# Patient Record
Sex: Male | Born: 1965 | Race: Black or African American | Hispanic: No | Marital: Single | Smoking: Current some day smoker
Health system: Southern US, Community
[De-identification: ages and names within clinical notes are randomized; demographics above are authoritative.]

## PROBLEM LIST (undated history)

## (undated) DIAGNOSIS — R569 Unspecified convulsions: Secondary | ICD-10-CM

## (undated) DIAGNOSIS — K219 Gastro-esophageal reflux disease without esophagitis: Secondary | ICD-10-CM

## (undated) DIAGNOSIS — C801 Malignant (primary) neoplasm, unspecified: Secondary | ICD-10-CM

## (undated) DIAGNOSIS — G4733 Obstructive sleep apnea (adult) (pediatric): Secondary | ICD-10-CM

## (undated) DIAGNOSIS — I10 Essential (primary) hypertension: Secondary | ICD-10-CM

## (undated) DIAGNOSIS — G4730 Sleep apnea, unspecified: Secondary | ICD-10-CM

## (undated) DIAGNOSIS — R05 Cough: Secondary | ICD-10-CM

## (undated) DIAGNOSIS — E785 Hyperlipidemia, unspecified: Secondary | ICD-10-CM

## (undated) DIAGNOSIS — E119 Type 2 diabetes mellitus without complications: Secondary | ICD-10-CM

## (undated) HISTORY — DX: Gastro-esophageal reflux disease without esophagitis: K21.9

## (undated) HISTORY — PX: TESTICLE REMOVAL: SHX68

## (undated) HISTORY — DX: Hyperlipidemia, unspecified: E78.5

## (undated) HISTORY — DX: Type 2 diabetes mellitus without complications: E11.9

## (undated) HISTORY — DX: Unspecified convulsions: R56.9

---
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
## 1998-04-30 ENCOUNTER — Emergency Department (HOSPITAL_COMMUNITY): Admission: EM | Admit: 1998-04-30 | Discharge: 1998-04-30 | Payer: Self-pay | Admitting: Emergency Medicine

## 2009-10-05 ENCOUNTER — Emergency Department (HOSPITAL_COMMUNITY): Admission: EM | Admit: 2009-10-05 | Discharge: 2009-10-05 | Payer: Self-pay | Admitting: Emergency Medicine

## 2017-09-20 ENCOUNTER — Emergency Department (HOSPITAL_COMMUNITY): Payer: Self-pay

## 2017-09-20 ENCOUNTER — Observation Stay (HOSPITAL_COMMUNITY)
Admission: EM | Admit: 2017-09-20 | Discharge: 2017-09-21 | Disposition: A | Discharge status: Home / Self Care | Payer: Self-pay | Attending: Family Medicine | Admitting: Family Medicine

## 2017-09-20 ENCOUNTER — Encounter (HOSPITAL_COMMUNITY): Payer: Self-pay | Admitting: Emergency Medicine

## 2017-09-20 ENCOUNTER — Other Ambulatory Visit: Payer: Self-pay

## 2017-09-20 DIAGNOSIS — G4733 Obstructive sleep apnea (adult) (pediatric): Secondary | ICD-10-CM

## 2017-09-20 DIAGNOSIS — R600 Localized edema: Secondary | ICD-10-CM | POA: Insufficient documentation

## 2017-09-20 DIAGNOSIS — F17210 Nicotine dependence, cigarettes, uncomplicated: Secondary | ICD-10-CM | POA: Insufficient documentation

## 2017-09-20 DIAGNOSIS — R400 Somnolence: Secondary | ICD-10-CM

## 2017-09-20 DIAGNOSIS — Z880 Allergy status to penicillin: Secondary | ICD-10-CM | POA: Insufficient documentation

## 2017-09-20 DIAGNOSIS — F1010 Alcohol abuse, uncomplicated: Secondary | ICD-10-CM

## 2017-09-20 DIAGNOSIS — Z8547 Personal history of malignant neoplasm of testis: Secondary | ICD-10-CM | POA: Insufficient documentation

## 2017-09-20 DIAGNOSIS — R0789 Other chest pain: Principal | ICD-10-CM | POA: Diagnosis present

## 2017-09-20 DIAGNOSIS — I10 Essential (primary) hypertension: Secondary | ICD-10-CM

## 2017-09-20 DIAGNOSIS — Z6841 Body Mass Index (BMI) 40.0 and over, adult: Secondary | ICD-10-CM | POA: Insufficient documentation

## 2017-09-20 DIAGNOSIS — F17200 Nicotine dependence, unspecified, uncomplicated: Secondary | ICD-10-CM

## 2017-09-20 DIAGNOSIS — F1410 Cocaine abuse, uncomplicated: Secondary | ICD-10-CM

## 2017-09-20 DIAGNOSIS — E119 Type 2 diabetes mellitus without complications: Secondary | ICD-10-CM | POA: Insufficient documentation

## 2017-09-20 DIAGNOSIS — E785 Hyperlipidemia, unspecified: Secondary | ICD-10-CM | POA: Insufficient documentation

## 2017-09-20 HISTORY — DX: Somnolence: R40.0

## 2017-09-20 HISTORY — DX: Malignant (primary) neoplasm, unspecified: C80.1

## 2017-09-20 HISTORY — DX: Sleep apnea, unspecified: G47.30

## 2017-09-20 HISTORY — DX: Obstructive sleep apnea (adult) (pediatric): G47.33

## 2017-09-20 HISTORY — DX: Essential (primary) hypertension: I10

## 2017-09-20 HISTORY — DX: Other chest pain: R07.89

## 2017-09-20 LAB — CBC
HEMATOCRIT: 47.9 % (ref 39.0–52.0)
HEMOGLOBIN: 16 g/dL (ref 13.0–17.0)
MCH: 31.3 pg (ref 26.0–34.0)
MCHC: 33.4 g/dL (ref 30.0–36.0)
MCV: 93.7 fL (ref 78.0–100.0)
PLATELETS: 179 10*3/uL (ref 150–400)
RBC: 5.11 MIL/uL (ref 4.22–5.81)
RDW: 13.2 % (ref 11.5–15.5)
WBC: 7.3 10*3/uL (ref 4.0–10.5)

## 2017-09-20 LAB — TSH: TSH: 2.305 u[IU]/mL (ref 0.350–4.500)

## 2017-09-20 LAB — HEPATIC FUNCTION PANEL
ALK PHOS: 86 U/L (ref 38–126)
ALT: 36 U/L (ref 0–44)
AST: 39 U/L (ref 15–41)
Albumin: 3.7 g/dL (ref 3.5–5.0)
BILIRUBIN DIRECT: 0.2 mg/dL (ref 0.0–0.2)
BILIRUBIN INDIRECT: 0.5 mg/dL (ref 0.3–0.9)
TOTAL PROTEIN: 6.7 g/dL (ref 6.5–8.1)
Total Bilirubin: 0.7 mg/dL (ref 0.3–1.2)

## 2017-09-20 LAB — LIPID PANEL
CHOL/HDL RATIO: 4.2 ratio
CHOLESTEROL: 180 mg/dL (ref 0–200)
HDL: 43 mg/dL (ref >40)
LDL Cholesterol: 123 mg/dL — ABNORMAL HIGH (ref 0–99)
Triglycerides: 69 mg/dL (ref <150)
VLDL: 14 mg/dL (ref 0–40)

## 2017-09-20 LAB — HEMOGLOBIN A1C
Hgb A1c MFr Bld: 6.7 % — ABNORMAL HIGH (ref 4.8–5.6)
MEAN PLASMA GLUCOSE: 145.59 mg/dL

## 2017-09-20 LAB — PROTEIN / CREATININE RATIO, URINE
Creatinine, Urine: 109.3 mg/dL
Protein Creatinine Ratio: 0.06 mg/mg{Cre} (ref 0.00–0.15)
TOTAL PROTEIN, URINE: 7 mg/dL

## 2017-09-20 LAB — I-STAT TROPONIN, ED: Troponin i, poc: 0 ng/mL (ref 0.00–0.08)

## 2017-09-20 LAB — BASIC METABOLIC PANEL
Anion gap: 9 (ref 5–15)
BUN: 13 mg/dL (ref 6–20)
CHLORIDE: 109 mmol/L (ref 98–111)
CO2: 22 mmol/L (ref 22–32)
CREATININE: 1.26 mg/dL — AB (ref 0.61–1.24)
Calcium: 8.7 mg/dL — ABNORMAL LOW (ref 8.9–10.3)
GFR calc non Af Amer: >60 mL/min (ref >60)
Glucose, Bld: 108 mg/dL — ABNORMAL HIGH (ref 70–99)
Potassium: 4.2 mmol/L (ref 3.5–5.1)
Sodium: 140 mmol/L (ref 135–145)

## 2017-09-20 LAB — BRAIN NATRIURETIC PEPTIDE: B Natriuretic Peptide: 15.2 pg/mL (ref 0.0–100.0)

## 2017-09-20 LAB — TROPONIN I
Troponin I: <0.03 ng/mL (ref <0.03)
Troponin I: <0.03 ng/mL (ref <0.03)
Troponin I: <0.03 ng/mL (ref <0.03)

## 2017-09-20 LAB — RAPID URINE DRUG SCREEN, HOSP PERFORMED
AMPHETAMINES: NOT DETECTED
BARBITURATES: NOT DETECTED
BENZODIAZEPINES: NOT DETECTED
COCAINE: POSITIVE — AB
Opiates: NOT DETECTED
TETRAHYDROCANNABINOL: NOT DETECTED

## 2017-09-20 LAB — LIPASE, BLOOD: Lipase: 34 U/L (ref 11–51)

## 2017-09-20 LAB — MRSA PCR SCREENING: MRSA by PCR: NEGATIVE

## 2017-09-20 MED ORDER — HYDROCHLOROTHIAZIDE 10 MG/ML ORAL SUSPENSION
6.2500 mg | Freq: Every day | ORAL | Status: DC
  Administered 2017-09-20 – 2017-09-21 (×2): 6.25 mg via ORAL
  Filled 2017-09-20 (×3): qty 1.25

## 2017-09-20 MED ORDER — NITROGLYCERIN 0.4 MG SL SUBL: 0.4000 mg | SUBLINGUAL_TABLET | SUBLINGUAL | Status: DC | PRN

## 2017-09-20 MED ORDER — CALCIUM CARBONATE ANTACID 500 MG PO CHEW
200.0000 mg | CHEWABLE_TABLET | Freq: Two times a day (BID) | ORAL | Status: DC
  Administered 2017-09-20 – 2017-09-21 (×2): 200 mg via ORAL
  Filled 2017-09-20 (×2): qty 1

## 2017-09-20 MED ORDER — HYDRALAZINE HCL 20 MG/ML IJ SOLN
5.0000 mg | INTRAMUSCULAR | Status: DC | PRN
  Administered 2017-09-20: 5 mg via INTRAVENOUS
  Filled 2017-09-20: qty 1

## 2017-09-20 MED ORDER — ADULT MULTIVITAMIN W/MINERALS CH
1.0000 | ORAL_TABLET | Freq: Every day | ORAL | Status: DC
  Administered 2017-09-20 – 2017-09-21 (×2): 1 via ORAL
  Filled 2017-09-20 (×2): qty 1

## 2017-09-20 MED ORDER — FOLIC ACID 1 MG PO TABS
1.0000 mg | ORAL_TABLET | Freq: Every day | ORAL | Status: DC
  Administered 2017-09-20 – 2017-09-21 (×2): 1 mg via ORAL
  Filled 2017-09-20 (×2): qty 1

## 2017-09-20 MED ORDER — VITAMIN B-1 100 MG PO TABS
100.0000 mg | ORAL_TABLET | Freq: Every day | ORAL | Status: DC
  Administered 2017-09-20 – 2017-09-21 (×2): 100 mg via ORAL
  Filled 2017-09-20 (×2): qty 1

## 2017-09-20 MED ORDER — ACETAMINOPHEN 325 MG PO TABS
650.0000 mg | ORAL_TABLET | ORAL | Status: DC | PRN
  Administered 2017-09-20: 650 mg via ORAL
  Filled 2017-09-20: qty 2

## 2017-09-20 MED ORDER — LORAZEPAM 1 MG PO TABS: 1.0000 mg | ORAL_TABLET | Freq: Four times a day (QID) | ORAL | Status: DC | PRN

## 2017-09-20 MED ORDER — ATORVASTATIN CALCIUM 20 MG PO TABS
20.0000 mg | ORAL_TABLET | Freq: Every day | ORAL | Status: DC
  Administered 2017-09-20 – 2017-09-21 (×2): 20 mg via ORAL
  Filled 2017-09-20 (×2): qty 1

## 2017-09-20 MED ORDER — THIAMINE HCL 100 MG/ML IJ SOLN
100.0000 mg | Freq: Every day | INTRAMUSCULAR | Status: DC
  Filled 2017-09-20 (×2): qty 2

## 2017-09-20 MED ORDER — ONDANSETRON HCL 4 MG/2ML IJ SOLN: 4.0000 mg | Freq: Four times a day (QID) | INTRAMUSCULAR | Status: DC | PRN

## 2017-09-20 MED ORDER — ASPIRIN 81 MG PO CHEW
324.0000 mg | CHEWABLE_TABLET | Freq: Once | ORAL | Status: DC
  Filled 2017-09-20: qty 4

## 2017-09-20 MED ORDER — IPRATROPIUM-ALBUTEROL 0.5-2.5 (3) MG/3ML IN SOLN: 3.0000 mL | Freq: Four times a day (QID) | RESPIRATORY_TRACT | Status: DC | PRN

## 2017-09-20 MED ORDER — ACETAMINOPHEN 325 MG PO TABS
650.0000 mg | ORAL_TABLET | Freq: Three times a day (TID) | ORAL | Status: DC
  Administered 2017-09-20 – 2017-09-21 (×3): 650 mg via ORAL
  Filled 2017-09-20 (×3): qty 2

## 2017-09-20 MED ORDER — ASPIRIN EC 325 MG PO TBEC
325.0000 mg | DELAYED_RELEASE_TABLET | Freq: Every day | ORAL | Status: DC
  Administered 2017-09-21: 325 mg via ORAL
  Filled 2017-09-20: qty 1

## 2017-09-20 MED ORDER — ENOXAPARIN SODIUM 40 MG/0.4ML ~~LOC~~ SOLN
40.0000 mg | SUBCUTANEOUS | Status: DC
  Administered 2017-09-20 – 2017-09-21 (×2): 40 mg via SUBCUTANEOUS
  Filled 2017-09-20 (×2): qty 0.4

## 2017-09-20 MED ORDER — NICOTINE 14 MG/24HR TD PT24
14.0000 mg | MEDICATED_PATCH | Freq: Every day | TRANSDERMAL | Status: DC
  Administered 2017-09-20 – 2017-09-21 (×2): 14 mg via TRANSDERMAL
  Filled 2017-09-20 (×2): qty 1

## 2017-09-20 MED ORDER — GI COCKTAIL ~~LOC~~: 30.0000 mL | Freq: Four times a day (QID) | ORAL | Status: DC | PRN

## 2017-09-20 MED ORDER — LORAZEPAM 2 MG/ML IJ SOLN: 1.0000 mg | Freq: Four times a day (QID) | INTRAMUSCULAR | Status: DC | PRN

## 2017-09-20 MED ORDER — OXYCODONE HCL 5 MG PO TABS: 2.5000 mg | ORAL_TABLET | ORAL | Status: DC | PRN

## 2017-09-20 NOTE — H&P (Addendum)
[facility] Admission History and Physical Service Pager: [phone]  Patient name: Dillon Wang Medical record number: [id number] Date of birth: February 02, 1966 Age: 52 y.o. Gender: male  Primary Care Provider: System, Pcp Not In Consultants:  Code Status: full  Chief Complaint: atypical Chest pain  Assessment and Plan: Dillon Wang is a 52 y.o. male presenting with left-sided chest pain. PMH is significant for cocaine use, hypertension, testicular cancer status post orchiectomy without chemotherapy.  Atypical chest pain Chest wall pain is reproducible with palpation following cocaine use.  Vital signs are significant for hypertension up to systolics of 831 and respirations up to 30.  Some coarse breath sounds and lower extremity edema on physical exam chest pain was reproducible on exam with palpation of the left costal margin.  No reported changes on exertion but pain with position change coughing.  EKG and chest x-ray were unremarkable.  First troponin is negative, BNP: 15.  DDX includes: Musculoskeletal pain, vasospasm secondary to cocaine use, NSTEMI, heart failure, pneumonia. Presentation and work-up thus far are most consistent with musculoskeletal pain but would like to monitor on telemetry and get a TTE due to high risk for CAD/heart failure. - admit to Telemetry for obs, Dr. McDiarmid attending - Trend troponins - Repeat EKG - A1c, TSH, lipid panel - Monitor LFTs, bilirubin, lipase - TTE - Monitor vitals, up with assistance  Cocaine/substance use Patient admits to using cocaine frequently last use was last night.  Patient denies other illicit substances. - UDS - Cautious use of beta-blockers  Alcohol use Patient reports drinking one sixpack of beer per day.  Patient reports last drink was last night. -CIWA protocol - Monitor liver enzymes -Thiamine  AKI versus CKD Admission labs show a creatinine of 1.26, baseline unclear.  Creatinine from ED  visit in May/2019 was 1.3.  Based on available data this may well be his baseline. -Monitor creatinine -consider nephrology consult if worsening -Avoid nephrotoxic agents -Avoid NSAIDs if possible -urine protein creatinine ratio  Nicotine use Patient reports smoking half a pack of cigarettes per day. -Nicotine patch -duoneb q6 prn  OSA Patient reports a history of OSA and does have a CPAP at home.  He reports that he has not been using his CPAP recently because his mask is getting old and does not work well. - CPAP use in hospital -d/c summary to suggest new CPAP DME order from pcp  No pcp: no insurance -consult SW for help finding pcp  FEN/GI: Heart healthy Prophylaxis: Lovenox  Disposition: Admit to GenSurg  History of Present Illness:  Dillon Wang is a 52 y.o. male presenting with pain.  He has a previous medical history significant for cocaine use, hypertension testicular cancer status post orchiectomy without chemotherapy.  He is to [place] began to experience stabbing chest pain in his left chest last night following cocaine use.  The pain is described as 10 out of 10 stabbing to his left pec without radiation.  He noticed that it was worse when he leaned forward and while he coughed.  It is not related to exertion and he has not had any food intake since the pain began.  During transportation with EMS to the hospital he was giving a medication significantly helped reduce the pain.  His girlfriend noted that in the past year he has had at least 2 episodes of passing out.  One was certainly related to cocaine/drug use the other is unclear.  She also reports that he has had more  difficulty breathing while lying down for about the past month.  He does have a diagnosis of OSA with a CPAP at home that he does not use because the mask is old.  Girlfriend also pointed out that his lower extremity edema has worsened in the past 1 to 2 months.  Dillon Wang reports reports a cocaine habit of  about $20 today in addition to drinking one sixpack of beer per day and smoking about 1/2 pack of cigarettes per day.  Patient was recently diagnosed with hypertension at an ED visit.  Was prescribed lisinopril to begin taking at home.  He has been inconsistent with taking this new medication does not know if it in fact helps his hypertension.   Review Of Systems: Per HPI with the following additions:   Review of Systems  Constitutional: Positive for malaise/fatigue. Negative for chills, fever and weight loss.  Eyes: Negative for discharge and redness.  Respiratory: Positive for cough and shortness of breath.   Cardiovascular: Positive for chest pain, orthopnea and leg swelling. Negative for palpitations.  Gastrointestinal: Negative for abdominal pain, blood in stool, constipation, diarrhea, nausea and vomiting.  Genitourinary: Negative for dysuria.  Musculoskeletal: Negative for myalgias.  Neurological: Positive for loss of consciousness. Negative for tremors.    Patient Active Problem List   Diagnosis Date Noted  . Atypical chest pain 09/20/2017    Past Medical History: Past Medical History:  Diagnosis Date  . Cancer [facility])    prostate  . Hypertension   . Sleep apnea     Past Surgical History: History reviewed. No pertinent surgical history.  Social History: Social History   Tobacco Use  . Smoking status: Current Every Day Smoker  . Smokeless tobacco: Never Used  Substance Use Topics  . Alcohol use: Yes    Comment: daily  . Drug use: Yes    Types: Cocaine    Comment: yesterday (09/19/17   Additional social history: Patient was interviewed with his girlfriend of 10 months Please also refer to relevant sections of EMR.  Family History: No family history on file. No family history of CAD or heart failure  Allergies and Medications: Allergies  Allergen Reactions  . Penicillins    No current facility-administered medications on file prior to encounter.     Current Outpatient Medications on File Prior to Encounter  Medication Sig Dispense Refill  . lisinopril (PRINIVIL,ZESTRIL) 20 MG tablet Take 20 mg by mouth daily.      Objective: BP (!) 175/95   Pulse 77   Temp 97.7 F (36.5 C) (Temporal)   Resp 16   Ht 5\' 9"  (1.753 m)   Wt 280 lb (127 kg)   SpO2 92%   BMI 41.35 kg/m  Exam: Physical Exam  Constitutional: He appears well-developed and well-nourished.  Non-toxic appearance. He appears distressed.  Patient did appear fatigued, frequently closing his eyes during the interview and leaving long pauses during which his girlfriend would complete his answers or add pertinent information.  HENT:  Head: Normocephalic and atraumatic.  Cardiovascular: Normal rate and regular rhythm. Exam reveals no gallop, no S3 and no S4.  No murmur heard. Pulmonary/Chest: Effort normal. He has wheezes.  Course breath sounds noted on exam in the middle and lower lung fields bilaterally.    Abdominal: Bowel sounds are normal. He exhibits distension. There is no rebound and no guarding.  Significant tenderness patient in left upper quadrant/left subcostal border.  (This is the location of his "chest pain")  Musculoskeletal:  Right lower leg: He exhibits edema. He exhibits no tenderness.       Left lower leg: He exhibits edema. He exhibits no tenderness.  Lymphadenopathy:    He has cervical adenopathy.  Neurological:  Patient was alert and oriented but did appear fatigued.    Skin: Skin is warm and dry. No rash noted. Nails show no clubbing.     Labs and Imaging: CBC BMET  Recent Labs  Lab 09/20/17 0650  WBC 7.3  HGB 16.0  HCT 47.9  PLT 179   Recent Labs  Lab 09/20/17 0650  NA 140  K 4.2  CL 109  CO2 22  BUN 13  CREATININE 1.26*  GLUCOSE 108*  CALCIUM 8.7*     Dg Chest Portable 1 View  Result Date: 09/20/2017 CLINICAL DATA:  Shortness of breath and chest pain. EXAM: PORTABLE CHEST 1 VIEW COMPARISON:  None. FINDINGS: Lungs are  adequately inflated without focal consolidation or effusion. Cardiomediastinal silhouette is within normal. There are degenerative changes of the spine. IMPRESSION: No active disease. Electronically Signed   By: Marin Olp M.D.   On: 09/20/2017 07:36    Matilde Haymaker, MD 09/20/2017, 10:13 AM PGY-1, [place] Intern pager: [phone], text pages welcome  FPTS Upper-Level Resident Addendum   I have independently interviewed and examined the patient. I have discussed the above with the original author and agree with their documentation. My edits for correction/addition/clarification are in blue. Please see also any attending notes.    Sherene Sires, DO PGY-2, [facility] 09/20/2017 2:59 PM  FPTS Service pager: [phone] (text pages welcome through [facility])

## 2017-09-20 NOTE — ED Provider Notes (Signed)
[facility] EMERGENCY DEPARTMENT Provider Note   CSN: [id number] Arrival date & time: 09/20/17  7564     History   Chief Complaint Chief Complaint  Patient presents with  . Chest Pain    HPI Dillon Wang is a 52 y.o. male.  HPI  52 year old male presents with chest pain.  Chest pain started this morning around 4 AM and has been accompanied with a cough.  Whenever he coughs he has sharp chest pain under his left breast.  He is also been having shortness of breath, orthopnea, and weight gain with leg swelling and abdominal distention for couple months.  He states he has a history of hypertension and recently started lisinopril that he supposed be taking.  He also abuses alcohol and cocaine, most recently doing cocaine yesterday.  The chest pain does not radiate.  He has some chronic back pain but no new back pain with this today.  Past Medical History:  Diagnosis Date  . Cancer [facility])    prostate  . Hypertension   . Sleep apnea     Patient Active Problem List   Diagnosis Date Noted  . Atypical chest pain 09/20/2017    History reviewed. No pertinent surgical history.      Home Medications    Prior to Admission medications   Medication Sig Start Date End Date Taking? Authorizing Provider  lisinopril (PRINIVIL,ZESTRIL) 20 MG tablet Take 20 mg by mouth daily. 07/14/17  Yes [provider]    Family History No family history on file.  Social History Social History   Tobacco Use  . Smoking status: Current Every Day Smoker  . Smokeless tobacco: Never Used  Substance Use Topics  . Alcohol use: Yes    Comment: daily  . Drug use: Yes    Types: Cocaine    Comment: yesterday (09/19/17     Allergies   Penicillins   Review of Systems Review of Systems  Respiratory: Positive for cough and shortness of breath.   Cardiovascular: Positive for chest pain and leg swelling.  Gastrointestinal: Positive for abdominal distention. Negative for  abdominal pain.  Musculoskeletal: Positive for back pain (chronic, unchanged).  All other systems reviewed and are negative.    Physical Exam Updated Vital Signs BP (!) 175/95   Pulse 77   Temp 97.7 F (36.5 C) (Temporal)   Resp 16   Ht 5\' 9"  (1.753 m)   Wt 127 kg (280 lb)   SpO2 92%   BMI 41.35 kg/m   Physical Exam  Constitutional: He is oriented to person, place, and time. He appears well-developed and well-nourished.  HENT:  Head: Normocephalic and atraumatic.  Right Ear: External ear normal.  Left Ear: External ear normal.  Nose: Nose normal.  Eyes: Right eye exhibits no discharge. Left eye exhibits no discharge.  Neck: Neck supple.  Cardiovascular: Normal rate, regular rhythm and normal heart sounds.  Pulmonary/Chest: Effort normal and breath sounds normal. He exhibits tenderness (mild inferior to the left breast).  Mild scattered rales and rhonchi, mostly at the bases  Abdominal: Soft. There is no tenderness.  Musculoskeletal:       Right lower leg: He exhibits edema (mild pitting edema to lower leg and foot).       Left lower leg: He exhibits edema (mild pitting edema to lower leg and foot).  Neurological: He is alert and oriented to person, place, and time.  Skin: Skin is warm and dry.  Nursing note and vitals reviewed.  ED Treatments / Results  Labs (all labs ordered are listed, but only abnormal results are displayed) Labs Reviewed  BASIC METABOLIC PANEL - Abnormal; Notable for the following components:      Result Value   Glucose, Bld 108 (*)    Creatinine, Ser 1.26 (*)    Calcium 8.7 (*)    All other components within normal limits  CBC  BRAIN NATRIURETIC PEPTIDE  HEPATIC FUNCTION PANEL  I-STAT TROPONIN, ED    EKG EKG Interpretation  Date/Time:  Sunday September 20 2017 06:41:12 EDT Ventricular Rate:  90 PR Interval:    QRS Duration: 81 QT Interval:  371 QTC Calculation: 454 R Axis:   6 Text Interpretation:  Sinus rhythm Borderline  repolarization abnormality Confirmed by Dory Horn) on 09/20/2017 6:48:40 AM Also confirmed by Randal Buba, April ([id number]), editor Lynder Parents [phone])  on 09/20/2017 8:59:34 AM   Radiology Dg Chest Portable 1 View  Result Date: 09/20/2017 CLINICAL DATA:  Shortness of breath and chest pain. EXAM: PORTABLE CHEST 1 VIEW COMPARISON:  None. FINDINGS: Lungs are adequately inflated without focal consolidation or effusion. Cardiomediastinal silhouette is within normal. There are degenerative changes of the spine. IMPRESSION: No active disease. Electronically Signed   By: Marin Olp M.D.   On: 09/20/2017 07:36    Procedures Procedures (including critical care time)  Medications Ordered in ED Medications  aspirin chewable tablet 324 mg (324 mg Oral Not Given 09/20/17 0744)  nitroGLYCERIN (NITROSTAT) SL tablet 0.4 mg (has no administration in time range)     Initial Impression / Assessment and Plan / ED Course  I have reviewed the triage vital signs and the nursing notes.  Pertinent labs & imaging results that were available during my care of the patient were reviewed by me and considered in my medical decision making (see chart for details).     Patient's initial workup is negative. However he has multiple risk factors for cardiac disease. Initial BNP negative, still probably needs echo based on symptoms. No PCP. HEART score is 4. Doubt PE, dissection. Will admit to [facility] for ACS r/o  Final Clinical Impressions(s) / ED Diagnoses   Final diagnoses:  Atypical chest pain    ED Discharge Orders    None       Sherwood Gambler, MD 09/20/17 [phone]

## 2017-09-20 NOTE — ED Triage Notes (Signed)
Pt transported by [facility] from home, pt began having pain in L chest when he layed down attempting to go to sleep @ 0600. Sharp stabbing, worse with cough. Per wife pt did not have cough before this am.  Nitro x 3 and ASA  Given by EMS.  Pt states pain down from 10 to 8.  Pt reports he has been out of town and has not had Lisinopril x 5 days

## 2017-09-20 NOTE — ED Notes (Signed)
Per family at bedside pt has no health insurance and has not been compliant with medications and did not follow up and have Echo as recommended by MD.  Pt reports using cocaine yesterday as well as everyday smoker. Lower extremity edema noted.

## 2017-09-20 NOTE — Discharge Summary (Signed)
[facility] Discharge Summary  Patient name: Dillon Wang Medical record number: [id number] Date of birth: 1965-06-12 Age: 52 y.o. Gender: male Date of Admission: 09/20/2017  Date of Discharge: 09/21/2017 Admitting Physician: Blane Ohara McDiarmid, MD  Primary Care Provider: System, Pcp Not In Consultants: none  Indication for Hospitalization: chest pain   Discharge Diagnoses/Problem List:  Patient Active Problem List   Diagnosis Date Noted  . Atypical chest pain 09/20/2017  . Obstructive sleep apnea 09/20/2017  . Daytime somnolence 09/20/2017  . Morbid obesity ([place]) 09/20/2017  . Hypertension   . Cocaine abuse ([place])   . Tobacco use disorder   . Alcohol abuse      Disposition: DC home  Discharge Condition: Stable  Discharge Exam:  General: Alert and cooperative and appears to be in no acute distress HEENT: Neck non-tender without lymphadenopathy, masses or thyromegaly Cardio: Normal A1 and S2, no S3 or S4. Regular rate and rhythm. No murmurs or rubs.   Pulm: breathing comfortably ORA, crackles in middle and lower quadrants bilaterally. Normal respiratory effort. Abdomen: Bowel sounds normal. Abdomen soft and non-tender.  Extremities: 1+ LE edema bilaterally. Warm/ well perfused.  Strong radial pulses. Neuro: Cranial nerves grossly intact  Brief Hospital Course:  Dillon Wang is a 52 y.o. male who presented to the ED on 7/21 with chest pain.  He has a previous medical history significant for cocaine use, hypertension testicular cancer status post orchiectomy without chemotherapy.  Workup for ACS was unremarkable for cardiac etiology of chest pain.  X-ray, EKG and troponins were unremarkable. He was monitored on telemetry overnight without event.  One 7/22, he was found to be in stable condition without chest pain or palpitations and was discharged home with follow up with PCP.  During his time in the hospital Dillon Wang was found to have an A1c of 6.7  and a total cholesterol of 180.  He was started on a high intensity statin (atorvastatin 40mg ) and was encouraged to make lifestyle changes for better control of his blood glucose. He was encouraged to follow up with his new PCP for further management of these new diagnoses.  On admission, Dillon Wang noted that he had recently been prescribed lisinopril for his hypertension but had not been taking it. In the inpatient setting, he was given HCTZ for HTN control.  He was sent home with HCTZ.   Issues for Follow Up:  1. Follow-up with PCP regarding the appropriate control of hypertension.  Please also address the new diagnoses of diabetes mellitus type 2 and hyperlipidemia.  Please ensure that patient receives appropriate education on these new diagnoses and medications.  Significant Procedures: none  Significant Labs and Imaging:  Recent Labs  Lab 09/20/17 0650 09/21/17 0258  WBC 7.3 6.7  HGB 16.0 15.8  HCT 47.9 48.0  PLT 179 180   Recent Labs  Lab 09/20/17 0650 09/21/17 0258  NA 140 136  K 4.2 3.9  CL 109 101  CO2 22 26  GLUCOSE 108* 127*  BUN 13 11  CREATININE 1.26* 1.34*  CALCIUM 8.7* 9.2  ALKPHOS 86  --   AST 39  --   ALT 36  --   ALBUMIN 3.7  --     Results for orders placed or performed during the hospital encounter of 09/20/17 (from the past 24 hour(s))  Protein / creatinine ratio, urine     Status: None   Collection Time: 09/20/17  7:42 PM  Result Value Ref Range  Creatinine, Urine 109.30 mg/dL   Total Protein, Urine 7 mg/dL   Protein Creatinine Ratio 0.06 0.00 - 0.15 mg/mg[Cre]  Troponin I (q 6hr x 3)     Status: None   Collection Time: 09/20/17 10:08 PM  Result Value Ref Range   Troponin I <0.03 <0.03 ng/mL  CBC     Status: None   Collection Time: 09/21/17  2:58 AM  Result Value Ref Range   WBC 6.7 4.0 - 10.5 K/uL   RBC 5.18 4.22 - 5.81 MIL/uL   Hemoglobin 15.8 13.0 - 17.0 g/dL   HCT 48.0 39.0 - 52.0 %   MCV 92.7 78.0 - 100.0 fL   MCH 30.5 26.0 - 34.0  pg   MCHC 32.9 30.0 - 36.0 g/dL   RDW 13.0 11.5 - 15.5 %   Platelets 180 150 - 400 K/uL  Basic metabolic panel     Status: Abnormal   Collection Time: 09/21/17  2:58 AM  Result Value Ref Range   Sodium 136 135 - 145 mmol/L   Potassium 3.9 3.5 - 5.1 mmol/L   Chloride 101 98 - 111 mmol/L   CO2 26 22 - 32 mmol/L   Glucose, Bld 127 (H) 70 - 99 mg/dL   BUN 11 6 - 20 mg/dL   Creatinine, Ser 1.34 (H) 0.61 - 1.24 mg/dL   Calcium 9.2 8.9 - 10.3 mg/dL   GFR calc non Af Amer >60 >60 mL/min   GFR calc Af Amer >60 >60 mL/min   Anion gap 9 5 - 15     Results/Tests Pending at Time of Discharge: none  Discharge Medications:  Allergies as of 09/21/2017      Reactions   Penicillins       Medication List    STOP taking these medications   lisinopril 20 MG tablet Commonly known as:  PRINIVIL,ZESTRIL     TAKE these medications   aspirin 81 MG chewable tablet Chew 1 tablet (81 mg total) by mouth daily at 6 (six) AM.   atorvastatin 40 MG tablet Commonly known as:  LIPITOR Take 1 tablet (40 mg total) by mouth daily.   hydrochlorothiazide 12.5 MG capsule Commonly known as:  MICROZIDE Take 1 capsule (12.5 mg total) by mouth daily.       Discharge Instructions: Please refer to Patient Instructions section of EMR for full details.  Patient was counseled important signs and symptoms that should prompt return to medical care, changes in medications, dietary instructions, activity restrictions, and follow up appointments.   Follow-Up Appointments: Follow-up Information    [facility] Follow up on 10/22/2017.   Why:  10:10 for hospital follow up Contact information: [place] [id number] [facility] Follow up on .   Why:  you can use the pharmacy for medication asst. Contact information: [place] [address] [phone]           Matilde Haymaker, MD 09/21/2017, 5:25 PM PGY-1, [place]

## 2017-09-21 ENCOUNTER — Other Ambulatory Visit (HOSPITAL_COMMUNITY): Payer: Self-pay

## 2017-09-21 LAB — CBC
HCT: 48 % (ref 39.0–52.0)
HEMOGLOBIN: 15.8 g/dL (ref 13.0–17.0)
MCH: 30.5 pg (ref 26.0–34.0)
MCHC: 32.9 g/dL (ref 30.0–36.0)
MCV: 92.7 fL (ref 78.0–100.0)
Platelets: 180 10*3/uL (ref 150–400)
RBC: 5.18 MIL/uL (ref 4.22–5.81)
RDW: 13 % (ref 11.5–15.5)
WBC: 6.7 10*3/uL (ref 4.0–10.5)

## 2017-09-21 LAB — BASIC METABOLIC PANEL
Anion gap: 9 (ref 5–15)
BUN: 11 mg/dL (ref 6–20)
CHLORIDE: 101 mmol/L (ref 98–111)
CO2: 26 mmol/L (ref 22–32)
Calcium: 9.2 mg/dL (ref 8.9–10.3)
Creatinine, Ser: 1.34 mg/dL — ABNORMAL HIGH (ref 0.61–1.24)
GFR calc Af Amer: >60 mL/min (ref >60)
GFR calc non Af Amer: >60 mL/min (ref >60)
GLUCOSE: 127 mg/dL — AB (ref 70–99)
POTASSIUM: 3.9 mmol/L (ref 3.5–5.1)
Sodium: 136 mmol/L (ref 135–145)

## 2017-09-21 LAB — HIV ANTIBODY (ROUTINE TESTING W REFLEX): HIV SCREEN 4TH GENERATION: NONREACTIVE

## 2017-09-21 MED ORDER — ASPIRIN 81 MG PO CHEW: 81.0000 mg | CHEWABLE_TABLET | Freq: Every day | ORAL | 0 refills | Status: DC

## 2017-09-21 MED ORDER — HYDROCHLOROTHIAZIDE 12.5 MG PO CAPS: 12.5000 mg | ORAL_CAPSULE | Freq: Every day | ORAL | 0 refills | Status: DC

## 2017-09-21 MED ORDER — ATORVASTATIN CALCIUM 20 MG PO TABS: 20.0000 mg | ORAL_TABLET | Freq: Every day | ORAL | 0 refills | Status: DC

## 2017-09-21 MED ORDER — ATORVASTATIN CALCIUM 40 MG PO TABS: 40.0000 mg | ORAL_TABLET | Freq: Every day | ORAL | 0 refills | Status: DC

## 2017-09-21 NOTE — Clinical Social Work Note (Signed)
CSW acknowledges consult that patient needs a PCP and medication assistance. Please consult RNCM for these needs.  CSW signing off. Consult again if any social work needs arise.  Dillon Wang, [place]

## 2017-09-21 NOTE — Discharge Instructions (Signed)
Please keep your appointment to establish care with [facility] on 8/22 at 10:00am.  Chest Wall Pain Chest wall pain is pain in or around the bones and muscles of your chest. Sometimes, an injury causes this pain. Sometimes, the cause may not be known. This pain may take several weeks or longer to get better. Follow these instructions at home: Pay attention to any changes in your symptoms. Take these actions to help with your pain:  Rest as told by your health care provider.  Avoid activities that cause pain. These include any activities that use your chest muscles or your abdominal and side muscles to lift heavy items.  If directed, apply ice to the painful area: ? Put ice in a plastic bag. ? Place a towel between your skin and the bag. ? Leave the ice on for 20 minutes, 2-3 times per day.  Take over-the-counter and prescription medicines only as told by your health care provider.  Do not use tobacco products, including cigarettes, chewing tobacco, and e-cigarettes. If you need help quitting, ask your health care provider.  Keep all follow-up visits as told by your health care provider. This is important.  Contact a health care provider if:  You have a fever.  Your chest pain becomes worse.  You have new symptoms. Get help right away if:  You have nausea or vomiting.  You feel sweaty or light-headed.  You have a cough with phlegm (sputum) or you cough up blood.  You develop shortness of breath. This information is not intended to replace advice given to you by your health care provider. Make sure you discuss any questions you have with your health care provider. Document Released: 02/17/2005 Document Revised: 06/28/2015 Document Reviewed: 05/15/2014 Elsevier Interactive Patient Education  [employer].

## 2017-09-21 NOTE — Care Management Note (Signed)
Case Management Note  Patient Details  Name: Dillon Wang MRN: [id number] Date of Birth: Jun 20, 1965  Subjective/Objective:  From home, pta indep, NCM scheduled a follow up apt for patient at the [facility] for 8/22 at 10:10. NCM gave patient the brochure [facility]. [facility] can use the [facility] for medication ast.                   Action/Plan: DC home when ready.   Expected Discharge Date:  09/21/17               Expected Discharge Plan:  Home/Self Care  In-House Referral:     Discharge planning Services  CM Consult, [facility], Follow-up appt scheduled, Medication Assistance  Post Acute Care Choice:    Choice offered to:     DME Arranged:    DME Agency:     HH Arranged:    HH Agency:     Status of Service:  Completed, signed off  If discussed at [employer], dates discussed:    Additional Comments:  Zenon Mayo, RN 09/21/2017, 12:12 PM

## 2017-09-21 NOTE — Progress Notes (Signed)
Family Medicine Teaching Service Daily Progress Note Intern Pager: [phone]  Patient name: Dillon Wang Medical record number: [id number] Date of birth: 02/03/1966 Age: 52 y.o. Gender: male  Primary Care Provider: System, Pcp Not In Consultants: none Code Status: full  Pt Overview and Major Events to Date:  7/21 admission for chest pain  Assessment and Plan: Dillon Wang is a 52 y.o. male presenting with left-sided chest pain. PMH is significant for cocaine use, hypertension, testicular cancer status post orchiectomy without chemotherapy.  Atypical chest pain - ACS ruled out EKG and chest x-ray were unremarkable.  Troponins negative, BNP: 15.   Repeat EKG in the morning was unremarkable to unremarkable telemetry overnight. Presentation and work-up thus far are most consistent with musculoskeletal pain  Cocaine/substance use Patient admits to using cocaine frequently last use was last night.  Patient denies other illicit substances. - UDS - positive for cocaine - Cautious use of beta-blockers  Alcohol use Patient reports drinking one sixpack of beer per day.  Patient reports last drink was last night. -CIWA protocol - Monitor liver enzymes -Thiamine  AKI versus CKD Admission labs show a creatinine of 1.26, baseline unclear.  Creatinine from ED visit in May/2019 was 1.3.  Based on available data this may well be his baseline. -Monitor creatinine 1.3 (7/22) -Avoid nephrotoxic agents -Avoid NSAIDs if possible  Nicotine use Patient reports smoking half a pack of cigarettes per day. -Nicotine patch -duoneb q6 prn  DMT2 PT was found to have an A1c of 6.7 on admission.  Pt was made aware that he has a new diagnosis of diabetes.  Will encourage lifestyle modification for now and f/u with PCP. - lifestyle modification.  HLD Pt was found to have a total cholesterol of 180 and a LDL of 123. -We will start patient on Lipitor 7/21 - f/u with PCP  OSA Patient reports a  history of OSA and does have a CPAP at home.  He reports that he has not been using his CPAP recently because his mask is getting old and does not work well. - CPAP use in hospital -d/c summary to suggest new CPAP DME order from pcp  No pcp: no insurance -consult SW for help finding pcp - care management scheduled appointment with new PCP  FEN/GI: Heart healthy Prophylaxis: Lovenox   Disposition: DC home  Subjective:  Dillon Wang had no new complaints this am. Spoke with him about his new results regarding his A1c and hyperlipidemia.  Answered questions related to new dx of T2DM and HLD.   Objective: Temp:  [97.7 F (36.5 C)-98.9 F (37.2 C)] 98 F (36.7 C) (07/22 0428) Pulse Rate:  [70-93] 74 (07/22 0428) Resp:  [14-31] 28 (07/22 0428) BP: (145-188)/(84-111) 160/90 (07/22 0428) SpO2:  [91 %-98 %] 94 % (07/22 0428) Weight:  [280 lb (127 kg)] 280 lb (127 kg) (07/21 1275) Physical Exam: General: Alert and cooperative and appears to be in no acute distress HEENT: Neck non-tender without lymphadenopathy, masses or thyromegaly Cardio: Normal A1 and S2, no S3 or S4. Rhythm is regular. No murmurs or rubs.   Pulm: breathing comfortably ORA, crackles in middle and lower quadrants bilaterally. Normal respiratory effort. Abdomen: Bowel sounds normal. Abdomen soft and non-tender.  Extremities: 1+ LE edema bilaterally. Warm/ well perfused.  Strong radial pulses. Neuro: Cranial nerves grossly intact  Laboratory: Recent Labs  Lab 09/20/17 0650 09/21/17 0258  WBC 7.3 6.7  HGB 16.0 15.8  HCT 47.9 48.0  PLT 179 180   Recent  Labs  Lab 09/20/17 0650 09/21/17 0258  NA 140 136  K 4.2 3.9  CL 109 101  CO2 22 26  BUN 13 11  CREATININE 1.26* 1.34*  CALCIUM 8.7* 9.2  PROT 6.7  --   BILITOT 0.7  --   ALKPHOS 86  --   ALT 36  --   AST 39  --   GLUCOSE 108* 127*      Imaging/Diagnostic Tests: Dg Chest Portable 1 View  Result Date: 09/20/2017 CLINICAL DATA:  Shortness of  breath and chest pain. EXAM: PORTABLE CHEST 1 VIEW COMPARISON:  None. FINDINGS: Lungs are adequately inflated without focal consolidation or effusion. Cardiomediastinal silhouette is within normal. There are degenerative changes of the spine. IMPRESSION: No active disease. Electronically Signed   By: Marin Olp M.D.   On: 09/20/2017 07:36     Matilde Haymaker, MD 09/21/2017, 5:43 AM PGY-1, [place] Intern pager: [phone], text pages welcome

## 2017-09-21 NOTE — Progress Notes (Signed)
Pt d/c home via wheelchair. Pt took all belongings (clothes) with him. Discharge instructions given, answered all questions/ reminded him about a follow up apt at the [facility].

## 2017-10-22 ENCOUNTER — Ambulatory Visit (INDEPENDENT_AMBULATORY_CARE_PROVIDER_SITE_OTHER): Payer: Self-pay | Admitting: Physician Assistant

## 2017-10-22 ENCOUNTER — Encounter (INDEPENDENT_AMBULATORY_CARE_PROVIDER_SITE_OTHER): Payer: Self-pay | Admitting: Physician Assistant

## 2017-10-22 ENCOUNTER — Other Ambulatory Visit: Payer: Self-pay

## 2017-10-22 ENCOUNTER — Other Ambulatory Visit (HOSPITAL_COMMUNITY)
Admission: RE | Admit: 2017-10-22 | Discharge: 2017-10-22 | Disposition: A | Discharge status: Home / Self Care | Payer: Self-pay | Source: Ambulatory Visit | Attending: Physician Assistant | Admitting: Physician Assistant

## 2017-10-22 VITALS — BP 125/87 | HR 102 | Temp 98.3°F | Ht 69.0 in | Wt 278.6 lb

## 2017-10-22 DIAGNOSIS — R569 Unspecified convulsions: Secondary | ICD-10-CM

## 2017-10-22 DIAGNOSIS — R05 Cough: Secondary | ICD-10-CM

## 2017-10-22 DIAGNOSIS — R059 Cough, unspecified: Secondary | ICD-10-CM

## 2017-10-22 DIAGNOSIS — R300 Dysuria: Secondary | ICD-10-CM

## 2017-10-22 DIAGNOSIS — E119 Type 2 diabetes mellitus without complications: Secondary | ICD-10-CM

## 2017-10-22 DIAGNOSIS — Z114 Encounter for screening for human immunodeficiency virus [HIV]: Secondary | ICD-10-CM

## 2017-10-22 DIAGNOSIS — I10 Essential (primary) hypertension: Secondary | ICD-10-CM

## 2017-10-22 LAB — POCT URINALYSIS DIPSTICK
Bilirubin, UA: NEGATIVE
Glucose, UA: POSITIVE — AB
KETONES UA: NEGATIVE
Leukocytes, UA: NEGATIVE
Nitrite, UA: NEGATIVE
PH UA: 6 (ref 5.0–8.0)
Protein, UA: POSITIVE — AB
Spec Grav, UA: 1.025 (ref 1.010–1.025)
UROBILINOGEN UA: 0.2 U/dL

## 2017-10-22 MED ORDER — AMLODIPINE BESYLATE 10 MG PO TABS: 10.0000 mg | ORAL_TABLET | Freq: Every day | ORAL | 1 refills | Status: DC

## 2017-10-22 MED ORDER — ALBUTEROL SULFATE HFA 108 (90 BASE) MCG/ACT IN AERS: 2.0000 | INHALATION_SPRAY | RESPIRATORY_TRACT | 0 refills | Status: DC | PRN

## 2017-10-22 MED ORDER — NAPROXEN 500 MG PO TABS: 500.0000 mg | ORAL_TABLET | Freq: Two times a day (BID) | ORAL | 0 refills | Status: DC

## 2017-10-22 MED ORDER — HYDROCHLOROTHIAZIDE 25 MG PO TABS: 25.0000 mg | ORAL_TABLET | Freq: Every day | ORAL | 1 refills | Status: DC

## 2017-10-22 MED ORDER — METFORMIN HCL ER 500 MG PO TB24: 500.0000 mg | ORAL_TABLET | Freq: Every day | ORAL | 1 refills | Status: DC

## 2017-10-22 NOTE — Progress Notes (Signed)
Subjective:  Patient ID: Dillon Wang, male    DOB: [birth year]  Age: 52 y.o. MRN: [id number]  CC: hospital f/u   HPI Dillon Wang is a 52 y.o. male with a medical history of testicular cancer, HTN, PND, OSA, and cocaine abuse presents as a new patient on hospital f/u for cough and syncope. ED visit four days ago. ED notes state CXR and CT of head were unremarkable, however CT head is not seen upon Epic review. Pt  Labs unremarkable except for positive cocaine. Patient continues with cough. Says he was prescribed Levofloxacin but has not filled prescription yet. Also had his Lisinopril increased from 20 mg to 40 mg. Started Lisinopril one month ago.     Also complains of dysuria since approximately one week ago. Attributed to  his medications. Endorses some chronic testicular pain attributed to testicular cancer. Does not endorse urinary frequency, hematuria, penile drainage, testicular swelling. A1c 6.7%.on 09/20/17 found during ED visit. Glucose positive in UA today.       Outpatient Medications Prior to Visit  Medication Sig Dispense Refill  . aspirin 81 MG chewable tablet Chew 1 tablet (81 mg total) by mouth daily at 6 (six) AM. 30 tablet 0  . benzonatate (TESSALON) 100 MG capsule Take 100 mg by mouth every 8 (eight) hours as needed.    Marland Kitchen atorvastatin (LIPITOR) 40 MG tablet Take 1 tablet (40 mg total) by mouth daily. 30 tablet 0  . hydrochlorothiazide (MICROZIDE) 12.5 MG capsule Take 1 capsule (12.5 mg total) by mouth daily. 30 capsule 0   No facility-administered medications prior to visit.      ROS Review of Systems  Constitutional: Negative for chills, fever and malaise/fatigue.  Eyes: Negative for blurred vision.  Respiratory: Positive for cough. Negative for shortness of breath.   Cardiovascular: Negative for chest pain and palpitations.  Gastrointestinal: Negative for abdominal pain and nausea.  Genitourinary: Positive for dysuria. Negative for hematuria.  Musculoskeletal:  Negative for joint pain and myalgias.  Skin: Negative for rash.  Neurological: Negative for tingling and headaches.  Psychiatric/Behavioral: Negative for depression. The patient is not nervous/anxious.     Objective:  Ht 5\' 9"  (1.753 m)   Wt 278 lb 9.6 oz (126.4 kg)   BMI 41.14 kg/m   Vitals:   10/22/17 0924  BP: 125/87  Pulse: (!) 102  Temp: 98.3 F (36.8 C)  TempSrc: Oral  SpO2: 97%  Weight: 278 lb 9.6 oz (126.4 kg)  Height: 5\' 9"  (1.753 m)      Physical Exam  Constitutional: He is oriented to person, place, and time.  Well developed, well nourished, NAD, polite  HENT:  Head: Normocephalic and atraumatic.  Eyes: No scleral icterus.  Neck: Normal range of motion. Neck supple. No thyromegaly present.  Cardiovascular: Normal rate, regular rhythm and normal heart sounds.  Pulmonary/Chest: Effort normal and breath sounds normal. No respiratory distress.  Occasional cough  Abdominal: Soft. Bowel sounds are normal. There is no tenderness.  Musculoskeletal: He exhibits no edema.  Neurological: He is alert and oriented to person, place, and time.  Skin: Skin is warm and dry. No rash noted. No erythema. No pallor.  Psychiatric: He has a normal mood and affect. His behavior is normal. Thought content normal.  Vitals reviewed.    Assessment & Plan:   1. Cough - Advised to stop Lisinopril - Advised to begin Levaquin  2. Type 2 diabetes mellitus without complication, without long-term current use of insulin (HCC) - A1c 6.7%  one month ago - Begin Metformin 500 mg XR qam, x90 days, #90, one refill  3. Dysuria - Urinalysis Dipstick glucose 500 - Urine cytology ancillary only  4. Hypertension, unspecified type - STOP lisinopril could be contributing to cough - Begin hydrochlorothiazide (HYDRODIURIL) 25 MG tablet; Take 1 tablet (25 mg total) by mouth daily. Take on tablet in the morning.  Dispense: 90 tablet; Refill: 1 - Begin amLODipine (NORVASC) 10 MG tablet; Take 1  tablet (10 mg total) by mouth daily.  Dispense: 90 tablet; Refill: 1  5. Screening for HIV (human immunodeficiency virus) - HIV antibody (with reflex)  6. Seizures ([place]) - Single episode in his lifetime. No medication needed at this point. Advised to abstain from driving or activities that may cause harm to himself or others if he has another seizure.  - Will need to complete CAFA for neurology referral  - Advised not to drive or participate in any dangerous activities that may harm him or others in case of another seizure   Meds ordered this encounter  Medications  . hydrochlorothiazide (HYDRODIURIL) 25 MG tablet    Sig: Take 1 tablet (25 mg total) by mouth daily. Take on tablet in the morning.    Dispense:  90 tablet    Refill:  1    Order Specific Question:   Supervising Provider    Answer:   Charlott Rakes [[id number]]  . amLODipine (NORVASC) 10 MG tablet    Sig: Take 1 tablet (10 mg total) by mouth daily.    Dispense:  90 tablet    Refill:  1    Order Specific Question:   Supervising Provider    Answer:   Charlott Rakes [[id number]]  . metFORMIN (GLUCOPHAGE-XR) 500 MG 24 hr tablet    Sig: Take 1 tablet (500 mg total) by mouth daily with breakfast.    Dispense:  90 tablet    Refill:  1    Order Specific Question:   Supervising Provider    Answer:   Charlott Rakes [[id number]]  . albuterol (PROVENTIL HFA;VENTOLIN HFA) 108 (90 Base) MCG/ACT inhaler    Sig: Inhale 2 puffs into the lungs every 4 (four) hours as needed for wheezing or shortness of breath.    Dispense:  1 Inhaler    Refill:  0    Order Specific Question:   Supervising Provider    Answer:   Charlott Rakes [[id number]]  . naproxen (NAPROSYN) 500 MG tablet    Sig: Take 1 tablet (500 mg total) by mouth 2 (two) times daily with a meal.    Dispense:  30 tablet    Refill:  0    Order Specific Question:   Supervising Provider    Answer:   Charlott Rakes [[id number]]    Follow-up: Return in about 6 weeks (around 12/03/2017) for HTN, DM,  cough.   Clent Demark PA

## 2017-10-22 NOTE — Patient Instructions (Addendum)
Diabetes Mellitus and Nutrition When you have diabetes (diabetes mellitus), it is very important to have healthy eating habits because your blood sugar (glucose) levels are greatly affected by what you eat and drink. Eating healthy foods in the appropriate amounts, at about the same times every day, can help you:  Control your blood glucose.  Lower your risk of heart disease.  Improve your blood pressure.  Reach or maintain a healthy weight.  Every person with diabetes is different, and each person has different needs for a meal plan. Your health care provider may recommend that you work with a diet and nutrition specialist (dietitian) to make a meal plan that is best for you. Your meal plan may vary depending on factors such as:  The calories you need.  The medicines you take.  Your weight.  Your blood glucose, blood pressure, and cholesterol levels.  Your activity level.  Other health conditions you have, such as heart or kidney disease.  How do carbohydrates affect me? Carbohydrates affect your blood glucose level more than any other type of food. Eating carbohydrates naturally increases the amount of glucose in your blood. Carbohydrate counting is a method for keeping track of how many carbohydrates you eat. Counting carbohydrates is important to keep your blood glucose at a healthy level, especially if you use insulin or take certain oral diabetes medicines. It is important to know how many carbohydrates you can safely have in each meal. This is different for every person. Your dietitian can help you calculate how many carbohydrates you should have at each meal and for snack. Foods that contain carbohydrates include:  Bread, cereal, rice, pasta, and crackers.  Potatoes and corn.  Peas, beans, and lentils.  Milk and yogurt.  Fruit and juice.  Desserts, such as cakes, cookies, ice cream, and candy.  How does alcohol affect me? Alcohol can cause a sudden decrease in blood  glucose (hypoglycemia), especially if you use insulin or take certain oral diabetes medicines. Hypoglycemia can be a life-threatening condition. Symptoms of hypoglycemia (sleepiness, dizziness, and confusion) are similar to symptoms of having too much alcohol. If your health care provider says that alcohol is safe for you, follow these guidelines:  Limit alcohol intake to no more than 1 drink per day for nonpregnant women and 2 drinks per day for men. One drink equals 12 oz of beer, 5 oz of wine, or 1 oz of hard liquor.  Do not drink on an empty stomach.  Keep yourself hydrated with water, diet soda, or unsweetened iced tea.  Keep in mind that regular soda, juice, and other mixers may contain a lot of sugar and must be counted as carbohydrates.  What are tips for following this plan? Reading food labels  Start by checking the serving size on the label. The amount of calories, carbohydrates, fats, and other nutrients listed on the label are based on one serving of the food. Many foods contain more than one serving per package.  Check the total grams (g) of carbohydrates in one serving. You can calculate the number of servings of carbohydrates in one serving by dividing the total carbohydrates by 15. For example, if a food has 30 g of total carbohydrates, it would be equal to 2 servings of carbohydrates.  Check the number of grams (g) of saturated and trans fats in one serving. Choose foods that have low or no amount of these fats.  Check the number of milligrams (mg) of sodium in one serving. Most people   should limit total sodium intake to less than 2,300 mg per day.  Always check the nutrition information of foods labeled as "low-fat" or "nonfat". These foods may be higher in added sugar or refined carbohydrates and should be avoided.  Talk to your dietitian to identify your daily goals for nutrients listed on the label. Shopping  Avoid buying canned, premade, or processed foods. These  foods tend to be high in fat, sodium, and added sugar.  Shop around the outside edge of the grocery store. This includes fresh fruits and vegetables, bulk grains, fresh meats, and fresh dairy. Cooking  Use low-heat cooking methods, such as baking, instead of high-heat cooking methods like deep frying.  Cook using healthy oils, such as olive, canola, or sunflower oil.  Avoid cooking with butter, cream, or high-fat meats. Meal planning  Eat meals and snacks regularly, preferably at the same times every day. Avoid going long periods of time without eating.  Eat foods high in fiber, such as fresh fruits, vegetables, beans, and whole grains. Talk to your dietitian about how many servings of carbohydrates you can eat at each meal.  Eat 4-6 ounces of lean protein each day, such as lean meat, chicken, fish, eggs, or tofu. 1 ounce is equal to 1 ounce of meat, chicken, or fish, 1 egg, or 1/4 cup of tofu.  Eat some foods each day that contain healthy fats, such as avocado, nuts, seeds, and fish. Lifestyle   Check your blood glucose regularly.  Exercise at least 30 minutes 5 or more days each week, or as told by your health care provider.  Take medicines as told by your health care provider.  Do not use any products that contain nicotine or tobacco, such as cigarettes and e-cigarettes. If you need help quitting, ask your health care provider.  Work with a Social worker or diabetes educator to identify strategies to manage stress and any emotional and social challenges. What are some questions to ask my health care provider?  Do I need to meet with a diabetes educator?  Do I need to meet with a dietitian?  What number can I call if I have questions?  When are the best times to check my blood glucose? Where to find more information:  American Diabetes Association: diabetes.org/food-and-fitness/food  Academy of Nutrition and Dietetics:  PokerClues.dk  [employer] of Diabetes and Digestive and Kidney Diseases (NIH): ContactWire.be Summary  A healthy meal plan will help you control your blood glucose and maintain a healthy lifestyle.  Working with a diet and nutrition specialist (dietitian) can help you make a meal plan that is best for you.  Keep in mind that carbohydrates and alcohol have immediate effects on your blood glucose levels. It is important to count carbohydrates and to use alcohol carefully. This information is not intended to replace advice given to you by your health care provider. Make sure you discuss any questions you have with your health care provider. Document Released: 11/14/2004 Document Revised: 03/24/2016 Document Reviewed: 03/24/2016 Elsevier Interactive Patient Education  2018 [place].    Cough, Adult A cough helps to clear your throat and lungs. A cough may last only 2-3 weeks (acute), or it may last longer than 8 weeks (chronic). Many different things can cause a cough. A cough may be a sign of an illness or another medical condition. Follow these instructions at home:  Pay attention to any changes in your cough.  Take medicines only as told by your doctor. ? If you  were prescribed an antibiotic medicine, take it as told by your doctor. Do not stop taking it even if you start to feel better. ? Talk with your doctor before you try using a cough medicine.  Drink enough fluid to keep your pee (urine) clear or pale yellow.  If the air is dry, use a cold steam vaporizer or humidifier in your home.  Stay away from things that make you cough at work or at home.  If your cough is worse at night, try using extra pillows to raise your head up higher while you sleep.  Do not smoke, and try not to be around smoke. If you need help quitting, ask your doctor.  Do  not have caffeine.  Do not drink alcohol.  Rest as needed. Contact a doctor if:  You have new problems (symptoms).  You cough up yellow fluid (pus).  Your cough does not get better after 2-3 weeks, or your cough gets worse.  Medicine does not help your cough and you are not sleeping well.  You have pain that gets worse or pain that is not helped with medicine.  You have a fever.  You are losing weight and you do not know why.  You have night sweats. Get help right away if:  You cough up blood.  You have trouble breathing.  Your heartbeat is very fast. This information is not intended to replace advice given to you by your health care provider. Make sure you discuss any questions you have with your health care provider. Document Released: 10/31/2010 Document Revised: 07/26/2015 Document Reviewed: 04/26/2014 Elsevier Interactive Patient Education  [employer].

## 2017-10-23 LAB — URINE CYTOLOGY ANCILLARY ONLY
Chlamydia: NEGATIVE
Neisseria Gonorrhea: NEGATIVE
Trichomonas: NEGATIVE

## 2017-10-26 ENCOUNTER — Telehealth (INDEPENDENT_AMBULATORY_CARE_PROVIDER_SITE_OTHER): Payer: Self-pay

## 2017-10-26 NOTE — Telephone Encounter (Signed)
-----   Message from Clent Demark, PA-C sent at 10/26/2017  8:42 AM EDT ----- Chlamydia, Gonorrhea, and Trichomonas negative.

## 2017-10-26 NOTE — Telephone Encounter (Signed)
Patient was not home. His mother did not want to receive the results. Will call back at a later time. Nat Christen, CMA

## 2017-10-27 ENCOUNTER — Telehealth (INDEPENDENT_AMBULATORY_CARE_PROVIDER_SITE_OTHER): Payer: Self-pay

## 2017-10-27 ENCOUNTER — Encounter (INDEPENDENT_AMBULATORY_CARE_PROVIDER_SITE_OTHER): Payer: Self-pay

## 2017-10-27 ENCOUNTER — Telehealth (INDEPENDENT_AMBULATORY_CARE_PROVIDER_SITE_OTHER): Payer: Self-pay | Admitting: Physician Assistant

## 2017-10-27 NOTE — Telephone Encounter (Signed)
Patient called stating that his pharmacy closed and needs for all his medications transferred to [facility] in [place].  Please f/u

## 2017-10-27 NOTE — Telephone Encounter (Signed)
Called patient again and he was not home. Results mailed. Nat Christen, CMA

## 2017-10-27 NOTE — Telephone Encounter (Signed)
Called patient number back. A man states he is not there and will have him get in touch with our office. Nat Christen, CMA

## 2017-10-27 NOTE — Telephone Encounter (Signed)
-----   Message from Clent Demark, PA-C sent at 10/26/2017  8:42 AM EDT ----- Chlamydia, Gonorrhea, and Trichomonas negative.

## 2017-10-28 ENCOUNTER — Telehealth (INDEPENDENT_AMBULATORY_CARE_PROVIDER_SITE_OTHER): Payer: Self-pay

## 2017-10-28 ENCOUNTER — Other Ambulatory Visit (INDEPENDENT_AMBULATORY_CARE_PROVIDER_SITE_OTHER): Payer: Self-pay | Admitting: Physician Assistant

## 2017-10-28 DIAGNOSIS — I10 Essential (primary) hypertension: Secondary | ICD-10-CM

## 2017-10-28 MED ORDER — NAPROXEN 500 MG PO TABS: 500.0000 mg | ORAL_TABLET | Freq: Two times a day (BID) | ORAL | 0 refills | Status: DC

## 2017-10-28 MED ORDER — AMLODIPINE BESYLATE 10 MG PO TABS: 10.0000 mg | ORAL_TABLET | Freq: Every day | ORAL | 1 refills | Status: DC

## 2017-10-28 MED ORDER — ALBUTEROL SULFATE HFA 108 (90 BASE) MCG/ACT IN AERS: 2.0000 | INHALATION_SPRAY | RESPIRATORY_TRACT | 0 refills | Status: DC | PRN

## 2017-10-28 MED ORDER — ASPIRIN 81 MG PO CHEW: 81.0000 mg | CHEWABLE_TABLET | Freq: Every day | ORAL | 3 refills | Status: DC

## 2017-10-28 MED ORDER — HYDROCHLOROTHIAZIDE 25 MG PO TABS: 25.0000 mg | ORAL_TABLET | Freq: Every day | ORAL | 1 refills | Status: DC

## 2017-10-28 MED ORDER — METFORMIN HCL ER 500 MG PO TB24: 500.0000 mg | ORAL_TABLET | Freq: Every day | ORAL | 1 refills | Status: DC

## 2017-10-28 MED ORDER — ATORVASTATIN CALCIUM 40 MG PO TABS: 40.0000 mg | ORAL_TABLET | Freq: Every day | ORAL | 0 refills | Status: DC

## 2017-10-28 MED ORDER — ATORVASTATIN CALCIUM 40 MG PO TABS: 40.0000 mg | ORAL_TABLET | Freq: Every day | ORAL | 1 refills | Status: DC

## 2017-10-28 MED ORDER — ASPIRIN 81 MG PO CHEW: 81.0000 mg | CHEWABLE_TABLET | Freq: Every day | ORAL | 0 refills | Status: DC

## 2017-10-28 NOTE — Telephone Encounter (Signed)
I sent meds to [facility] in [place]. Please notify patient.

## 2017-10-28 NOTE — Telephone Encounter (Signed)
Patient called stating that his current pharmacy has gone out of business and did not transfer his prescriptions. Patient needs all prescriptions sent to the [facility] in [place]. Nat Christen, CMA

## 2017-10-28 NOTE — Telephone Encounter (Signed)
Patient is aware. Dillon Wang Dillon Wang, CMA  

## 2017-10-29 ENCOUNTER — Other Ambulatory Visit (INDEPENDENT_AMBULATORY_CARE_PROVIDER_SITE_OTHER): Payer: Self-pay | Admitting: Physician Assistant

## 2017-10-29 MED ORDER — BENZONATATE 100 MG PO CAPS: 100.0000 mg | ORAL_CAPSULE | Freq: Two times a day (BID) | ORAL | 0 refills | Status: DC | PRN

## 2017-10-29 NOTE — Telephone Encounter (Signed)
FWD to PCP. Daron Breeding S Oliverio Cho, CMA  

## 2017-10-29 NOTE — Telephone Encounter (Signed)
Patient aware. Dillon Wang, CMA  

## 2017-10-29 NOTE — Telephone Encounter (Signed)
I will send short course.

## 2017-10-29 NOTE — Telephone Encounter (Signed)
Patient girlfriend called stating that they went to pick Dillon Wang medication and was told that an RX for benzonatate (TESSALON) 100 MG capsule  Was not sent, patient girlfriend want to know if PCP can send RX to [facility] in [place].  Please advice [phone]  Thank you Dillon Wang

## 2017-11-18 ENCOUNTER — Other Ambulatory Visit: Payer: Self-pay

## 2017-11-18 ENCOUNTER — Encounter (INDEPENDENT_AMBULATORY_CARE_PROVIDER_SITE_OTHER): Payer: Self-pay | Admitting: Physician Assistant

## 2017-11-18 ENCOUNTER — Ambulatory Visit (INDEPENDENT_AMBULATORY_CARE_PROVIDER_SITE_OTHER): Payer: Self-pay | Admitting: Physician Assistant

## 2017-11-18 VITALS — BP 147/86 | HR 87 | Temp 98.6°F | Ht 69.0 in | Wt 285.6 lb

## 2017-11-18 DIAGNOSIS — R400 Somnolence: Secondary | ICD-10-CM

## 2017-11-18 DIAGNOSIS — R51 Headache: Secondary | ICD-10-CM

## 2017-11-18 DIAGNOSIS — R519 Headache, unspecified: Secondary | ICD-10-CM

## 2017-11-18 DIAGNOSIS — I10 Essential (primary) hypertension: Secondary | ICD-10-CM

## 2017-11-18 DIAGNOSIS — R0683 Snoring: Secondary | ICD-10-CM

## 2017-11-18 DIAGNOSIS — R600 Localized edema: Secondary | ICD-10-CM

## 2017-11-18 DIAGNOSIS — R0601 Orthopnea: Secondary | ICD-10-CM

## 2017-11-18 DIAGNOSIS — R0600 Dyspnea, unspecified: Secondary | ICD-10-CM

## 2017-11-18 DIAGNOSIS — Z8669 Personal history of other diseases of the nervous system and sense organs: Secondary | ICD-10-CM

## 2017-11-18 MED ORDER — FUROSEMIDE 40 MG PO TABS: 40.0000 mg | ORAL_TABLET | Freq: Every day | ORAL | 2 refills | Status: DC | PRN

## 2017-11-18 NOTE — Progress Notes (Signed)
Pt states his cough was so bad on Sunday that while coughing he passed out

## 2017-11-18 NOTE — Progress Notes (Signed)
Subjective:  Patient ID: Dillon Wang, male    DOB: [birth year]  Age: 52 y.o. MRN: [id number]  CC: foot swelling  HPI Dillon Wang is a 52 y.o. male with a medical history of testicular cancer, HTN, PND, OSA, and cocaine abuse presents with complaint of bilateral LE edema. There is also orthopnea, PND, exertional dyspnea. Took two HCTZ pills and symptoms resolved somewhat. Denies CP, palpitations, abdominal pain, f/c/n/v, rash, or GI/GU sxs.     Patient states he has not used a CPAP for approximately 3-4 years. Complains of daytime somnolence, morning headaches, loud snoring, and has been seen to stop breathing during sleep.    Outpatient Medications Prior to Visit  Medication Sig Dispense Refill  . albuterol (PROVENTIL HFA;VENTOLIN HFA) 108 (90 Base) MCG/ACT inhaler Inhale 2 puffs into the lungs every 4 (four) hours as needed for wheezing or shortness of breath. 1 Inhaler 0  . amLODipine (NORVASC) 10 MG tablet Take 1 tablet (10 mg total) by mouth daily. 90 tablet 1  . aspirin 81 MG chewable tablet Chew 1 tablet (81 mg total) by mouth daily at 6 (six) AM. 90 tablet 3  . atorvastatin (LIPITOR) 40 MG tablet Take 1 tablet (40 mg total) by mouth daily. 90 tablet 1  . hydrochlorothiazide (HYDRODIURIL) 25 MG tablet Take 1 tablet (25 mg total) by mouth daily. Take on tablet in the morning. 90 tablet 1  . metFORMIN (GLUCOPHAGE-XR) 500 MG 24 hr tablet Take 1 tablet (500 mg total) by mouth daily with breakfast. 90 tablet 1  . naproxen (NAPROSYN) 500 MG tablet Take 1 tablet (500 mg total) by mouth 2 (two) times daily with a meal. 30 tablet 0  . benzonatate (TESSALON) 100 MG capsule Take 1 capsule (100 mg total) by mouth 2 (two) times daily as needed for cough. (Patient not taking: Reported on 11/18/2017) 20 capsule 0   No facility-administered medications prior to visit.      ROS Review of Systems  Constitutional: Negative for chills, fever and malaise/fatigue.  Eyes: Negative for blurred vision.   Respiratory: Negative for shortness of breath.   Cardiovascular: Positive for orthopnea, leg swelling and PND. Negative for chest pain and palpitations.  Gastrointestinal: Negative for abdominal pain and nausea.  Genitourinary: Negative for dysuria and hematuria.  Musculoskeletal: Negative for joint pain and myalgias.  Skin: Negative for rash.  Neurological: Positive for headaches. Negative for tingling.  Psychiatric/Behavioral: Negative for depression. The patient is not nervous/anxious.     Objective:  Ht 5\' 9"  (1.753 m)   Wt 285 lb 9.6 oz (129.5 kg)   BMI 42.18 kg/m   Vitals:   11/18/17 1604  Weight: 285 lb 9.6 oz (129.5 kg)  Height: 5\' 9"  (1.753 m)    Physical Exam  Constitutional: He is oriented to person, place, and time.  Well developed, obese, NAD, polite  HENT:  Head: Normocephalic and atraumatic.  Eyes: No scleral icterus.  Neck: Normal range of motion. Neck supple. No thyromegaly present.  Cardiovascular: Normal rate, regular rhythm and normal heart sounds.  1-2 + pitting edema of the bilateral lower extremity  Pulmonary/Chest: Effort normal and breath sounds normal.  Abdominal: Soft. Bowel sounds are normal. He exhibits no distension. There is no tenderness.  Musculoskeletal: He exhibits no edema.  Neurological: He is alert and oriented to person, place, and time.  Skin: Skin is warm and dry. No rash noted. No erythema. No pallor.  Psychiatric: He has a normal mood and affect. His behavior is normal.  Thought content normal.  Vitals reviewed.    Assessment & Plan:   1. Hypertension, unspecified type - Brain natriuretic peptide - Basic Metabolic Panel - Continue on HCTZ and Amlodipine as directed.  - Begin furosemide (LASIX) 40 MG tablet; Take 1 tablet (40 mg total) by mouth daily as needed.  Dispense: 60 tablet; Refill: 2 - I have ordered a split night study to have his OSA controlled which will in   turn help control HTN.   2. Bilateral lower extremity  edema - Begin furosemide (LASIX) 40 MG tablet; Take 1 tablet (40 mg total) by mouth daily as needed.  Dispense: 60 tablet; Refill: 2  3. Orthopnea - Brain natriuretic peptide  4. PND (paroxysmal nocturnal dyspnea) - Brain natriuretic peptide  5. History of sleep apnea - Split night study; Future  6. Daytime somnolence - Split night study; Future  7. Morning headache - Split night study; Future  8. Snoring - Split night study; Future   Meds ordered this encounter  Medications  . furosemide (LASIX) 40 MG tablet    Sig: Take 1 tablet (40 mg total) by mouth daily as needed.    Dispense:  60 tablet    Refill:  2    Order Specific Question:   Supervising Provider    Answer:   Charlott Rakes [[id number]]    Follow-up: Return in about 4 weeks (around 12/16/2017) for HTN and edema.   Clent Demark PA

## 2017-11-18 NOTE — Patient Instructions (Signed)
Edema Edema is when you have too much fluid in your body or under your skin. Edema may make your legs, feet, and ankles swell up. Swelling is also common in looser tissues, like around your eyes. This is a common condition. It gets more common as you get older. There are many possible causes of edema. Eating too much salt (sodium) and being on your feet or sitting for a long time can cause edema in your legs, feet, and ankles. Hot weather may make edema worse. Edema is usually painless. Your skin may look swollen or shiny. Follow these instructions at home:  Keep the swollen body part raised (elevated) above the level of your heart when you are sitting or lying down.  Do not sit still or stand for a long time.  Do not wear tight clothes. Do not wear garters on your upper legs.  Exercise your legs. This can help the swelling go down.  Wear elastic bandages or support stockings as told by your doctor.  Eat a low-salt (low-sodium) diet to reduce fluid as told by your doctor.  Depending on the cause of your swelling, you may need to limit how much fluid you drink (fluid restriction).  Take over-the-counter and prescription medicines only as told by your doctor. Contact a doctor if:  Treatment is not working.  You have heart, liver, or kidney disease and have symptoms of edema.  You have sudden and unexplained weight gain. Get help right away if:  You have shortness of breath or chest pain.  You cannot breathe when you lie down.  You have pain, redness, or warmth in the swollen areas.  You have heart, liver, or kidney disease and get edema all of a sudden.  You have a fever and your symptoms get worse all of a sudden. Summary  Edema is when you have too much fluid in your body or under your skin.  Edema may make your legs, feet, and ankles swell up. Swelling is also common in looser tissues, like around your eyes.  Raise (elevate) the swollen body part above the level of your  heart when you are sitting or lying down.  Follow your doctor's instructions about diet and how much fluid you can drink (fluid restriction). This information is not intended to replace advice given to you by your health care provider. Make sure you discuss any questions you have with your health care provider. Document Released: 08/06/2007 Document Revised: 03/07/2016 Document Reviewed: 03/07/2016 Elsevier Interactive Patient Education  2017 Elsevier Inc.  

## 2017-11-25 ENCOUNTER — Telehealth (INDEPENDENT_AMBULATORY_CARE_PROVIDER_SITE_OTHER): Payer: Self-pay

## 2017-11-25 NOTE — Telephone Encounter (Signed)
Pt will need to have blood work done. Stop Lasix and have BMP done here. Atorvastatin may also cause muscle pain but I think he may have been taking for a long time.

## 2017-11-25 NOTE — Telephone Encounter (Signed)
Patient called stating that he is having cramping in his legs. He wants to know if one of his medications is causing the cramping, and if so can he discontinue that medication. Please advise. Dillon Wang, CMA

## 2017-11-26 NOTE — Telephone Encounter (Signed)
Left message asking patient to call [facility]. Nat Christen, CMA

## 2017-11-26 NOTE — Telephone Encounter (Signed)
Patient is aware to stop taking Lasix and return to clinic to have BMP drawn. Also aware that atorvastatin may cause muscle pain but PCP thinks he may have been taking it for a long time. Patient states he will return on 11/27/17 for lab draw. Nat Christen, CMA

## 2017-12-03 ENCOUNTER — Ambulatory Visit (INDEPENDENT_AMBULATORY_CARE_PROVIDER_SITE_OTHER): Payer: Self-pay | Admitting: Physician Assistant

## 2017-12-11 ENCOUNTER — Other Ambulatory Visit (INDEPENDENT_AMBULATORY_CARE_PROVIDER_SITE_OTHER): Payer: Self-pay | Admitting: Physician Assistant

## 2017-12-11 NOTE — Telephone Encounter (Signed)
FWD to PCP. Dillon Wang S Dillon Wang, CMA  

## 2017-12-18 ENCOUNTER — Ambulatory Visit (INDEPENDENT_AMBULATORY_CARE_PROVIDER_SITE_OTHER): Payer: Self-pay | Admitting: Physician Assistant

## 2018-01-12 ENCOUNTER — Encounter (INDEPENDENT_AMBULATORY_CARE_PROVIDER_SITE_OTHER): Payer: Self-pay | Admitting: Physician Assistant

## 2018-01-12 ENCOUNTER — Other Ambulatory Visit: Payer: Self-pay

## 2018-01-12 ENCOUNTER — Ambulatory Visit (INDEPENDENT_AMBULATORY_CARE_PROVIDER_SITE_OTHER): Payer: Self-pay | Admitting: Physician Assistant

## 2018-01-12 VITALS — BP 144/101 | HR 99 | Temp 98.0°F | Ht 69.0 in | Wt 276.8 lb

## 2018-01-12 DIAGNOSIS — R569 Unspecified convulsions: Secondary | ICD-10-CM

## 2018-01-12 DIAGNOSIS — R053 Chronic cough: Secondary | ICD-10-CM

## 2018-01-12 DIAGNOSIS — R05 Cough: Secondary | ICD-10-CM

## 2018-01-12 DIAGNOSIS — R600 Localized edema: Secondary | ICD-10-CM

## 2018-01-12 DIAGNOSIS — Z760 Encounter for issue of repeat prescription: Secondary | ICD-10-CM

## 2018-01-12 DIAGNOSIS — R55 Syncope and collapse: Secondary | ICD-10-CM

## 2018-01-12 DIAGNOSIS — G4733 Obstructive sleep apnea (adult) (pediatric): Secondary | ICD-10-CM

## 2018-01-12 DIAGNOSIS — I10 Essential (primary) hypertension: Secondary | ICD-10-CM

## 2018-01-12 MED ORDER — AMLODIPINE BESYLATE 10 MG PO TABS: 10.0000 mg | ORAL_TABLET | Freq: Every day | ORAL | 1 refills | Status: DC

## 2018-01-12 MED ORDER — FUROSEMIDE 40 MG PO TABS: 40.0000 mg | ORAL_TABLET | Freq: Every day | ORAL | 1 refills | Status: DC | PRN

## 2018-01-12 MED ORDER — GABAPENTIN 300 MG PO CAPS: 300.0000 mg | ORAL_CAPSULE | Freq: Three times a day (TID) | ORAL | 3 refills | Status: DC

## 2018-01-12 MED ORDER — HYDROCHLOROTHIAZIDE 25 MG PO TABS: 25.0000 mg | ORAL_TABLET | Freq: Every day | ORAL | 1 refills | Status: DC

## 2018-01-12 MED ORDER — ASPIRIN 81 MG PO CHEW: 81.0000 mg | CHEWABLE_TABLET | Freq: Every day | ORAL | 3 refills | Status: DC

## 2018-01-12 MED ORDER — ATORVASTATIN CALCIUM 40 MG PO TABS: 40.0000 mg | ORAL_TABLET | Freq: Every day | ORAL | 1 refills | Status: DC

## 2018-01-12 MED ORDER — METFORMIN HCL ER 500 MG PO TB24: 500.0000 mg | ORAL_TABLET | Freq: Every day | ORAL | 1 refills | Status: DC

## 2018-01-12 MED ORDER — LEVOFLOXACIN 500 MG PO TABS: 500.0000 mg | ORAL_TABLET | Freq: Every day | ORAL | 0 refills | Status: DC

## 2018-01-12 MED ORDER — HYDROCOD POLST-CPM POLST ER 10-8 MG/5ML PO SUER: 5.0000 mL | Freq: Two times a day (BID) | ORAL | 0 refills | Status: DC

## 2018-01-12 NOTE — Progress Notes (Signed)
Subjective:  Patient ID: Dillon Wang, male    DOB: [birth year]  Age: 52 y.o. MRN: [id number]  CC: "falling out"   HPI Dillon Garneris a 52 y.o.malewith a medical history of testicular cancer, HTN, PND, OSA, and cocaine abuse presents with cough since three weeks ago. Dillon Wang accompanies patient and states he coughed so much that he passed out. Paramedics were called and pt was told his "oxygen was cutting off from so much coughing". Pt has refused to go to the hospital. Pt says he did not want to go because he previously went to ED at [facility] on 10/18/17 for syncope and "nothing was found". CT head without contrast was negative. Pt was diagnosed with syncope, cough, and cocaine abuse. Pt reported cocaine use of every other day for the past 10 years. Dillon Wang states patient seems to convulse for a few moments with LOC and then becomes fully conscious as if nothing had happened at all. Pt unable to answer questions for himself due to severe fatigue. He has not slept well due to cough and he has not used a CPAP due to lack of funds. Pt's fiancee states they have been declined for the [facility] discount but have been accepted and are fully covered by [employer]. Does not endorse any other symptoms or complaints.              Outpatient Medications Prior to Visit  Medication Sig Dispense Refill  . amLODipine (NORVASC) 10 MG tablet Take 1 tablet (10 mg total) by mouth daily. 90 tablet 1  . aspirin 81 MG chewable tablet Chew 1 tablet (81 mg total) by mouth daily at 6 (six) AM. 90 tablet 3  . atorvastatin (LIPITOR) 40 MG tablet Take 1 tablet (40 mg total) by mouth daily. 90 tablet 1  . hydrochlorothiazide (HYDRODIURIL) 25 MG tablet Take 1 tablet (25 mg total) by mouth daily. Take on tablet in the morning. 90 tablet 1  . metFORMIN (GLUCOPHAGE-XR) 500 MG 24 hr tablet Take 1 tablet (500 mg total) by mouth daily with breakfast. 90 tablet 1  . albuterol (PROVENTIL HFA;VENTOLIN HFA) 108 (90 Base)  MCG/ACT inhaler Inhale 2 puffs into the lungs every 4 (four) hours as needed for wheezing or shortness of breath. 1 Inhaler 0  . furosemide (LASIX) 40 MG tablet Take 1 tablet (40 mg total) by mouth daily as needed. (Patient not taking: Reported on 01/12/2018) 60 tablet 2  . naproxen (NAPROSYN) 500 MG tablet Take 1 tablet (500 mg total) by mouth 2 (two) times daily with a meal. (Patient not taking: Reported on 01/12/2018) 30 tablet 0  . benzonatate (TESSALON) 100 MG capsule Take 1 capsule (100 mg total) by mouth 2 (two) times daily as needed for cough. (Patient not taking: Reported on 11/18/2017) 20 capsule 0   No facility-administered medications prior to visit.      ROS Review of Systems  Constitutional: Positive for malaise/fatigue. Negative for chills and fever.  Eyes: Negative for blurred vision.  Respiratory: Positive for cough. Negative for shortness of breath.   Cardiovascular: Negative for chest pain and palpitations.  Gastrointestinal: Negative for abdominal pain and nausea.  Genitourinary: Negative for dysuria and hematuria.  Musculoskeletal: Negative for joint pain and myalgias.  Skin: Negative for rash.  Neurological: Negative for tingling and headaches.  Psychiatric/Behavioral: Positive for substance abuse. Negative for depression. The patient has insomnia. The patient is not nervous/anxious.     Objective:  BP (!) 144/101 (BP Location: Right Arm, Patient  Position: Sitting, Cuff Size: Large)   Pulse 99   Temp 98 F (36.7 C) (Oral)   Ht 5\' 9"  (1.753 m)   Wt 276 lb 12.8 oz (125.6 kg)   SpO2 94%   BMI 40.88 kg/m   BP/Weight 01/12/2018 11/18/2017 3/55/7322  Systolic BP 025 427 062  Diastolic BP 376 86 87  Wt. (Lbs) 276.8 285.6 278.6  BMI 40.88 42.18 41.14      Physical Exam  Constitutional: He is oriented to person, place, and time.  Well developed, obese, NAD, somnolent, snoring, harsh coughing spells on occasion  HENT:  Head: Normocephalic and atraumatic.   Eyes: No scleral icterus.  Neck: Normal range of motion. Neck supple. No thyromegaly present.  Cardiovascular: Normal rate, regular rhythm and normal heart sounds.  Pulmonary/Chest: Effort normal. No stridor. No respiratory distress. He has no wheezes. He has rales (coarse bilaterally at bases).  Musculoskeletal: He exhibits no edema.  Neurological: He is alert and oriented to person, place, and time.  Skin: Skin is warm and dry. No rash noted. No erythema. No pallor.  Psychiatric: He has a normal mood and affect. His behavior is normal. Thought content normal.  Vitals reviewed.    Assessment & Plan:   1. Chronic cough - Ambulatory referral to Pulmonology to [facility] due to full coverage with [facility] system. - DG Chest 2 View; Future - Begin chlorpheniramine-HYDROcodone (TUSSIONEX PENNKINETIC ER) 10-8 MG/5ML SUER; Take 5 mLs by mouth 2 (two) times daily.  Dispense: 115 mL; Refill: 0 - Begin levofloxacin (LEVAQUIN) 500 MG tablet; Take 1 tablet (500 mg total) by mouth daily.  Dispense: 7 tablet; Refill: 0  2. Syncope, unspecified syncope type - EKG 12-Lead with nonspecific T wave abnormality consistent with previous EKGs.  - CBC with Differential - Comprehensive metabolic panel - TSH - Drug Screen, Urine - Magnesium  3. Seizures ([place]) - Ambulatory referral to Neurology to [facility] neurology due to full coverage with [facility] health system.  - Drug Screen, Urine - Magnesium - Begin gabapentin (NEURONTIN) 300 MG capsule; Take 1 capsule (300 mg total) by mouth 3 (three) times daily.  Dispense: 90 capsule; Refill: 3  4. Hypertension, unspecified type - CBC with Differential - Comprehensive metabolic panel - TSH - Lipid panel - Refill amLODipine (NORVASC) 10 MG tablet; Take 1 tablet (10 mg total) by mouth daily.  Dispense: 90 tablet; Refill: 1 - Refill atorvastatin (LIPITOR) 40 MG tablet; Take 1 tablet (40 mg total) by mouth daily.  Dispense: 90 tablet; Refill: 1 - Refill aspirin  81 MG chewable tablet; Chew 1 tablet (81 mg total) by mouth daily at 6 (six) AM.  Dispense: 90 tablet; Refill: 3 - Refill hydrochlorothiazide (HYDRODIURIL) 25 MG tablet; Take 1 tablet (25 mg total) by mouth daily. Take on tablet in the morning.  Dispense: 90 tablet; Refill: 1  5. OSA (obstructive sleep apnea) - Pt and fiancee advised to establish PCP with [facility] since they are fully covered for expenses through the [place] care program. It may be possible his CPAP expenses will also be covered.  6. Bilateral lower extremity edema - Refill furosemide (LASIX) 40 MG tablet; Take 1 tablet (40 mg total) by mouth daily as needed.  Dispense: 30 tablet; Refill: 1  7. Medication refill - atorvastatin (LIPITOR) 40 MG tablet; Take 1 tablet (40 mg total) by mouth daily.  Dispense: 90 tablet; Refill: 1 - metFORMIN (GLUCOPHAGE-XR) 500 MG 24 hr tablet; Take 1 tablet (500 mg total) by mouth daily  with breakfast.  Dispense: 90 tablet; Refill: 1   Meds ordered this encounter  Medications  . gabapentin (NEURONTIN) 300 MG capsule    Sig: Take 1 capsule (300 mg total) by mouth 3 (three) times daily.    Dispense:  90 capsule    Refill:  3    Order Specific Question:   Supervising Provider    Answer:   Charlott Rakes [[id number]]  . furosemide (LASIX) 40 MG tablet    Sig: Take 1 tablet (40 mg total) by mouth daily as needed.    Dispense:  30 tablet    Refill:  1    Order Specific Question:   Supervising Provider    Answer:   Charlott Rakes [[id number]]  . amLODipine (NORVASC) 10 MG tablet    Sig: Take 1 tablet (10 mg total) by mouth daily.    Dispense:  90 tablet    Refill:  1    Order Specific Question:   Supervising Provider    Answer:   Charlott Rakes [[id number]]  . atorvastatin (LIPITOR) 40 MG tablet    Sig: Take 1 tablet (40 mg total) by mouth daily.    Dispense:  90 tablet    Refill:  1    Order Specific Question:   Supervising Provider    Answer:   Charlott Rakes [[id number]]  . aspirin 81 MG chewable tablet     Sig: Chew 1 tablet (81 mg total) by mouth daily at 6 (six) AM.    Dispense:  90 tablet    Refill:  3    Order Specific Question:   Supervising Provider    Answer:   Charlott Rakes [[id number]]  . metFORMIN (GLUCOPHAGE-XR) 500 MG 24 hr tablet    Sig: Take 1 tablet (500 mg total) by mouth daily with breakfast.    Dispense:  90 tablet    Refill:  1    Order Specific Question:   Supervising Provider    Answer:   Charlott Rakes [[id number]]  . hydrochlorothiazide (HYDRODIURIL) 25 MG tablet    Sig: Take 1 tablet (25 mg total) by mouth daily. Take on tablet in the morning.    Dispense:  90 tablet    Refill:  1    Order Specific Question:   Supervising Provider    Answer:   Charlott Rakes [[id number]]  . chlorpheniramine-HYDROcodone (TUSSIONEX PENNKINETIC ER) 10-8 MG/5ML SUER    Sig: Take 5 mLs by mouth 2 (two) times daily.    Dispense:  115 mL    Refill:  0    Order Specific Question:   Supervising Provider    Answer:   Charlott Rakes [[id number]]  . levofloxacin (LEVAQUIN) 500 MG tablet    Sig: Take 1 tablet (500 mg total) by mouth daily.    Dispense:  7 tablet    Refill:  0    Order Specific Question:   Supervising Provider    Answer:   Charlott Rakes [[id number]]    Follow-up: Return if symptoms worsen or fail to improve, for Pt to establish PCP in [facility] since he has financial assistance at [facility].   Clent Demark PA

## 2018-01-12 NOTE — Patient Instructions (Signed)
Seizure, Adult °When you have a seizure: °· Parts of your body may move. °· How aware or awake (conscious) you are may change. °· You may shake (convulse). ° °Some people have symptoms right before a seizure happens. These symptoms may include: °· Fear. °· Worry (anxiety). °· Feeling like you are going to throw up (nausea). °· Feeling like the room is spinning (vertigo). °· Feeling like you saw or heard something before (deja vu). °· Odd tastes or smells. °· Changes in vision, such as seeing flashing lights or spots. ° °Seizures usually last from 30 seconds to 2 minutes. Usually, they are not harmful unless they last a long time. °Follow these instructions at home: °Medicines °· Take over-the-counter and prescription medicines only as told by your doctor. °· Avoid anything that may keep your medicine from working, such as alcohol. °Activity °· Do not do any activities that would be dangerous if you had another seizure, like driving or swimming. Wait until your doctor approves. °· If you live in the [place] ask your local DMV (department of motor vehicles) when you can drive. °· Rest. °Teaching others °· Teach friends and family what to do when you have a seizure. They should: °? Lay you on the ground. °? Protect your head and body. °? Loosen any tight clothing around your neck. °? Turn you on your side. °? Stay with you until you are better. °? Not hold you down. °? Not put anything in your mouth. °? Know whether or not you need emergency care. °General instructions °· Contact your doctor each time you have a seizure. °· Avoid anything that gives you seizures. °· Keep a seizure diary. Write down: °? What you think caused each seizure. °? What you remember about each seizure. °· Keep all follow-up visits as told by your doctor. This is important. °Contact a doctor if: °· You have another seizure. °· You have seizures more often. °· There is any change in what happens during your seizures. °· You continue to have  seizures with treatment. °· You have symptoms of being sick or having an infection. °Get help right away if: °· You have a seizure: °? That lasts longer than 5 minutes. °? That is different than seizures you had before. °? That makes it harder to breathe. °? After you hurt your head. °· After a seizure, you cannot speak or use a part of your body. °· After a seizure, you are confused or have a bad headache. °· You have two or more seizures in a row. °· You are having seizures more often. °· You do not wake up right after a seizure. °· You get hurt during a seizure. °In an emergency: °· These symptoms may be an emergency. Do not wait to see if the symptoms will go away. Get medical help right away. Call your local emergency services (911 in the [place] Do not drive yourself to the hospital. °This information is not intended to replace advice given to you by your health care provider. Make sure you discuss any questions you have with your health care provider. °Document Released: 08/06/2007 Document Revised: 10/31/2015 Document Reviewed: 10/31/2015 °Elsevier Interactive Patient Education © 2017 Elsevier Inc. ° °

## 2018-01-13 ENCOUNTER — Telehealth (INDEPENDENT_AMBULATORY_CARE_PROVIDER_SITE_OTHER): Payer: Self-pay

## 2018-01-13 LAB — CBC WITH DIFFERENTIAL/PLATELET
Basophils Absolute: 0.1 10*3/uL (ref 0.0–0.2)
Basos: 1 %
EOS (ABSOLUTE): 0.2 10*3/uL (ref 0.0–0.4)
EOS: 2 %
HEMOGLOBIN: 16.9 g/dL (ref 13.0–17.7)
Hematocrit: 48.3 % (ref 37.5–51.0)
Immature Grans (Abs): 0 10*3/uL (ref 0.0–0.1)
Immature Granulocytes: 0 %
LYMPHS ABS: 2.6 10*3/uL (ref 0.7–3.1)
Lymphs: 28 %
MCH: 31.2 pg (ref 26.6–33.0)
MCHC: 35 g/dL (ref 31.5–35.7)
MCV: 89 fL (ref 79–97)
MONOCYTES: 8 %
Monocytes Absolute: 0.7 10*3/uL (ref 0.1–0.9)
NEUTROS ABS: 5.8 10*3/uL (ref 1.4–7.0)
Neutrophils: 61 %
Platelets: 226 10*3/uL (ref 150–450)
RBC: 5.42 x10E6/uL (ref 4.14–5.80)
RDW: 13.2 % (ref 12.3–15.4)
WBC: 9.4 10*3/uL (ref 3.4–10.8)

## 2018-01-13 LAB — TSH: TSH: 1.42 u[IU]/mL (ref 0.450–4.500)

## 2018-01-13 LAB — LIPID PANEL
CHOLESTEROL TOTAL: 155 mg/dL (ref 100–199)
Chol/HDL Ratio: 4.7 ratio (ref 0.0–5.0)
HDL: 33 mg/dL — ABNORMAL LOW (ref >39)
LDL CALC: 89 mg/dL (ref 0–99)
TRIGLYCERIDES: 164 mg/dL — AB (ref 0–149)
VLDL Cholesterol Cal: 33 mg/dL (ref 5–40)

## 2018-01-13 LAB — DRUG SCREEN, URINE
AMPHETAMINES, URINE: NEGATIVE ng/mL
Barbiturate screen, urine: NEGATIVE ng/mL
Benzodiazepine Quant, Ur: NEGATIVE ng/mL
CANNABINOID QUANT UR: NEGATIVE ng/mL
Cocaine (Metab.): POSITIVE ng/mL — AB
Opiate Quant, Ur: NEGATIVE ng/mL
PCP QUANT UR: NEGATIVE ng/mL

## 2018-01-13 LAB — COMPREHENSIVE METABOLIC PANEL
ALBUMIN: 4.9 g/dL (ref 3.5–5.5)
ALK PHOS: 99 IU/L (ref 39–117)
ALT: 36 IU/L (ref 0–44)
AST: 25 IU/L (ref 0–40)
Albumin/Globulin Ratio: 1.9 (ref 1.2–2.2)
BILIRUBIN TOTAL: 0.6 mg/dL (ref 0.0–1.2)
BUN/Creatinine Ratio: 8 — ABNORMAL LOW (ref 9–20)
BUN: 11 mg/dL (ref 6–24)
CHLORIDE: 99 mmol/L (ref 96–106)
CO2: 23 mmol/L (ref 20–29)
CREATININE: 1.31 mg/dL — AB (ref 0.76–1.27)
Calcium: 9.7 mg/dL (ref 8.7–10.2)
GFR calc Af Amer: 72 mL/min/{1.73_m2} (ref >59)
GFR calc non Af Amer: 63 mL/min/{1.73_m2} (ref >59)
Globulin, Total: 2.6 g/dL (ref 1.5–4.5)
Glucose: 127 mg/dL — ABNORMAL HIGH (ref 65–99)
POTASSIUM: 4.1 mmol/L (ref 3.5–5.2)
SODIUM: 139 mmol/L (ref 134–144)
Total Protein: 7.5 g/dL (ref 6.0–8.5)

## 2018-01-13 LAB — MAGNESIUM: MAGNESIUM: 2.1 mg/dL (ref 1.6–2.3)

## 2018-01-13 NOTE — Telephone Encounter (Signed)
-----   Message from Clent Demark, PA-C sent at 01/13/2018 12:20 PM EST ----- Positive cocaine. CKD stable. Mildly increased triglycerides, can take OTC omega 3 fish oil pills. Rest of labs normal.

## 2018-01-13 NOTE — Telephone Encounter (Signed)
Patient is aware that there is no sign of infection. CKD stable, thyroid and magnesium normal. Mildly increased triglycerides take OTC omega 3 fish oil pills. Urine drug screen positive for cocaine. All other labs normal.Still awaiting chest Xray results. Patient expressed understanding. Nat Christen, CMA

## 2018-01-27 ENCOUNTER — Telehealth (INDEPENDENT_AMBULATORY_CARE_PROVIDER_SITE_OTHER): Payer: Self-pay | Admitting: Physician Assistant

## 2018-01-27 ENCOUNTER — Other Ambulatory Visit (INDEPENDENT_AMBULATORY_CARE_PROVIDER_SITE_OTHER): Payer: Self-pay | Admitting: Physician Assistant

## 2018-01-27 DIAGNOSIS — R569 Unspecified convulsions: Secondary | ICD-10-CM

## 2018-01-27 DIAGNOSIS — R05 Cough: Secondary | ICD-10-CM

## 2018-01-27 DIAGNOSIS — R053 Chronic cough: Secondary | ICD-10-CM

## 2018-01-27 MED ORDER — BENZONATATE 200 MG PO CAPS: 200.0000 mg | ORAL_CAPSULE | Freq: Two times a day (BID) | ORAL | 0 refills | Status: DC | PRN

## 2018-01-27 MED ORDER — LEVETIRACETAM 500 MG PO TABS: 500.0000 mg | ORAL_TABLET | Freq: Two times a day (BID) | ORAL | 1 refills | Status: DC

## 2018-01-27 NOTE — Telephone Encounter (Signed)
Patient wife called wanting to know if PCP can prescribe anything else for Seizures or if he could lower the dosage. States that patient is taking gabapentin (NEURONTIN) 300 MG capsule  But states that when he take the medication he feels weak, about to pass out and it seems like his heart beats faster. Patient wife also stated that he is having seizures almost everyday. Patient has an appointment with [facility] and with Pulmonology until January. They wanted to know if there is anything else they should do to help with his seizures.  Patient wife states that  chlorpheniramine-HYDROcodone (TUSSIONEX PENNKINETIC ER) 10-8 MG/5ML SUER  Helps with his cough and has finished it last night and wanted to request a refill for it.  Patient uses [place] [place] - [address]  Please Advice [phone]  Thank you Emmit Pomfret

## 2018-01-27 NOTE — Telephone Encounter (Signed)
Patients wife is aware that Keppra was sent for seizures. Stop taking gabapentin. Tussionex is a controlled substance, tessalon sent instead. Advised to stop using cocaine as UDS was positive. Establish care with the [facility] system as they are fully covered through the scholarship fund there. Patient needs to see neurologist. Wife is going to call and see if neuro appointment can be moved to an earlier date as patient is having more frequent seizures. Nat Christen, CMA

## 2018-01-27 NOTE — Telephone Encounter (Signed)
FWD to PCP. Aideliz Garmany S Maris Abascal, CMA  

## 2018-01-27 NOTE — Telephone Encounter (Signed)
I have sent Keppra for seizures. He may stop Gabapentin. Tussionex Pennkinetic not sent as this is a controlled substance. Instead, I have sent Tessalon Perles. I advise he stop using cocaine (positve UDS) and to establish care with the [facility] system since they are fully covered through [place] care program (this was discussed at the [place]). Pt needs to see a neurologist.

## 2018-02-20 ENCOUNTER — Emergency Department: Payer: Self-pay

## 2018-02-20 ENCOUNTER — Encounter: Payer: Self-pay | Admitting: Internal Medicine

## 2018-02-20 ENCOUNTER — Other Ambulatory Visit: Payer: Self-pay

## 2018-02-20 ENCOUNTER — Inpatient Hospital Stay
Admission: EM | Admit: 2018-02-20 | Discharge: 2018-02-22 | DRG: 896 | Disposition: A | Discharge status: Home / Self Care | Payer: Self-pay | Attending: Family Medicine | Admitting: Family Medicine

## 2018-02-20 DIAGNOSIS — E871 Hypo-osmolality and hyponatremia: Secondary | ICD-10-CM | POA: Diagnosis present

## 2018-02-20 DIAGNOSIS — Z8546 Personal history of malignant neoplasm of prostate: Secondary | ICD-10-CM

## 2018-02-20 DIAGNOSIS — N182 Chronic kidney disease, stage 2 (mild): Secondary | ICD-10-CM | POA: Diagnosis present

## 2018-02-20 DIAGNOSIS — G4700 Insomnia, unspecified: Secondary | ICD-10-CM | POA: Diagnosis present

## 2018-02-20 DIAGNOSIS — G92 Toxic encephalopathy: Secondary | ICD-10-CM | POA: Diagnosis present

## 2018-02-20 DIAGNOSIS — E876 Hypokalemia: Secondary | ICD-10-CM | POA: Diagnosis present

## 2018-02-20 DIAGNOSIS — I129 Hypertensive chronic kidney disease with stage 1 through stage 4 chronic kidney disease, or unspecified chronic kidney disease: Secondary | ICD-10-CM | POA: Diagnosis present

## 2018-02-20 DIAGNOSIS — R7989 Other specified abnormal findings of blood chemistry: Secondary | ICD-10-CM

## 2018-02-20 DIAGNOSIS — J9601 Acute respiratory failure with hypoxia: Secondary | ICD-10-CM | POA: Diagnosis present

## 2018-02-20 DIAGNOSIS — R0600 Dyspnea, unspecified: Secondary | ICD-10-CM

## 2018-02-20 DIAGNOSIS — Z880 Allergy status to penicillin: Secondary | ICD-10-CM

## 2018-02-20 DIAGNOSIS — E1122 Type 2 diabetes mellitus with diabetic chronic kidney disease: Secondary | ICD-10-CM | POA: Diagnosis present

## 2018-02-20 DIAGNOSIS — Z7982 Long term (current) use of aspirin: Secondary | ICD-10-CM

## 2018-02-20 DIAGNOSIS — J449 Chronic obstructive pulmonary disease, unspecified: Secondary | ICD-10-CM | POA: Diagnosis present

## 2018-02-20 DIAGNOSIS — F10288 Alcohol dependence with other alcohol-induced disorder: Principal | ICD-10-CM | POA: Diagnosis present

## 2018-02-20 DIAGNOSIS — R55 Syncope and collapse: Secondary | ICD-10-CM

## 2018-02-20 DIAGNOSIS — E878 Other disorders of electrolyte and fluid balance, not elsewhere classified: Secondary | ICD-10-CM | POA: Diagnosis present

## 2018-02-20 DIAGNOSIS — F17200 Nicotine dependence, unspecified, uncomplicated: Secondary | ICD-10-CM | POA: Diagnosis present

## 2018-02-20 DIAGNOSIS — Z7984 Long term (current) use of oral hypoglycemic drugs: Secondary | ICD-10-CM

## 2018-02-20 DIAGNOSIS — Z9119 Patient's noncompliance with other medical treatment and regimen: Secondary | ICD-10-CM

## 2018-02-20 DIAGNOSIS — E782 Mixed hyperlipidemia: Secondary | ICD-10-CM | POA: Diagnosis present

## 2018-02-20 DIAGNOSIS — E869 Volume depletion, unspecified: Secondary | ICD-10-CM

## 2018-02-20 DIAGNOSIS — E861 Hypovolemia: Secondary | ICD-10-CM | POA: Diagnosis present

## 2018-02-20 DIAGNOSIS — Z79899 Other long term (current) drug therapy: Secondary | ICD-10-CM

## 2018-02-20 DIAGNOSIS — F14188 Cocaine abuse with other cocaine-induced disorder: Secondary | ICD-10-CM | POA: Diagnosis present

## 2018-02-20 DIAGNOSIS — E860 Dehydration: Secondary | ICD-10-CM | POA: Diagnosis present

## 2018-02-20 DIAGNOSIS — Z6841 Body Mass Index (BMI) 40.0 and over, adult: Secondary | ICD-10-CM

## 2018-02-20 DIAGNOSIS — G4733 Obstructive sleep apnea (adult) (pediatric): Secondary | ICD-10-CM | POA: Diagnosis present

## 2018-02-20 DIAGNOSIS — E1165 Type 2 diabetes mellitus with hyperglycemia: Secondary | ICD-10-CM | POA: Diagnosis present

## 2018-02-20 DIAGNOSIS — F1410 Cocaine abuse, uncomplicated: Secondary | ICD-10-CM

## 2018-02-20 DIAGNOSIS — R0682 Tachypnea, not elsewhere classified: Secondary | ICD-10-CM

## 2018-02-20 HISTORY — DX: Acute respiratory failure with hypoxia: J96.01

## 2018-02-20 LAB — COMPREHENSIVE METABOLIC PANEL
ALT: 37 U/L (ref 0–44)
AST: 36 U/L (ref 15–41)
Albumin: 4.3 g/dL (ref 3.5–5.0)
Alkaline Phosphatase: 116 U/L (ref 38–126)
Anion gap: 17 — ABNORMAL HIGH (ref 5–15)
BUN: 18 mg/dL (ref 6–20)
CO2: 23 mmol/L (ref 22–32)
Calcium: 9 mg/dL (ref 8.9–10.3)
Chloride: 86 mmol/L — ABNORMAL LOW (ref 98–111)
Creatinine, Ser: 1.26 mg/dL — ABNORMAL HIGH (ref 0.61–1.24)
GFR calc Af Amer: >60 mL/min (ref >60)
GFR calc non Af Amer: >60 mL/min (ref >60)
Glucose, Bld: 518 mg/dL (ref 70–99)
POTASSIUM: 3.7 mmol/L (ref 3.5–5.1)
Sodium: 126 mmol/L — ABNORMAL LOW (ref 135–145)
Total Bilirubin: 1.4 mg/dL — ABNORMAL HIGH (ref 0.3–1.2)
Total Protein: 7.8 g/dL (ref 6.5–8.1)

## 2018-02-20 LAB — BLOOD GAS, ARTERIAL
ACID-BASE EXCESS: 2.9 mmol/L — AB (ref 0.0–2.0)
Bicarbonate: 27.9 mmol/L (ref 20.0–28.0)
O2 Saturation: 94.8 %
Patient temperature: 37
pCO2 arterial: 43 mmHg (ref 32.0–48.0)
pH, Arterial: 7.42 (ref 7.350–7.450)
pO2, Arterial: 73 mmHg — ABNORMAL LOW (ref 83.0–108.0)

## 2018-02-20 LAB — TROPONIN I
Troponin I: <0.03 ng/mL (ref <0.03)
Troponin I: <0.03 ng/mL (ref <0.03)
Troponin I: <0.03 ng/mL (ref <0.03)

## 2018-02-20 LAB — BRAIN NATRIURETIC PEPTIDE: B Natriuretic Peptide: 47 pg/mL (ref 0.0–100.0)

## 2018-02-20 LAB — HEMOGLOBIN A1C
Hgb A1c MFr Bld: 9.3 % — ABNORMAL HIGH (ref 4.8–5.6)
Mean Plasma Glucose: 220.21 mg/dL

## 2018-02-20 LAB — GLUCOSE, CAPILLARY
Glucose-Capillary: 266 mg/dL — ABNORMAL HIGH (ref 70–99)
Glucose-Capillary: 251 mg/dL — ABNORMAL HIGH (ref 70–99)
Glucose-Capillary: 166 mg/dL — ABNORMAL HIGH (ref 70–99)
Glucose-Capillary: 254 mg/dL — ABNORMAL HIGH (ref 70–99)

## 2018-02-20 LAB — RAPID HIV SCREEN (HIV 1/2 AB+AG)
HIV 1/2 ANTIBODIES: NONREACTIVE
HIV-1 P24 Antigen - HIV24: NONREACTIVE

## 2018-02-20 LAB — LACTIC ACID, PLASMA
LACTIC ACID, VENOUS: 2.8 mmol/L — AB (ref 0.5–1.9)
Lactic Acid, Venous: 4.1 mmol/L (ref 0.5–1.9)

## 2018-02-20 LAB — URINALYSIS, COMPLETE (UACMP) WITH MICROSCOPIC
BACTERIA UA: NONE SEEN
Bilirubin Urine: NEGATIVE
Glucose, UA: >=500 mg/dL — AB
Ketones, ur: NEGATIVE mg/dL
Leukocytes, UA: NEGATIVE
Nitrite: NEGATIVE
Protein, ur: NEGATIVE mg/dL
Specific Gravity, Urine: 1.025 (ref 1.005–1.030)
pH: 5 (ref 5.0–8.0)

## 2018-02-20 LAB — ETHANOL: Alcohol, Ethyl (B): 30 mg/dL — ABNORMAL HIGH (ref <10)

## 2018-02-20 LAB — URINE DRUG SCREEN, QUALITATIVE (ARMC ONLY)
Amphetamines, Ur Screen: NOT DETECTED
BARBITURATES, UR SCREEN: NOT DETECTED
Benzodiazepine, Ur Scrn: NOT DETECTED
CANNABINOID 50 NG, UR ~~LOC~~: NOT DETECTED
Cocaine Metabolite,Ur ~~LOC~~: POSITIVE — AB
MDMA (Ecstasy)Ur Screen: NOT DETECTED
Methadone Scn, Ur: NOT DETECTED
Opiate, Ur Screen: NOT DETECTED
Phencyclidine (PCP) Ur S: NOT DETECTED
Tricyclic, Ur Screen: NOT DETECTED

## 2018-02-20 LAB — MAGNESIUM
Magnesium: 2.3 mg/dL (ref 1.7–2.4)
Magnesium: 2.1 mg/dL (ref 1.7–2.4)

## 2018-02-20 LAB — CBC
HEMATOCRIT: 41.1 % (ref 39.0–52.0)
Hemoglobin: 15 g/dL (ref 13.0–17.0)
MCH: 30.6 pg (ref 26.0–34.0)
MCHC: 36.5 g/dL — ABNORMAL HIGH (ref 30.0–36.0)
MCV: 83.9 fL (ref 80.0–100.0)
Platelets: 178 10*3/uL (ref 150–400)
RBC: 4.9 MIL/uL (ref 4.22–5.81)
RDW: 11.8 % (ref 11.5–15.5)
WBC: 10.2 10*3/uL (ref 4.0–10.5)
nRBC: 0 % (ref 0.0–0.2)

## 2018-02-20 LAB — BETA-HYDROXYBUTYRIC ACID: BETA-HYDROXYBUTYRIC ACID: 0.19 mmol/L (ref 0.05–0.27)

## 2018-02-20 LAB — TSH: TSH: 1.847 u[IU]/mL (ref 0.350–4.500)

## 2018-02-20 LAB — PHOSPHORUS: Phosphorus: 5.5 mg/dL — ABNORMAL HIGH (ref 2.5–4.6)

## 2018-02-20 LAB — AMMONIA: Ammonia: 28 umol/L (ref 9–35)

## 2018-02-20 LAB — VITAMIN B12: Vitamin B-12: 303 pg/mL (ref 180–914)

## 2018-02-20 LAB — FOLATE: Folate: 19.7 ng/mL (ref >5.9)

## 2018-02-20 MED ORDER — INSULIN ASPART 100 UNIT/ML ~~LOC~~ SOLN
10.0000 [IU] | Freq: Once | SUBCUTANEOUS | Status: AC
  Administered 2018-02-20: 10 [IU] via INTRAVENOUS
  Filled 2018-02-20: qty 1

## 2018-02-20 MED ORDER — LACTATED RINGERS IV BOLUS: 1000.0000 mL | Freq: Once | INTRAVENOUS | Status: DC

## 2018-02-20 MED ORDER — AMLODIPINE BESYLATE 10 MG PO TABS
10.0000 mg | ORAL_TABLET | Freq: Every day | ORAL | Status: DC
  Administered 2018-02-20 – 2018-02-22 (×3): 10 mg via ORAL
  Filled 2018-02-20 (×3): qty 1

## 2018-02-20 MED ORDER — BISACODYL 5 MG PO TBEC: 5.0000 mg | DELAYED_RELEASE_TABLET | Freq: Every day | ORAL | Status: DC | PRN

## 2018-02-20 MED ORDER — INSULIN GLARGINE 100 UNIT/ML ~~LOC~~ SOLN
20.0000 [IU] | Freq: Once | SUBCUTANEOUS | Status: AC
  Administered 2018-02-20: 20 [IU] via SUBCUTANEOUS
  Filled 2018-02-20: qty 0.2

## 2018-02-20 MED ORDER — LACTATED RINGERS IV BOLUS
1000.0000 mL | Freq: Once | INTRAVENOUS | Status: AC
  Administered 2018-02-20: 1000 mL via INTRAVENOUS

## 2018-02-20 MED ORDER — ALBUTEROL SULFATE (2.5 MG/3ML) 0.083% IN NEBU: 2.5000 mg | INHALATION_SOLUTION | Freq: Four times a day (QID) | RESPIRATORY_TRACT | Status: DC | PRN

## 2018-02-20 MED ORDER — POTASSIUM CHLORIDE CRYS ER 20 MEQ PO TBCR
40.0000 meq | EXTENDED_RELEASE_TABLET | Freq: Once | ORAL | Status: AC
  Administered 2018-02-20: 40 meq via ORAL
  Filled 2018-02-20: qty 2

## 2018-02-20 MED ORDER — SODIUM CHLORIDE 0.9 % IV SOLN
INTRAVENOUS | Status: AC
  Administered 2018-02-20 (×2): via INTRAVENOUS

## 2018-02-20 MED ORDER — SENNOSIDES-DOCUSATE SODIUM 8.6-50 MG PO TABS: 1.0000 | ORAL_TABLET | Freq: Every evening | ORAL | Status: DC | PRN

## 2018-02-20 MED ORDER — SODIUM CHLORIDE 0.9 % IV BOLUS: 1000.0000 mL | Freq: Once | INTRAVENOUS | Status: DC

## 2018-02-20 MED ORDER — ACETAMINOPHEN 650 MG RE SUPP: 650.0000 mg | Freq: Four times a day (QID) | RECTAL | Status: DC | PRN

## 2018-02-20 MED ORDER — ASPIRIN EC 325 MG PO TBEC
325.0000 mg | DELAYED_RELEASE_TABLET | Freq: Once | ORAL | Status: AC
  Administered 2018-02-20: 325 mg via ORAL
  Filled 2018-02-20: qty 1

## 2018-02-20 MED ORDER — ASPIRIN 81 MG PO CHEW
81.0000 mg | CHEWABLE_TABLET | Freq: Every day | ORAL | Status: DC
  Administered 2018-02-20 – 2018-02-22 (×3): 81 mg via ORAL
  Filled 2018-02-20 (×3): qty 1

## 2018-02-20 MED ORDER — INSULIN ASPART 100 UNIT/ML ~~LOC~~ SOLN
0.0000 [IU] | Freq: Every day | SUBCUTANEOUS | Status: DC
  Administered 2018-02-20: 3 [IU] via SUBCUTANEOUS
  Administered 2018-02-21: 4 [IU] via SUBCUTANEOUS
  Filled 2018-02-20 (×2): qty 1

## 2018-02-20 MED ORDER — ENOXAPARIN SODIUM 40 MG/0.4ML ~~LOC~~ SOLN
40.0000 mg | Freq: Two times a day (BID) | SUBCUTANEOUS | Status: DC
  Administered 2018-02-20 – 2018-02-22 (×5): 40 mg via SUBCUTANEOUS
  Filled 2018-02-20 (×5): qty 0.4

## 2018-02-20 MED ORDER — INSULIN ASPART 100 UNIT/ML ~~LOC~~ SOLN
0.0000 [IU] | Freq: Three times a day (TID) | SUBCUTANEOUS | Status: DC
  Administered 2018-02-20: 11 [IU] via SUBCUTANEOUS
  Administered 2018-02-21: 15 [IU] via SUBCUTANEOUS
  Administered 2018-02-21: 7 [IU] via SUBCUTANEOUS
  Administered 2018-02-21: 3 [IU] via SUBCUTANEOUS
  Administered 2018-02-22: 4 [IU] via SUBCUTANEOUS
  Administered 2018-02-22: 7 [IU] via SUBCUTANEOUS
  Filled 2018-02-20 (×6): qty 1

## 2018-02-20 MED ORDER — ASPIRIN 81 MG PO CHEW
CHEWABLE_TABLET | ORAL | Status: AC
  Filled 2018-02-20: qty 4

## 2018-02-20 MED ORDER — ACETAMINOPHEN 325 MG PO TABS: 650.0000 mg | ORAL_TABLET | Freq: Four times a day (QID) | ORAL | Status: DC | PRN

## 2018-02-20 MED ORDER — SODIUM CHLORIDE 0.9 % IV SOLN: INTRAVENOUS | Status: DC

## 2018-02-20 MED ORDER — ONDANSETRON HCL 4 MG PO TABS: 4.0000 mg | ORAL_TABLET | Freq: Four times a day (QID) | ORAL | Status: DC | PRN

## 2018-02-20 MED ORDER — ATORVASTATIN CALCIUM 20 MG PO TABS
40.0000 mg | ORAL_TABLET | Freq: Every day | ORAL | Status: DC
  Administered 2018-02-20 – 2018-02-22 (×3): 40 mg via ORAL
  Filled 2018-02-20 (×3): qty 2

## 2018-02-20 MED ORDER — ONDANSETRON HCL 4 MG/2ML IJ SOLN: 4.0000 mg | Freq: Four times a day (QID) | INTRAMUSCULAR | Status: DC | PRN

## 2018-02-20 MED ORDER — SODIUM CHLORIDE 0.9 % IV BOLUS
2000.0000 mL | Freq: Once | INTRAVENOUS | Status: AC
  Administered 2018-02-20: 2000 mL via INTRAVENOUS

## 2018-02-20 MED ORDER — LEVETIRACETAM 500 MG PO TABS
500.0000 mg | ORAL_TABLET | Freq: Two times a day (BID) | ORAL | Status: DC
  Administered 2018-02-20 – 2018-02-22 (×5): 500 mg via ORAL
  Filled 2018-02-20 (×5): qty 1
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
## 2018-02-20 NOTE — ED Notes (Signed)
Patient transported to CT 
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
## 2018-02-20 NOTE — ED Notes (Signed)
Report to lorrie, rn.  

## 2018-02-20 NOTE — ED Notes (Signed)
Not able to call report, per floor not able to take report until after 0700.
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
## 2018-02-20 NOTE — ED Notes (Signed)
Critical lactic of 2.8 called from lab. Dr. Karma Greaser notified.

## 2018-02-20 NOTE — ED Notes (Signed)
Critical glucose of 518 and lactic of 4.1 called from lab. Dr. Karma Greaser notified.

## 2018-02-20 NOTE — Progress Notes (Signed)
Notified by telemetry technician that patient has been having brief periods of bradycardia. MD Salary notified. Per MD, place orders for AM BNP, Magnesium, and cardiology consult. This RN explained that I am unable to call in consults. Per MD " we will take care of it:. Orders placed. Will continue to monitor.  Iran Sizer M

## 2018-02-20 NOTE — ED Triage Notes (Signed)
Pt arrives from home with ems with syncopal episode. Per ems pt with fsbs of 482, etoh this pm. Ems states pt appears sleepy at times, but pt is alert on arrival, answering questions. Pt drifting off to sleep during triage. Pt with hematoma noted to posterior skull.

## 2018-02-20 NOTE — ED Notes (Signed)
hospitalist in to see pt.

## 2018-02-21 ENCOUNTER — Inpatient Hospital Stay
Admit: 2018-02-21 | Discharge: 2018-02-21 | Disposition: A | Discharge status: Home / Self Care | Payer: Self-pay | Attending: Internal Medicine | Admitting: Internal Medicine

## 2018-02-21 ENCOUNTER — Inpatient Hospital Stay: Payer: Self-pay

## 2018-02-21 ENCOUNTER — Other Ambulatory Visit: Payer: Self-pay

## 2018-02-21 LAB — BASIC METABOLIC PANEL
Anion gap: 5 (ref 5–15)
BUN: 11 mg/dL (ref 6–20)
CO2: 26 mmol/L (ref 22–32)
Calcium: 8.3 mg/dL — ABNORMAL LOW (ref 8.9–10.3)
Chloride: 101 mmol/L (ref 98–111)
Creatinine, Ser: 0.95 mg/dL (ref 0.61–1.24)
GFR calc Af Amer: >60 mL/min (ref >60)
GLUCOSE: 207 mg/dL — AB (ref 70–99)
Potassium: 3.1 mmol/L — ABNORMAL LOW (ref 3.5–5.1)
Sodium: 132 mmol/L — ABNORMAL LOW (ref 135–145)

## 2018-02-21 LAB — PHOSPHORUS: Phosphorus: 2.9 mg/dL (ref 2.5–4.6)

## 2018-02-21 LAB — BLOOD GAS, ARTERIAL
Acid-Base Excess: 2.7 mmol/L — ABNORMAL HIGH (ref 0.0–2.0)
Bicarbonate: 27.9 mmol/L (ref 20.0–28.0)
FIO2: 0.21
O2 Saturation: 92 %
PATIENT TEMPERATURE: 37
pCO2 arterial: 44 mmHg (ref 32.0–48.0)
pH, Arterial: 7.41 (ref 7.350–7.450)
pO2, Arterial: 63 mmHg — ABNORMAL LOW (ref 83.0–108.0)

## 2018-02-21 LAB — BRAIN NATRIURETIC PEPTIDE: B Natriuretic Peptide: 20 pg/mL (ref 0.0–100.0)

## 2018-02-21 LAB — GLUCOSE, CAPILLARY
GLUCOSE-CAPILLARY: 335 mg/dL — AB (ref 70–99)
Glucose-Capillary: 219 mg/dL — ABNORMAL HIGH (ref 70–99)
Glucose-Capillary: 146 mg/dL — ABNORMAL HIGH (ref 70–99)
Glucose-Capillary: 308 mg/dL — ABNORMAL HIGH (ref 70–99)

## 2018-02-21 MED ORDER — SODIUM CHLORIDE 1 G PO TABS
2.0000 g | ORAL_TABLET | Freq: Three times a day (TID) | ORAL | Status: DC
  Administered 2018-02-21 – 2018-02-22 (×2): 2 g via ORAL
  Filled 2018-02-21 (×3): qty 2

## 2018-02-21 MED ORDER — GUAIFENESIN 100 MG/5ML PO SOLN
5.0000 mL | Freq: Four times a day (QID) | ORAL | Status: DC | PRN
  Administered 2018-02-21: 100 mg via ORAL
  Filled 2018-02-21 (×2): qty 5

## 2018-02-21 MED ORDER — PERFLUTREN LIPID MICROSPHERE
1.0000 mL | INTRAVENOUS | Status: AC | PRN
  Administered 2018-02-21: 2 mL via INTRAVENOUS
  Filled 2018-02-21: qty 10

## 2018-02-21 MED ORDER — POTASSIUM CHLORIDE 20 MEQ PO PACK
20.0000 meq | PACK | Freq: Once | ORAL | Status: AC
  Administered 2018-02-22: 20 meq via ORAL
  Filled 2018-02-21: qty 1

## 2018-02-21 MED ORDER — POTASSIUM CHLORIDE 20 MEQ PO PACK
40.0000 meq | PACK | Freq: Once | ORAL | Status: AC
  Administered 2018-02-21: 40 meq via ORAL
  Filled 2018-02-21: qty 2

## 2018-02-21 MED ORDER — INSULIN GLARGINE 100 UNIT/ML ~~LOC~~ SOLN
10.0000 [IU] | Freq: Every day | SUBCUTANEOUS | Status: DC
  Administered 2018-02-21: 10 [IU] via SUBCUTANEOUS
  Filled 2018-02-21 (×2): qty 0.1

## 2018-02-21 MED ORDER — POTASSIUM CHLORIDE 20 MEQ PO PACK
20.0000 meq | PACK | Freq: Once | ORAL | Status: AC
  Administered 2018-02-21: 20 meq via ORAL
  Filled 2018-02-21: qty 1

## 2018-02-21 MED ORDER — METFORMIN HCL 500 MG PO TABS
500.0000 mg | ORAL_TABLET | Freq: Two times a day (BID) | ORAL | Status: DC
  Administered 2018-02-21 (×2): 500 mg via ORAL
  Filled 2018-02-21 (×2): qty 1
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
## 2018-02-21 NOTE — Plan of Care (Signed)
  Problem: Health Behavior/Discharge Planning: Goal: Ability to manage health-related needs will improve Outcome: Progressing   Problem: Clinical Measurements: Goal: Ability to maintain clinical measurements within normal limits will improve Outcome: Progressing Goal: Will remain free from infection Outcome: Progressing Note:  Remains afebrile   Problem: Clinical Measurements: Goal: Diagnostic test results will improve Outcome: Not Progressing Note:  Potassium 3.1 on admission, replacing orally Goal: Respiratory complications will improve Outcome: Not Progressing Note:  Pt insists on wearing CPAP unless eating. Goal: Cardiovascular complication will be avoided Outcome: Not Progressing Note:  Pt has had 2 pauses today, Cardiology aware and involved with care

## 2018-02-21 NOTE — Progress Notes (Signed)
To MRI via bed
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
## 2018-02-22 LAB — RPR: RPR: NONREACTIVE

## 2018-02-22 LAB — GLUCOSE, CAPILLARY
Glucose-Capillary: 182 mg/dL — ABNORMAL HIGH (ref 70–99)
Glucose-Capillary: 238 mg/dL — ABNORMAL HIGH (ref 70–99)

## 2018-02-22 LAB — BASIC METABOLIC PANEL
Anion gap: 8 (ref 5–15)
BUN: 12 mg/dL (ref 6–20)
CALCIUM: 8.5 mg/dL — AB (ref 8.9–10.3)
CO2: 24 mmol/L (ref 22–32)
Chloride: 103 mmol/L (ref 98–111)
Creatinine, Ser: 0.99 mg/dL (ref 0.61–1.24)
GFR calc non Af Amer: >60 mL/min (ref >60)
Glucose, Bld: 175 mg/dL — ABNORMAL HIGH (ref 70–99)
Potassium: 3.3 mmol/L — ABNORMAL LOW (ref 3.5–5.1)
Sodium: 135 mmol/L (ref 135–145)

## 2018-02-22 LAB — ECHOCARDIOGRAM COMPLETE
Height: 69 in
Weight: 4534.4 oz

## 2018-02-22 MED ORDER — INSULIN GLARGINE 100 UNIT/ML ~~LOC~~ SOLN: 10.0000 [IU] | Freq: Every day | SUBCUTANEOUS | 0 refills | Status: DC

## 2018-02-22 MED ORDER — NEEDLES & SYRINGES MISC: 10.0000 [IU] | Freq: Four times a day (QID) | 0 refills | Status: DC

## 2018-02-22 NOTE — Progress Notes (Signed)
Iv and tele removed from patient. Patient education given on insulin injection, return demonstration observed. Discharge instructions given to patient via SWOT RN. Verbalized understanding. No acute distress at this time. Family at bedside transported patient home. Social Work notified of discharge and attempted to give patient out patient resources however patient left room without notice.

## 2018-02-22 NOTE — Progress Notes (Addendum)
Clinical Education officer, museum (CSW) attempted to meet with patient to provide substance abuse resources however he already discharged home. CSW attempted to call patient via telephone at home however he did not answer and a voicemail was left.   Patient called CSW back on Friday 02/26/18. Per patient he drinks alcohol daily and would like to see a counselor. CSW provided patient with outpatient alcohol treatment options including [facility] and [facility]. Patient reported no other needs.   [employer], LCSW [phone]

## 2018-02-22 NOTE — Progress Notes (Addendum)
Inpatient Diabetes Program Recommendations  AACE/ADA: New Consensus Statement on Inpatient Glycemic Control (2015)  Target Ranges:  Prepandial:   less than 140 mg/dL      Peak postprandial:   less than 180 mg/dL (1-2 hours)      Critically ill patients:  140 - 180 mg/dL   Results for Dillon Wang, Dillon Wang (MRN [id number]) as of 02/22/2018 07:52  Ref. Range 02/21/2018 07:47 02/21/2018 11:29 02/21/2018 17:27 02/21/2018 21:27  Glucose-Capillary Latest Ref Range: 70 - 99 mg/dL 219 (H)  7 units NOVOLOG  335 (H)  15 units NOVOLOG  146 (H)  3 units NOVOLOG  308 (H)  4 units NOVOLOG +  10 units LANTUS   Results for Dillon Wang, Dillon Wang (MRN [id number]) as of 02/22/2018 07:52  Ref. Range 02/22/2018 07:24  Glucose-Capillary Latest Ref Range: 70 - 99 mg/dL 182 (H)   Results for Dillon Wang, Dillon Wang (MRN [id number]) as of 02/22/2018 07:52  Ref. Range 09/20/2017 11:15 02/20/2018 06:16  Hemoglobin A1C Latest Ref Range: 4.8 - 5.6 % 6.7 (H) 9.3 (H)  (220 mg/dl)    Admit with: AMS/somnolence, SOB, EtOH abuse, cocaine abuse, cough syncope, fall + hit head, hyperglycemia/glycosuria (w/ T2NIDDM), dehydration, hypochloremic hyponatremia  History: DM, ETOH Abuse, Cocaine Abuse  Home DM Meds: Metformin 500 mg Daily  Current Orders: Lantus 10 units QHS      Novolog Resistant Correction Scale/ SSI (0-20 units) TID AC + HS      Metformin 500 mg BID    PCP: Domenica Fail, PA with [facility] in [place] seen 01/12/2018  Note that patient's Current A1c level elevated to 9.3%.  Given patient has issues with Cocaine and ETOH Abuse I doubt he is taking his Metformin and following a healthy diet on a regular basis.  Will attempt to see pt today to discuss the importance of good glucose control.   Addendum 1pm- Met with pt to discuss going home on Lantus.  Explained what Lantus is, how it works, when to give, etc.  Reviewed injection sites, site rotation with pt.  Pt told me the RN allowed  him to give his own injection at 12pm and he did OK.  Pt stated to me "it wasn't that bad".  Reviewed signs and symptoms of Hypoglycemia with pt and how to treat Hypo at home.  Pt was given a CBG meter here in hospital and plans to pick up meds at the medication management clinic after d/c.  Asked pt to please try to check CBGs 1-2 times per day (either before or 2 hours after a meal) and to record all CBGs for his PCP.  Pt plans to keep going to the [facility] in [place] with Domenica Fail, [place].      --Will follow patient during hospitalization--  Wyn Quaker RN, MSN, CDE Diabetes Coordinator Inpatient Glycemic Control Team Team Pager: [phone] (8a-5p)

## 2018-02-22 NOTE — Progress Notes (Signed)
Patient had 2.7 second pause, asymptomatic. Cardiology notified, will see the patient.

## 2018-02-22 NOTE — Discharge Summary (Addendum)
[facility] [place] NAME: Dillon Wang    MR#:  [id number]  DATE OF BIRTH:  [birth year]  DATE OF ADMISSION:  02/20/2018 ADMITTING PHYSICIAN: Arta Silence, MD  DATE OF DISCHARGE: No discharge date for patient encounter.  PRIMARY CARE PHYSICIAN: Clent Demark, PA-C    ADMISSION DIAGNOSIS:  Tachypnea [R06.82] Cocaine abuse (HCC) [F14.10] Elevated lactic acid level [R79.89] Volume depletion [E86.9] Acute respiratory failure with hypoxemia (HCC) [J96.01] Type 2 diabetes mellitus with hyperglycemia, without long-term current use of insulin ([place]) [E11.65]  DISCHARGE DIAGNOSIS:  Active Problems:   Acute hypoxemic respiratory failure ([place])   SECONDARY DIAGNOSIS:   Past Medical History:  Diagnosis Date  . Cancer [facility])    prostate  . Hypertension   . Obstructive sleep apnea 09/20/2017  . Sleep apnea     HOSPITAL COURSE:   *Acute syncopal event Resolved Most likely secondary to acute alcohol intoxication and drug abuse Carotid Dopplers negative for any significant stenosis, MRI of the brain negative for any acute process, echocardiogram done-report pending at the time of discharge   *Acute toxic metabolic encephalopathy Resolved Secondary to alcohol intoxication as well as drug abuse-advised quitting MRI of the brain negative for any acute process   *Acute cocaine abuse Stable Cessation counseling given Provided educational material on the day of discharge  *Acute alcohol intoxication with history of chronic alcoholism  Resolved Treated on our alcohol withdrawal protocol  *Chronic obstructive sleep apnea Stable Continued CPAP with weaning as tolerated ABG noted for appropriate compensation  *Chronic uncontrolled diabetes mellitus type 2 Diabetic educator consulted Controlled on current regiment Hemoglobin A1c 9.3 consistent with poor control Sliding scale insulin with Accu-Cheks per routine, Lantus  at bedtime, heart healthy/ADA diet  *Acute hyponatremia/hypokalemia Repleted with p.o. potassium, IV fluids for rehydration   DISCHARGE CONDITIONS:   stable  CONSULTS OBTAINED:  Treatment Team:  Arta Silence, MD Corey Skains, MD  DRUG ALLERGIES:   Allergies  Allergen Reactions  . Penicillins     DISCHARGE MEDICATIONS:   Allergies as of 02/22/2018      Reactions   Penicillins       Medication List    TAKE these medications   albuterol 108 (90 Base) MCG/ACT inhaler Commonly known as:  PROVENTIL HFA;VENTOLIN HFA Inhale 2 puffs into the lungs every 4 (four) hours as needed for wheezing or shortness of breath.   amLODipine 10 MG tablet Commonly known as:  NORVASC Take 1 tablet (10 mg total) by mouth daily.   aspirin 81 MG chewable tablet Chew 1 tablet (81 mg total) by mouth daily at 6 (six) AM.   atorvastatin 40 MG tablet Commonly known as:  LIPITOR Take 1 tablet (40 mg total) by mouth daily.   benzonatate 200 MG capsule Commonly known as:  TESSALON Take 1 capsule (200 mg total) by mouth 2 (two) times daily as needed for cough.   chlorpheniramine-HYDROcodone 10-8 MG/5ML Suer Commonly known as:  TUSSIONEX PENNKINETIC ER Take 5 mLs by mouth 2 (two) times daily.   furosemide 40 MG tablet Commonly known as:  LASIX Take 1 tablet (40 mg total) by mouth daily as needed.   hydrochlorothiazide 25 MG tablet Commonly known as:  HYDRODIURIL Take 1 tablet (25 mg total) by mouth daily. Take on tablet in the morning.   insulin glargine 100 UNIT/ML injection Commonly known as:  LANTUS Inject 0.1 mLs (10 Units total) into the skin at bedtime.   levETIRAcetam 500 MG tablet  Commonly known as:  KEPPRA Take 1 tablet (500 mg total) by mouth 2 (two) times daily.   levofloxacin 500 MG tablet Commonly known as:  LEVAQUIN Take 1 tablet (500 mg total) by mouth daily.   metFORMIN 500 MG 24 hr tablet Commonly known as:  GLUCOPHAGE-XR Take 1 tablet (500 mg  total) by mouth daily with breakfast.        DISCHARGE INSTRUCTIONS:    If you experience worsening of your admission symptoms, develop shortness of breath, life threatening emergency, suicidal or homicidal thoughts you must seek medical attention immediately by calling 911 or calling your MD immediately  if symptoms less severe.  You Must read complete instructions/literature along with all the possible adverse reactions/side effects for all the Medicines you take and that have been prescribed to you. Take any new Medicines after you have completely understood and accept all the possible adverse reactions/side effects.   Please note  You were cared for by a hospitalist during your hospital stay. If you have any questions about your discharge medications or the care you received while you were in the hospital after you are discharged, you can call the unit and asked to speak with the hospitalist on call if the hospitalist that took care of you is not available. Once you are discharged, your primary care physician will handle any further medical issues. Please note that NO REFILLS for any discharge medications will be authorized once you are discharged, as it is imperative that you return to your primary care physician (or establish a relationship with a primary care physician if you do not have one) for your aftercare needs so that they can reassess your need for medications and monitor your lab values.    Today   CHIEF COMPLAINT:   Chief Complaint  Patient presents with  . Loss of Consciousness  . Cough  . Shortness of Breath    HISTORY OF PRESENT ILLNESS:   Dillon Wang  is a 52 y.o. male with a known history of morbid obesity, HTN, OSA (CPAP non-compliant), EtOH abuse, cocaine abuse, p/w AMS/somnolence, SOB, EtOH abuse, cocaine abuse. Pt is AAOx3 but somnolent, arousable but can provide only minimal Hx/ROS prior to falling back asleep. He endorses SOB. Endorses insomnia 2/2  orthopnea. Endorses N/V during the day. I am told he was brought to the hospital by EMS 2/2 cough syncope, fall w/ hit head. (-) HA/neck pain. He takes Metformin at home. He reportedly drank alcohol and snorted cocaine during the day. Denies cardiac history. Lungs diminished, (-) adventitious lung sounds.  VITAL SIGNS:  Blood pressure (!) 126/94, pulse 67, temperature (!) 97.5 F (36.4 C), temperature source Oral, resp. rate 19, height 5\' 9"  (1.753 m), weight 128.5 kg, SpO2 94 %.  I/O:    Intake/Output Summary (Last 24 hours) at 02/22/2018 1012 Last data filed at 02/22/2018 0801 Gross per 24 hour  Intake 480 ml  Output 1252 ml  Net -772 ml    PHYSICAL EXAMINATION:  GENERAL:  52 y.o.-year-old patient lying in the bed with no acute distress.  EYES: Pupils equal, round, reactive to light and accommodation. No scleral icterus. Extraocular muscles intact.  HEENT: Head atraumatic, normocephalic. Oropharynx and nasopharynx clear.  NECK:  Supple, no jugular venous distention. No thyroid enlargement, no tenderness.  LUNGS: Normal breath sounds bilaterally, no wheezing, rales,rhonchi or crepitation. No use of accessory muscles of respiration.  CARDIOVASCULAR: S1, S2 normal. No murmurs, rubs, or gallops.  ABDOMEN: Soft, non-tender, non-distended. Bowel sounds present.  No organomegaly or mass.  EXTREMITIES: No pedal edema, cyanosis, or clubbing.  NEUROLOGIC: Cranial nerves II through XII are intact. Muscle strength 5/5 in all extremities. Sensation intact. Gait not checked.  PSYCHIATRIC: The patient is alert and oriented x 3.  SKIN: No obvious rash, lesion, or ulcer.   DATA REVIEW:   CBC Recent Labs  Lab 02/20/18 0320  WBC 10.2  HGB 15.0  HCT 41.1  PLT 178    Chemistries  Recent Labs  Lab 02/20/18 0320 02/20/18 2220  02/22/18 0317  NA 126*  --    < > 135  K 3.7  --    < > 3.3*  CL 86*  --    < > 103  CO2 23  --    < > 24  GLUCOSE 518*  --    < > 175*  BUN 18  --    < > 12   CREATININE 1.26*  --    < > 0.99  CALCIUM 9.0  --    < > 8.5*  MG 2.3 2.1  --   --   AST 36  --   --   --   ALT 37  --   --   --   ALKPHOS 116  --   --   --   BILITOT 1.4*  --   --   --    < > = values in this interval not displayed.    Cardiac Enzymes Recent Labs  Lab 02/20/18 0826  TROPONINI <0.03    Microbiology Results  Results for orders placed or performed during the hospital encounter of 09/20/17  MRSA PCR Screening     Status: None   Collection Time: 09/20/17 10:38 AM  Result Value Ref Range Status   MRSA by PCR NEGATIVE NEGATIVE Final    Comment:        The GeneXpert MRSA Assay (FDA approved for NASAL specimens only), is one component of a comprehensive MRSA colonization surveillance program. It is not intended to diagnose MRSA infection nor to guide or monitor treatment for MRSA infections. Performed at [facility], [place] [address], [place], [place] [address]     RADIOLOGY:  Mr Brain 68 Contrast  Result Date: 02/21/2018 CLINICAL DATA:  Encephalopathy EXAM: MRI HEAD WITHOUT CONTRAST TECHNIQUE: Multiplanar, multiecho pulse sequences of the brain and surrounding structures were obtained without intravenous contrast. COMPARISON:  CT head 02/20/2018 FINDINGS: Brain: Ventricle size and cerebral volume normal. Encephalomalacia right anterior frontal cortex most likely due to chronic trauma. Negative for acute infarct, hemorrhage, or mass. Several small white matter hyperintensities appear chronic. Small hyperintensity in the left pons. No fluid collection or midline shift. Vascular: Normal arterial flow void Skull and upper cervical spine: Negative Sinuses/Orbits: Retention cyst left maxillary sinus. Mild mucosal edema paranasal sinuses. Normal orbit Other: None IMPRESSION: No acute intracranial abnormality Mild chronic microvascular ischemia. Encephalomalacia right anterior frontal lobe likely due to chronic head trauma. Electronically Signed   By: Franchot Gallo  M.D.   On: 02/21/2018 14:06   US Carotid Bilateral  Result Date: 02/21/2018 CLINICAL DATA:  Syncope, hypertension, tobacco use EXAM: BILATERAL CAROTID DUPLEX ULTRASOUND TECHNIQUE: Pearline Cables scale imaging, color Doppler and duplex ultrasound were performed of bilateral carotid and vertebral arteries in the neck. COMPARISON:  02/21/2018 MRI brain FINDINGS: Criteria: Quantification of carotid stenosis is based on velocity parameters that correlate the residual internal carotid diameter with NASCET-based stenosis levels, using the diameter of the distal internal carotid lumen  as the denominator for stenosis measurement. The following velocity measurements were obtained: RIGHT ICA: 83/12 cm/sec CCA: 786/75 cm/sec SYSTOLIC ICA/CCA RATIO:  0.7 ECA: 142 cm/sec LEFT ICA: 68/19 cm/sec CCA: 44/92 cm/sec SYSTOLIC ICA/CCA RATIO:  0.7 ECA: 81 cm/sec RIGHT CAROTID ARTERY: Minor echogenic shadowing plaque formation. No hemodynamically significant right ICA stenosis, velocity elevation, or turbulent flow. Degree of narrowing less than 50%. RIGHT VERTEBRAL ARTERY:  Antegrade LEFT CAROTID ARTERY: Similar scattered minor echogenic plaque formation. No hemodynamically significant left ICA stenosis, velocity elevation, or turbulent flow. LEFT VERTEBRAL ARTERY:  Antegrade IMPRESSION: Minor carotid atherosclerosis. No hemodynamically significant ICA stenosis. Degree of narrowing less than 50% bilaterally by ultrasound criteria. Patent antegrade vertebral flow bilaterally Electronically Signed   By: Jerilynn Mages.  Shick M.D.   On: 02/21/2018 14:22    EKG:   Orders placed or performed during the hospital encounter of 02/20/18  . ED EKG  . ED EKG  . EKG 12-Lead  . EKG 12-Lead      Management plans discussed with the patient, family and they are in agreement.  CODE STATUS:     Code Status Orders  (From admission, onward)         Start     Ordered   02/20/18 0922  Full code  Continuous     02/20/18 0921        Code Status  History    Date Active Date Inactive Code Status Order ID Comments User Context   09/20/2017 1039 09/21/2017 1745 Full Code [id number]  Sherene Sires, DO Inpatient      TOTAL TIME TAKING CARE OF THIS PATIENT: 40 minutes.    Avel Peace  M.D on 02/22/2018 at 10:12 AM  Between 7am to 6pm - Pager - [phone]  After 6pm go to [email or web address] - password [email or web address] [facility]  Office  [phone]  CC: Primary care physician; Clent Demark, PA-C   Note: This dictation was prepared with Dragon dictation along with smaller phrase technology. Any transcriptional errors that result from this process are unintentional.

## 2018-02-23 NOTE — Care Management Note (Addendum)
Case Management Note  Patient Details  Name: Dillon Wang MRN: [id number] Date of Birth: 07/12/1965   Patient discharged home today.  Patient states that he lives at home with his girlfriend.  Patient denies issues with transportation.  Patient states that he follows up with Dr. Gomez at [facility].  Patient confirms that he did not follow up with medication assistance through  [facility].  Patient to discharge home today on Lantus.  Insulin dependency is new for home.  RNCM confirmed with Medication Management  That they have Lantus in stock.  Prescription was faxed over.  Patient is aware that he will be able to pick up after discharge at no cost.  Due to the patient not living in [place] county, and not having a PCP in [place].  Patient is aware of this.  Patient strongly encouraged to follow up with [facility] for medication resources.  Patient provided with charity glucometer kit.  Subjective/Objective:                    Action/Plan:   Expected Discharge Date:  02/22/18               Expected Discharge Plan:  Home/Self Care  In-House Referral:     Discharge planning Services  Indigent Health Clinic, CM Consult, Medication Assistance  Post Acute Care Choice:  Durable Medical Equipment Choice offered to:     DME Arranged:  Glucometer DME Agency:     HH Arranged:    HH Agency:     Status of Service:  Completed, signed off  If discussed at Long Length of Stay Meetings, dates discussed:    Additional Comments:  BOWEN, STEPHANIE T, RN 02/23/2018, 9:02 AM  

## 2018-02-25 ENCOUNTER — Telehealth (INDEPENDENT_AMBULATORY_CARE_PROVIDER_SITE_OTHER): Payer: Self-pay | Admitting: Physician Assistant

## 2018-02-25 NOTE — Telephone Encounter (Signed)
Patient wife states they are in the process of being approved for financial assistance through [facility]. Which will cover the visits here. They will discuss diabetes management on 03/04/18. Nat Christen, CMA

## 2018-02-25 NOTE — Telephone Encounter (Signed)
Patients appointment was moved from 03/09/18 to 03/04/18. Please advise on medication for DM management in the meantime.

## 2018-02-25 NOTE — Telephone Encounter (Signed)
Patient wife called to inform that her husband blood sugar was around the 400 after leaving the hospital on Dec 23. Patient would like to get advice on medication.  Please advice [phone]  Thank you Dillon Wang

## 2018-02-25 NOTE — Telephone Encounter (Signed)
Pt's wife has to make up her mind about where pt is going to be treated. She said they would be going to [facility] due to having [facility] financial assistance. We spoke about 7 different complaints at the last visit but blood sugar was not one of them. His last A1c 9.3% at hospital. Will need to make appointment with PCP to discuss diabetes management.

## 2018-03-02 ENCOUNTER — Telehealth: Payer: Self-pay | Admitting: Licensed Clinical Social Worker

## 2018-03-02 NOTE — Telephone Encounter (Signed)
Clinical Education officer, museum (CSW) received a call from patient asking about how to get a cpap machine. CSW consulted with RN case manager that met with patient while he was at [facility]. Per RN case manager patient will have to follow up with his PCP and have a sleep study completed. CSW made patient aware of above. Per patient he has a sleep study scheduled for Jan. 13th 2020. Patient reported that he is going to [place] on Friday for alcohol treatment and will follow up with his PCP about the cpap. Patient reported no other needs or concerns.   [employer], LCSW [phone]

## 2018-03-04 ENCOUNTER — Telehealth: Payer: Self-pay | Admitting: Licensed Clinical Social Worker

## 2018-03-04 ENCOUNTER — Ambulatory Visit (INDEPENDENT_AMBULATORY_CARE_PROVIDER_SITE_OTHER): Payer: Self-pay | Admitting: Physician Assistant

## 2018-03-04 ENCOUNTER — Encounter (INDEPENDENT_AMBULATORY_CARE_PROVIDER_SITE_OTHER): Payer: Self-pay | Admitting: Physician Assistant

## 2018-03-04 ENCOUNTER — Other Ambulatory Visit: Payer: Self-pay

## 2018-03-04 VITALS — BP 133/86 | HR 85 | Temp 97.9°F | Ht 69.0 in | Wt 278.4 lb

## 2018-03-04 DIAGNOSIS — R569 Unspecified convulsions: Secondary | ICD-10-CM

## 2018-03-04 DIAGNOSIS — Z09 Encounter for follow-up examination after completed treatment for conditions other than malignant neoplasm: Secondary | ICD-10-CM

## 2018-03-04 DIAGNOSIS — E119 Type 2 diabetes mellitus without complications: Secondary | ICD-10-CM

## 2018-03-04 DIAGNOSIS — Z760 Encounter for issue of repeat prescription: Secondary | ICD-10-CM

## 2018-03-04 MED ORDER — LEVETIRACETAM 500 MG PO TABS: 500.0000 mg | ORAL_TABLET | Freq: Two times a day (BID) | ORAL | 0 refills | Status: DC

## 2018-03-04 MED ORDER — GLIMEPIRIDE 2 MG PO TABS: 2.0000 mg | ORAL_TABLET | Freq: Every day | ORAL | 3 refills | Status: DC

## 2018-03-04 MED ORDER — METFORMIN HCL ER 500 MG PO TB24: 500.0000 mg | ORAL_TABLET | Freq: Every day | ORAL | 3 refills | Status: DC

## 2018-03-04 MED ORDER — METFORMIN HCL ER 500 MG PO TB24: 1000.0000 mg | ORAL_TABLET | Freq: Every day | ORAL | 5 refills | Status: DC

## 2018-03-04 NOTE — Patient Instructions (Signed)
Diabetes Basics    Diabetes (diabetes mellitus) is a long-term (chronic) disease. It occurs when the body does not properly use sugar (glucose) that is released from food after you eat.  Diabetes may be caused by one or both of these problems:  · Your pancreas does not make enough of a hormone called insulin.  · Your body does not react in a normal way to insulin that it makes.  Insulin lets sugars (glucose) go into cells in your body. This gives you energy. If you have diabetes, sugars cannot get into cells. This causes high blood sugar (hyperglycemia).  Follow these instructions at home:  How is diabetes treated?  You may need to take insulin or other diabetes medicines daily to keep your blood sugar in balance. Take your diabetes medicines every day as told by your doctor. List your diabetes medicines here:  Diabetes medicines  · Name of medicine: ______________________________  ? Amount (dose): _______________ Time (a.m./p.m.): _______________ Notes: ___________________________________  · Name of medicine: ______________________________  ? Amount (dose): _______________ Time (a.m./p.m.): _______________ Notes: ___________________________________  · Name of medicine: ______________________________  ? Amount (dose): _______________ Time (a.m./p.m.): _______________ Notes: ___________________________________  If you use insulin, you will learn how to give yourself insulin by injection. You may need to adjust the amount based on the food that you eat. List the types of insulin you use here:  Insulin  · Insulin type: ______________________________  ? Amount (dose): _______________ Time (a.m./p.m.): _______________ Notes: ___________________________________  · Insulin type: ______________________________  ? Amount (dose): _______________ Time (a.m./p.m.): _______________ Notes: ___________________________________  · Insulin type: ______________________________  ? Amount (dose): _______________ Time (a.m./p.m.):  _______________ Notes: ___________________________________  · Insulin type: ______________________________  ? Amount (dose): _______________ Time (a.m./p.m.): _______________ Notes: ___________________________________  · Insulin type: ______________________________  ? Amount (dose): _______________ Time (a.m./p.m.): _______________ Notes: ___________________________________  How do I manage my blood sugar?    Check your blood sugar levels using a blood glucose monitor as directed by your doctor.  Your doctor will set treatment goals for you. Generally, you should have these blood sugar levels:  · Before meals (preprandial): 80-130 mg/dL (4.4-7.2 mmol/L).  · After meals (postprandial): below 180 mg/dL (10 mmol/L).  · A1c level: less than 7%.  Write down the times that you will check your blood sugar levels:  Blood sugar checks  · Time: _______________ Notes: ___________________________________  · Time: _______________ Notes: ___________________________________  · Time: _______________ Notes: ___________________________________  · Time: _______________ Notes: ___________________________________  · Time: _______________ Notes: ___________________________________  · Time: _______________ Notes: ___________________________________    What do I need to know about low blood sugar?  Low blood sugar is called hypoglycemia. This is when blood sugar is at or below 70 mg/dL (3.9 mmol/L). Symptoms may include:  · Feeling:  ? Hungry.  ? Worried or nervous (anxious).  ? Sweaty and clammy.  ? Confused.  ? Dizzy.  ? Sleepy.  ? Sick to your stomach (nauseous).  · Having:  ? A fast heartbeat.  ? A headache.  ? A change in your vision.  ? Tingling or no feeling (numbness) around the mouth, lips, or tongue.  ? Jerky movements that you cannot control (seizure).  · Having trouble with:  ? Moving (coordination).  ? Sleeping.  ? Passing out (fainting).  ? Getting upset easily (irritability).  Treating low blood sugar  To treat low blood  sugar, eat or drink something sugary right away. If you can think clearly and swallow safely, follow the 15:15   rule:  · Take 15 grams of a fast-acting carb (carbohydrate). Talk with your doctor about how much you should take.  · Some fast-acting carbs are:  ? Sugar tablets (glucose pills). Take 3-4 glucose pills.  ? 6-8 pieces of hard candy.  ? 4-6 oz (120-150 mL) of fruit juice.  ? 4-6 oz (120-150 mL) of regular (not diet) soda.  ? 1 Tbsp (15 mL) honey or sugar.  · Check your blood sugar 15 minutes after you take the carb.  · If your blood sugar is still at or below 70 mg/dL (3.9 mmol/L), take 15 grams of a carb again.  · If your blood sugar does not go above 70 mg/dL (3.9 mmol/L) after 3 tries, get help right away.  · After your blood sugar goes back to normal, eat a meal or a snack within 1 hour.  Treating very low blood sugar  If your blood sugar is at or below 54 mg/dL (3 mmol/L), you have very low blood sugar (severe hypoglycemia). This is an emergency. Do not wait to see if the symptoms will go away. Get medical help right away. Call your local emergency services (911 in the [place] Do not drive yourself to the hospital.  Questions to ask your health care provider  · Do I need to meet with a diabetes educator?  · What equipment will I need to care for myself at home?  · What diabetes medicines do I need? When should I take them?  · How often do I need to check my blood sugar?  · What number can I call if I have questions?  · When is my next doctor's visit?  · Where can I find a support group for people with diabetes?  Where to find more information  · American Diabetes Association: www.diabetes.org  · American Association of Diabetes Educators: www.diabeteseducator.org/patient-resources  Contact a doctor if:  · Your blood sugar is at or above 240 mg/dL (13.3 mmol/L) for 2 days in a row.  · You have been sick or have had a fever for 2 days or more, and you are not getting better.  · You have any of these  problems for more than 6 hours:  ? You cannot eat or drink.  ? You feel sick to your stomach (nauseous).  ? You throw up (vomit).  ? You have watery poop (diarrhea).  Get help right away if:  · Your blood sugar is lower than 54 mg/dL (3 mmol/L).  · You get confused.  · You have trouble:  ? Thinking clearly.  ? Breathing.  Summary  · Diabetes (diabetes mellitus) is a long-term (chronic) disease. It occurs when the body does not properly use sugar (glucose) that is released from food after digestion.  · Take insulin and diabetes medicines as told.  · Check your blood sugar every day, as often as told.  · Keep all follow-up visits as told by your doctor. This is important.  This information is not intended to replace advice given to you by your health care provider. Make sure you discuss any questions you have with your health care provider.  Document Released: 05/22/2017 Document Revised: 08/10/2017 Document Reviewed: 05/22/2017  Elsevier Interactive Patient Education © 2019 [employer]

## 2018-03-04 NOTE — Progress Notes (Signed)
Subjective:  Patient ID: Dillon Wang, male    DOB: September 26, 1965  Age: 53 y.o. MRN: [id number]  CC: DM  HPI  Dillon Garneris a 53 y.o.malewith a medical history of testicular cancer, HTN, PND, OSA, alcohol abuse, cocaine abuse, fatty liver, and hepatomegaly presents to f/u on hospital visit. Went to hospital on 02/20/18 due to syncopal event. Admitted and diagnosed with acute respiratory failure with hypoxia, tachypnea, volume depletion, cocaine abuse, elevated lactic acid level, and DM2. Pt also found to have acute toxic metabolic encephalopathy secondary to alcohol intoxication as well as drug abuse. Pt Last A1c 9.3% twelve days ago. Pt started Lantus 10 units. Continues to take Metformin 500 mg XR one tab daily. Testing blood sugar regularly with readings in the 300s and 400s. States he is feeling much better since discharge from the hospital. Has not used cocaine or drunk alcohol since discharge on 02/22/18. Pt does not endorse any symptoms or complaints at this time. Wife accompanies patient and states patient will have neurological evaluation for seizures. Asks if Keppra should be refilled.     Outpatient Medications Prior to Visit  Medication Sig Dispense Refill  . amLODipine (NORVASC) 10 MG tablet Take 1 tablet (10 mg total) by mouth daily. 90 tablet 1  . aspirin 81 MG chewable tablet Chew 1 tablet (81 mg total) by mouth daily at 6 (six) AM. 90 tablet 3  . atorvastatin (LIPITOR) 40 MG tablet Take 1 tablet (40 mg total) by mouth daily. 90 tablet 1  . furosemide (LASIX) 40 MG tablet Take 1 tablet (40 mg total) by mouth daily as needed. 30 tablet 1  . hydrochlorothiazide (HYDRODIURIL) 25 MG tablet Take 1 tablet (25 mg total) by mouth daily. Take on tablet in the morning. 90 tablet 1  . insulin glargine (LANTUS) 100 UNIT/ML injection Inject 0.1 mLs (10 Units total) into the skin at bedtime. 300 mL 0  . levETIRAcetam (KEPPRA) 500 MG tablet Take 1 tablet (500 mg total) by mouth 2 (two) times  daily. 60 tablet 1  . metFORMIN (GLUCOPHAGE-XR) 500 MG 24 hr tablet Take 1 tablet (500 mg total) by mouth daily with breakfast. 90 tablet 1  . Needles & Syringes MISC 10 Units by Does not apply route 4 (four) times daily. 10 units of Lantus at bedtime 240 each 0  . albuterol (PROVENTIL HFA;VENTOLIN HFA) 108 (90 Base) MCG/ACT inhaler Inhale 2 puffs into the lungs every 4 (four) hours as needed for wheezing or shortness of breath. 1 Inhaler 0  . benzonatate (TESSALON) 200 MG capsule Take 1 capsule (200 mg total) by mouth 2 (two) times daily as needed for cough. (Patient not taking: Reported on 02/20/2018) 20 capsule 0  . chlorpheniramine-HYDROcodone (TUSSIONEX PENNKINETIC ER) 10-8 MG/5ML SUER Take 5 mLs by mouth 2 (two) times daily. (Patient not taking: Reported on 02/20/2018) 115 mL 0  . levofloxacin (LEVAQUIN) 500 MG tablet Take 1 tablet (500 mg total) by mouth daily. (Patient not taking: Reported on 02/20/2018) 7 tablet 0   No facility-administered medications prior to visit.      ROS Review of Systems  Constitutional: Negative for chills, fever and malaise/fatigue.  Eyes: Negative for blurred vision.  Respiratory: Negative for shortness of breath.   Cardiovascular: Negative for chest pain and palpitations.  Gastrointestinal: Negative for abdominal pain and nausea.  Genitourinary: Negative for dysuria and hematuria.  Musculoskeletal: Negative for joint pain and myalgias.  Skin: Negative for rash.  Neurological: Negative for tingling and headaches.  Psychiatric/Behavioral: Negative  for depression. The patient is not nervous/anxious.     Objective:  BP 133/86 (BP Location: Right Arm, Patient Position: Sitting, Cuff Size: Large)   Pulse 85   Temp 97.9 F (36.6 C) (Oral)   Ht 5\' 9"  (1.753 m)   Wt 278 lb 6.4 oz (126.3 kg)   SpO2 90%   BMI 41.11 kg/m   BP/Weight 03/04/2018 02/22/2018 16/08/3708  Systolic BP 626 948 -  Diastolic BP 86 94 -  Wt. (Lbs) 278.4 - 283.4  BMI 41.11 - 41.85       Physical Exam Vitals signs reviewed.  Constitutional:      Comments: Well developed, obese, NAD, polite  HENT:     Head: Normocephalic and atraumatic.  Eyes:     General: No scleral icterus. Neck:     Musculoskeletal: Normal range of motion and neck supple.     Thyroid: No thyromegaly.  Cardiovascular:     Rate and Rhythm: Normal rate and regular rhythm.     Heart sounds: Normal heart sounds.  Pulmonary:     Effort: Pulmonary effort is normal.     Breath sounds: Normal breath sounds.  Skin:    General: Skin is warm and dry.     Coloration: Skin is not pale.     Findings: No erythema or rash.  Neurological:     Mental Status: He is alert and oriented to person, place, and time.     Cranial Nerves: No cranial nerve deficit.     Motor: No weakness.     Coordination: Coordination normal.     Gait: Gait normal.  Psychiatric:        Behavior: Behavior normal.        Thought Content: Thought content normal.      Assessment & Plan:    1. Type 2 diabetes mellitus without complication, without long-term current use of insulin (HCC) - Begin glimepiride (AMARYL) 2 MG tablet; Take 1 tablet (2 mg total) by mouth daily before breakfast.  Dispense: 30 tablet; Refill: 3 - Increase metFORMIN (GLUCOPHAGE XR) 500 MG 24 hr tablet; Take 2 tablets (1,000 mg total) by mouth daily with breakfast.  Dispense: 60 tablet; Refill: 5 - Continue Lantus 10 units. - Advised regular exercise and low carb diet.  - Advised to abstain from alcohol and cocaine  2. Seizures (HCC) - Refill levETIRAcetam (KEPPRA) 500 MG tablet; Take 1 tablet (500 mg total) by mouth 2 (two) times daily.  Dispense: 60 tablet; Refill: 0 - Pt to have [facility] consult at [facility]. EEG should be performed to ascertain continued need for Keppra. It is still uncertain at this point if patient had seizures due to substance abuse.     Meds ordered this encounter  Medications  . DISCONTD: metFORMIN (GLUCOPHAGE-XR) 500 MG 24 hr  tablet    Sig: Take 1 tablet (500 mg total) by mouth daily with breakfast.    Dispense:  60 tablet    Refill:  3    Order Specific Question:   Supervising Provider    Answer:   Charlott Rakes [[id number]]  . glimepiride (AMARYL) 2 MG tablet    Sig: Take 1 tablet (2 mg total) by mouth daily before breakfast.    Dispense:  30 tablet    Refill:  3    Order Specific Question:   Supervising Provider    Answer:   Charlott Rakes [[id number]]  . levETIRAcetam (KEPPRA) 500 MG tablet    Sig: Take 1 tablet (500 mg total) by  mouth 2 (two) times daily.    Dispense:  60 tablet    Refill:  0    Order Specific Question:   Supervising Provider    Answer:   Charlott Rakes [[id number]]  . metFORMIN (GLUCOPHAGE XR) 500 MG 24 hr tablet    Sig: Take 2 tablets (1,000 mg total) by mouth daily with breakfast.    Dispense:  60 tablet    Refill:  5    Order Specific Question:   Supervising Provider    Answer:   Charlott Rakes [[id number]]    Follow-up: Return in about 3 months (around 06/03/2018) for Diabetes.   Clent Demark PA

## 2018-03-04 NOTE — Telephone Encounter (Signed)
Clinical Education officer, museum (CSW) received a call from patient's fiance this morning asking if patient can get a PCP in [place]. CSW explained that per AVS (discharge paper work) patient has an appointment today at [place]. Per fiance they are on the way to the appointment now. CSW also gave fiance phone number to medication management. Patient has a PCP apportionment today that he is going to per his fiance.   [employer], LCSW [phone]

## 2018-03-09 ENCOUNTER — Inpatient Hospital Stay (INDEPENDENT_AMBULATORY_CARE_PROVIDER_SITE_OTHER): Payer: Self-pay | Admitting: Physician Assistant

## 2018-03-15 ENCOUNTER — Ambulatory Visit (HOSPITAL_BASED_OUTPATIENT_CLINIC_OR_DEPARTMENT_OTHER): Payer: Self-pay | Attending: Physician Assistant | Admitting: Internal Medicine

## 2018-03-15 ENCOUNTER — Encounter

## 2018-03-15 VITALS — Ht 69.0 in | Wt 275.0 lb

## 2018-03-15 DIAGNOSIS — R51 Headache: Secondary | ICD-10-CM

## 2018-03-15 DIAGNOSIS — R0683 Snoring: Secondary | ICD-10-CM

## 2018-03-15 DIAGNOSIS — G4733 Obstructive sleep apnea (adult) (pediatric): Secondary | ICD-10-CM | POA: Insufficient documentation

## 2018-03-15 DIAGNOSIS — R519 Headache, unspecified: Secondary | ICD-10-CM

## 2018-03-15 DIAGNOSIS — Z8669 Personal history of other diseases of the nervous system and sense organs: Secondary | ICD-10-CM

## 2018-03-15 DIAGNOSIS — R400 Somnolence: Secondary | ICD-10-CM

## 2018-03-17 ENCOUNTER — Telehealth (INDEPENDENT_AMBULATORY_CARE_PROVIDER_SITE_OTHER): Payer: Self-pay | Admitting: Physician Assistant

## 2018-03-17 NOTE — Telephone Encounter (Signed)
Patient called in regard of his sleep study. States that PA [place] advice him that as soon as he did his sleep study he would be able to prescribe a CPAP Machine. Patient would like to know if PCP can write the RX or what step need to be taken in order for him to have this machine.  Please follow up [phone]  Patient uses [facility]ication [facility]mt. [place], [address]  Thank you Emmit Pomfret

## 2018-03-18 NOTE — Telephone Encounter (Signed)
Patient wife is aware that we are waiting for the results from sleep study. Will contact when results are available. Nat Christen, CMA

## 2018-03-28 DIAGNOSIS — Z8669 Personal history of other diseases of the nervous system and sense organs: Secondary | ICD-10-CM

## 2018-03-28 NOTE — Procedures (Signed)
Patient Name: Dillon Wang, Dillon Wang Date: 03/15/2018 Gender: Male D.O.B: 1965/09/09 Age (years): 52 Referring Provider: Clent Demark PA Height (inches): 59 Interpreting Physician: Baird Lyons MD, ABSM Weight (lbs): 275 RPSGT: Laren Everts BMI: 41 MRN: [id number] Neck Size: 18.50  CLINICAL INFORMATION Sleep Study Type: Split Night CPAP Indication for sleep study: Diabetes, Excessive Daytime Sleepiness, Fatigue, Hypertension, Morning Headaches, Obesity, OSA, Snoring, Witnessed Apneas Epworth Sleepiness Score: 24  SLEEP STUDY TECHNIQUE As per the AASM Manual for the Scoring of Sleep and Associated Events v2.3 (April 2016) with a hypopnea requiring 4% desaturations.  The channels recorded and monitored were frontal, central and occipital EEG, electrooculogram (EOG), submentalis EMG (chin), nasal and oral airflow, thoracic and abdominal wall motion, anterior tibialis EMG, snore microphone, electrocardiogram, and pulse oximetry. Continuous positive airway pressure (CPAP) was initiated when the patient met split night criteria and was titrated according to treat sleep-disordered breathing.  MEDICATIONS Medications self-administered by patient taken the night of the study : LANTUS, LEVETIRACETAM  RESPIRATORY PARAMETERS Diagnostic  Total AHI (/hr): 91.7 RDI (/hr): 96.7 OA Index (/hr): 21.9 CA Index (/hr): 2.2 REM AHI (/hr): 73.7 NREM AHI (/hr): 98.1 Supine AHI (/hr): 107.1 Non-supine AHI (/hr): 81.1 Min O2 Sat (%): 76.00 Mean O2 (%): 91.35 Time below 88% (min): 31.8   Titration  Optimal Pressure (cm): 21 AHI at Optimal Pressure (/hr): 0.0 Min O2 at Optimal Pressure (%): 94.0 Supine % at Optimal (%): 100 Sleep % at Optimal (%): 99   SLEEP ARCHITECTURE The recording time for the entire night was 423.1 minutes.  During a baseline period of 164.6 minutes, the patient slept for 134.1 minutes in REM and nonREM, yielding a sleep efficiency of 81.4%. Sleep onset after lights out  was 12.0 minutes with a REM latency of 16.5 minutes. The patient spent 26.48% of the night in stage N1 sleep, 47.42% in stage N2 sleep, 0.00% in stage N3 and 26.1% in REM.  During the titration period of 254.0 minutes, the patient slept for 248.1 minutes in REM and nonREM, yielding a sleep efficiency of 97.7%. Sleep onset after CPAP initiation was 1.4 minutes with a REM latency of 37.5 minutes. The patient spent 5.88% of the night in stage N1 sleep, 59.45% in stage N2 sleep, 0.00% in stage N3 and 34.7% in REM.  CARDIAC DATA The 2 lead EKG demonstrated sinus rhythm. The mean heart rate was 72.79 beats per minute. Other EKG findings include: None.  LEG MOVEMENT DATA The total Periodic Limb Movements of Sleep (PLMS) were 0. The PLMS index was 0.00 .  IMPRESSIONS - Severe obstructive sleep apnea occurred during the diagnostic portion of the study (AHI = 91.7/hour). An optimal PAP pressure was selected for this patient ( 21 cm of water) - No significant central sleep apnea occurred during the diagnostic portion of the study (CAI = 2.2/hour). - Mild oxygen desaturation was noted during the diagnostic portion of the study (Min O2 = 76.00%). - Minimum O2 sat at CPAP 21 was 94%. - The patient snored with loud snoring volume during the diagnostic portion of the study. - No cardiac abnormalities were noted during this study. - Clinically significant periodic limb movements did not occur during sleep.  DIAGNOSIS - Obstructive Sleep Apnea (327.23 [G47.33 ICD-10])  RECOMMENDATIONS - Trial of autopap 15-20 cm H2O. A pressure of 21 cwp may require BIPAP or auto BIPAP.  - Patient used a Medium size Fisher&Paykel Full Face Mask Simplus mask and heated humidification. - Be careful with alcohol, sedatives  and other CNS depressants that may worsen sleep apnea and disrupt normal sleep architecture. - Sleep hygiene should be reviewed to assess factors that may improve sleep quality. - Weight management and  regular exercise should be initiated or continued.  [Electronically signed] 03/28/2018 03:19 PM  Baird Lyons MD, [place], American Board of Sleep Medicine   NPI: [id number]                          [place] [place] [facility]  ELECTRONICALLY SIGNED ON:  03/28/2018, 3:07 PM [place] PH: [phone]   FX: [phone] [place]

## 2018-03-29 ENCOUNTER — Other Ambulatory Visit: Payer: Self-pay | Admitting: Nurse Practitioner

## 2018-03-29 MED ORDER — MISC. DEVICES MISC: 0 refills | Status: DC

## 2018-03-31 ENCOUNTER — Other Ambulatory Visit: Payer: Self-pay | Admitting: Nurse Practitioner

## 2018-03-31 ENCOUNTER — Telehealth (INDEPENDENT_AMBULATORY_CARE_PROVIDER_SITE_OTHER): Payer: Self-pay

## 2018-03-31 ENCOUNTER — Other Ambulatory Visit (INDEPENDENT_AMBULATORY_CARE_PROVIDER_SITE_OTHER): Payer: Self-pay

## 2018-03-31 MED ORDER — MISC. DEVICES MISC: 0 refills | Status: DC

## 2018-03-31 NOTE — Telephone Encounter (Signed)
Patient is aware that sleep study showed sleep apnea. Patient will come into the clinic to sign CPAP assoc documents. Nat Christen, CMA

## 2018-03-31 NOTE — Telephone Encounter (Signed)
-----   Message from Gildardo Pounds, NP sent at 03/29/2018  9:12 PM EST ----- Your sleep study showed sleep apnea. A prescription has been placed in your chart for you to pick up and take to [facility]. If you do not have insurance you will need to contact our office and let us know you will need to apply for our CPAP machine program.

## 2018-04-02 ENCOUNTER — Telehealth: Payer: Self-pay

## 2018-04-02 NOTE — Telephone Encounter (Signed)
Application for CPAP faxed to [employer] - CPAP assistance program.

## 2018-04-05 ENCOUNTER — Ambulatory Visit: Payer: Self-pay

## 2018-04-05 ENCOUNTER — Ambulatory Visit: Payer: Self-pay | Admitting: Pharmacy Technician

## 2018-04-05 ENCOUNTER — Other Ambulatory Visit: Payer: Self-pay

## 2018-04-05 ENCOUNTER — Encounter (INDEPENDENT_AMBULATORY_CARE_PROVIDER_SITE_OTHER): Payer: Self-pay

## 2018-04-05 ENCOUNTER — Encounter: Payer: Self-pay | Admitting: Pharmacist

## 2018-04-05 VITALS — BP 150/90 | Wt 277.0 lb

## 2018-04-05 DIAGNOSIS — Z79899 Other long term (current) drug therapy: Secondary | ICD-10-CM

## 2018-04-05 NOTE — Progress Notes (Addendum)
Completed [facility] application and contract.  Patient agreed to all terms of the [facility] contract.    Patient doesn't live in [place].  [facility] will provide medication assistance, because patient is seeing a provider at [facility] in [place].  Patient stated that he was not able to be seen at [place] and could not afford his medications.  Patient approved to receive medication assistance at [facility] as long as eligibility criteria continues to be met.    Provided patient with Civil engineer, contracting based on his particular needs.    [place] [employer] Ventolin Prescription Applications completed with patient.  Forwarded to Dr. Altamease Oiler, [facility] in [place] for signature.  Upon receipt of signed applications from provider, Lantus Prescription Application will be submitted to [employer] and Ventolin Prescription Application will be submitted to [place].  [facility]

## 2018-04-05 NOTE — Progress Notes (Signed)
Medication Management Clinic Visit Note  Patient: Dillon Wang MRN: [id number] Date of Birth: 26-Feb-1966 PCP: Clent Demark, PA-C   Dillon Wang 53 y.o. male presents for a medication management visit today.  BP (!) 150/90 (BP Location: Right Arm, Patient Position: Sitting, Cuff Size: Large)   Wt 277 lb (125.6 kg)   BMI 40.91 kg/m   Patient Information   Past Medical History:  Diagnosis Date  . Cancer [facility])    prostate  . Diabetes mellitus without complication ([place])   . Hypertension   . Obstructive sleep apnea 09/20/2017  . Sleep apnea      History reviewed. No pertinent surgical history.   Family History  Problem Relation Age of Onset  . Hypertension Mother   . Diabetes Maternal Aunt     Family Support: Good  Lifestyle Diet: Breakfast: eggs, grits, cereal Lunch: sandwich Dinner: hamburger, fries Drinks: water    Current Exercise Habits: The patient does not participate in regular exercise at present       Social History   Substance and Sexual Activity  Alcohol Use Yes   Comment: daily      Social History   Tobacco Use  Smoking Status Current Every Day Smoker  . Types: Cigarettes  Smokeless Tobacco Never Used  Tobacco Comment   2 cigs/day      Health Maintenance  Topic Date Due  . URINE MICROALBUMIN  09/22/1976  . TETANUS/TDAP  09/22/1985  . COLONOSCOPY  09/22/2016  . INFLUENZA VACCINE  10/01/2017  . HIV Screening  Completed   Outpatient Encounter Medications as of 04/05/2018  Medication Sig  . albuterol (PROVENTIL HFA;VENTOLIN HFA) 108 (90 Base) MCG/ACT inhaler Inhale 2 puffs into the lungs every 4 (four) hours as needed for wheezing or shortness of breath.  Marland Kitchen amLODipine (NORVASC) 10 MG tablet Take 1 tablet (10 mg total) by mouth daily.  Marland Kitchen aspirin 81 MG chewable tablet Chew 1 tablet (81 mg total) by mouth daily at 6 (six) AM.  . atorvastatin (LIPITOR) 40 MG tablet Take 1 tablet (40 mg total) by mouth daily.  . furosemide (LASIX) 40  MG tablet Take 1 tablet (40 mg total) by mouth daily as needed.  Marland Kitchen glimepiride (AMARYL) 2 MG tablet Take 1 tablet (2 mg total) by mouth daily before breakfast.  . hydrochlorothiazide (HYDRODIURIL) 25 MG tablet Take 1 tablet (25 mg total) by mouth daily. Take on tablet in the morning.  . insulin glargine (LANTUS) 100 UNIT/ML injection Inject 0.1 mLs (10 Units total) into the skin at bedtime.  . levETIRAcetam (KEPPRA) 500 MG tablet Take 1 tablet (500 mg total) by mouth 2 (two) times daily.  . metFORMIN (GLUCOPHAGE XR) 500 MG 24 hr tablet Take 2 tablets (1,000 mg total) by mouth daily with breakfast.  . Misc. Devices MISC Auto bipap with Medium size Fisher&Paykel Full Face Mask Simplus mask/ heated humidification. Autopap settings 15-20 cm H2O.  . Needles & Syringes MISC 10 Units by Does not apply route 4 (four) times daily. 10 units of Lantus at bedtime  . [DISCONTINUED] albuterol (PROVENTIL HFA;VENTOLIN HFA) 108 (90 Base) MCG/ACT inhaler Inhale 2 puffs into the lungs every 4 (four) hours as needed for wheezing or shortness of breath.   No facility-administered encounter medications on file as of 04/05/2018.    Health Maintenance/Date Completed  Last ED visit: November 2019 Last Visit to PCP: recently Next Visit to PCP: 3-4 weeks Specialist Visit: no Dental Exam: no Eye Exam: no Prostate Exam: no Pelvic/PAP Exam: NA  Mammogram: NA DEXA: NA Colonoscopy: no Flu Vaccine: yes Pneumonia Vaccine: yes   Assessment and Plan: Exam is limited by the patient falling asleep very easily and snoring loudly between questions. Most of his answers are limited to "uh-huh."  Diabetes: Lantus, metformin, glimepiride. No issues since recently starting medications. Checks sugars at home. Has been running 225 through the weekend. Before starting medications, sugars were in the 400s most of the time. Says that "it's down." Ranging 190 to 225. Last A1C 9.02 February 2018 while hospitalized.  HTN: amlodipine,  HCTZ, furosemide. Currently out of medication for 2 days because they need to be transferred from [facility]. BP today elevated at 150/90, which may be due to him not taking medications for a few days. Reports that his BP is usually normal at office.  Seizures: levetiracetam. Has not had any seizures recently.  Sleep apnea: filled out paperwork at PCP office to get CPAP. Patient easily fell asleep with loud snoring throughout visit.  Compliance: does not miss any doses as long he has his medications and does not run out of refills.  Tawnya Crook, PharmD Pharmacy Resident

## 2018-04-06 ENCOUNTER — Telehealth: Payer: Self-pay

## 2018-04-06 NOTE — Telephone Encounter (Signed)
Call placed to [employer] - CPAP assistance program. Spoke to [place] and $652 donation was paid by [facility] for patient's CPAP machine.

## 2018-04-13 ENCOUNTER — Telehealth: Payer: Self-pay

## 2018-04-13 NOTE — Telephone Encounter (Signed)
Call received from the patient and informed him that CPAP teaching has been scheduled for 04/27/2018 @ 1000 @ [place].

## 2018-04-13 NOTE — Telephone Encounter (Signed)
Attempted to contact patient to inform him that his CPAP machine has arrived and teaching with the respiratory therapist has been scheduled for 04/27/2018 @ 1000 @ [place].  Call placed to # [phone] and a message was left requesting a call back to this CM # [phone]. Call placed to # [phone]. The call was answered and then the person hung up.

## 2018-04-19 ENCOUNTER — Encounter (INDEPENDENT_AMBULATORY_CARE_PROVIDER_SITE_OTHER): Payer: Self-pay

## 2018-04-27 ENCOUNTER — Telehealth: Payer: Self-pay

## 2018-04-27 NOTE — Telephone Encounter (Signed)
Patient met with Pamala Hurry and Carol/ Respiratory therapists with [facility] and was instructed regarding the use and care of the CPAP machine.  After successful  completion of the teaching he was given the machine to take home.

## 2018-05-06 ENCOUNTER — Ambulatory Visit (INDEPENDENT_AMBULATORY_CARE_PROVIDER_SITE_OTHER): Payer: Self-pay | Admitting: Primary Care

## 2018-05-10 ENCOUNTER — Telehealth (INDEPENDENT_AMBULATORY_CARE_PROVIDER_SITE_OTHER): Payer: Self-pay | Admitting: Primary Care

## 2018-05-10 NOTE — Telephone Encounter (Signed)
Patient called to request med refill for   insulin glargine (LANTUS) 100 UNIT/ML injection   furosemide (LASIX) 40 MG tablet  States that he has  Contacted his pharmacy to request a refill but was told he needed a new RX in order to have it filled.   Patient uses [facility] [place], [place], [place] [address] Phone: [phone]  Please advice [phone]  Thank you Emmit Pomfret

## 2018-05-13 NOTE — Telephone Encounter (Signed)
FWD to PCP. Tempestt S Roberts, CMA  

## 2018-05-17 ENCOUNTER — Ambulatory Visit: Payer: Self-pay | Admitting: Pharmacist

## 2018-05-17 ENCOUNTER — Other Ambulatory Visit: Payer: Self-pay

## 2018-05-17 ENCOUNTER — Other Ambulatory Visit (INDEPENDENT_AMBULATORY_CARE_PROVIDER_SITE_OTHER): Payer: Self-pay | Admitting: Primary Care

## 2018-05-17 VITALS — BP 150/80

## 2018-05-17 DIAGNOSIS — R600 Localized edema: Secondary | ICD-10-CM

## 2018-05-17 DIAGNOSIS — Z79899 Other long term (current) drug therapy: Secondary | ICD-10-CM

## 2018-05-17 MED ORDER — FUROSEMIDE 40 MG PO TABS: 40.0000 mg | ORAL_TABLET | Freq: Every day | ORAL | 1 refills | Status: DC | PRN

## 2018-05-17 MED ORDER — INSULIN GLARGINE 100 UNIT/ML ~~LOC~~ SOLN: 10.0000 [IU] | Freq: Every day | SUBCUTANEOUS | 0 refills | Status: DC

## 2018-05-17 NOTE — Progress Notes (Signed)
Medication Management Clinic Visit Note  Patient: Dillon Wang MRN: [id number] Date of Birth: 02/12/1966 PCP: Clent Demark, PA-C   Shail Fabio Neighbors 53 y.o. male presents for a follow-up medication management visit today.  BP (!) 150/80 (BP Location: Left Arm, Patient Position: Sitting, Cuff Size: Large)   Patient Information   Past Medical History:  Diagnosis Date  . Cancer [facility])    prostate  . Diabetes mellitus without complication ([place])   . Hypertension   . Obstructive sleep apnea 09/20/2017  . Seizures ([place])   . Sleep apnea      History reviewed. No pertinent surgical history.   Family History  Problem Relation Age of Onset  . Hypertension Mother   . Diabetes Maternal Aunt     New Diagnoses (since last visit): N/A  Family Support: Good  Lifestyle Diet: Breakfast: eggs and grits Lunch: hamburger and fries Dinner: chicken and dumplings Drinks: tea or water Very little snacking  Exercise: walk about a mile daily            Social History   Substance and Sexual Activity  Alcohol Use Not Currently   Comment: sober 6 months      Social History   Tobacco Use  Smoking Status Current Every Day Smoker  . Types: Cigarettes  Smokeless Tobacco Never Used  Tobacco Comment   1 cigs/day      Health Maintenance  Topic Date Due  . URINE MICROALBUMIN  09/22/1976  . TETANUS/TDAP  09/22/1985  . COLONOSCOPY  09/22/2016  . INFLUENZA VACCINE  10/01/2017  . HIV Screening  Completed   Outpatient Encounter Medications as of 05/17/2018  Medication Sig  . albuterol (PROVENTIL HFA;VENTOLIN HFA) 108 (90 Base) MCG/ACT inhaler Inhale 2 puffs into the lungs every 4 (four) hours as needed for wheezing or shortness of breath.  Marland Kitchen amLODipine (NORVASC) 10 MG tablet Take 1 tablet (10 mg total) by mouth daily.  Marland Kitchen aspirin 81 MG chewable tablet Chew 1 tablet (81 mg total) by mouth daily at 6 (six) AM.  . atorvastatin (LIPITOR) 40 MG tablet Take 1 tablet (40 mg total) by  mouth daily.  Marland Kitchen glimepiride (AMARYL) 2 MG tablet Take 1 tablet (2 mg total) by mouth daily before breakfast.  . hydrochlorothiazide (HYDRODIURIL) 25 MG tablet Take 1 tablet (25 mg total) by mouth daily. Take on tablet in the morning.  . levETIRAcetam (KEPPRA) 500 MG tablet Take 1 tablet (500 mg total) by mouth 2 (two) times daily.  . metFORMIN (GLUCOPHAGE XR) 500 MG 24 hr tablet Take 2 tablets (1,000 mg total) by mouth daily with breakfast.  . Misc. Devices MISC Auto bipap with Medium size Fisher[employer]Paykel Full Face Mask Simplus mask/ heated humidification. Autopap settings 15-20 cm H2O.  . Needles & Syringes MISC 10 Units by Does not apply route 4 (four) times daily. 10 units of Lantus at bedtime  . [DISCONTINUED] furosemide (LASIX) 40 MG tablet Take 1 tablet (40 mg total) by mouth daily as needed.  . [DISCONTINUED] insulin glargine (LANTUS) 100 UNIT/ML injection Inject 0.1 mLs (10 Units total) into the skin at bedtime.   No facility-administered encounter medications on file as of 05/17/2018.     Upcoming visits: March 18th PCP visit   Assessment and Plan: Compliance: Reports taking his medications fairly regularly and not missing many doses. He did request a pill box, which I was able to provide him with today.    Diabetes: Medications include glimepiride, metformin XR, and Lantus. Mr. Sheard checks BG at  home and it usually is around 165 fasting and 250 after breakfast. I reminded the patient to check 2 hours after eating rather than immediately after eating so that we can see how his medications respond to the food he is eating. These readings are an improvement for him. Most recent A1c on 02/20/2018 9.3%.  HTN: Medications include amlodipine and HCTZ. He does not check BP at home but inquired about a BP cuff. I told him that we are unable to get him one here, but that many grocery stores with pharmacies or retail pharmacies have machines that anyone can use. BP 150/80 today, which was checked  after the patient had been sitting for roughly 15 minutes. Of note, the patient monitors his weight regularly and uses furosemide if his weight increases.   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 He does report still smoking about 1 cigarette per day immediately after breakfast. He notes that since cutting back from 1 PPD he has had to use his albuterol inhaler much less frequently. Congratulated patient on his progress and encouraged him to keep up the great work!  HLD: Atorvastatin. Most recent labs from 01/2018 show elevated TG and low HDL, but an LDL of 89.  Seizures: Keppra. Patient reports no recent seizure activity.  Other: Per previous notes, patient has difficulty staying awake and would fall asleep during visits. Patient was not overly sedated or tired today and was engaged during the visit. He is still a little guarded and short with answers, but he appears to have improved since previous visits. Patient now has CPAP at home, which he uses nightly for OSA.  RTC: 6 months  Florinda Marker, PharmD [facility]

## 2018-05-19 ENCOUNTER — Ambulatory Visit (INDEPENDENT_AMBULATORY_CARE_PROVIDER_SITE_OTHER): Payer: Self-pay | Admitting: Primary Care

## 2018-05-19 ENCOUNTER — Other Ambulatory Visit: Payer: Self-pay

## 2018-05-19 ENCOUNTER — Encounter (INDEPENDENT_AMBULATORY_CARE_PROVIDER_SITE_OTHER): Payer: Self-pay | Admitting: Primary Care

## 2018-05-19 VITALS — BP 139/89 | HR 106 | Temp 98.4°F | Ht 69.0 in | Wt 279.0 lb

## 2018-05-19 DIAGNOSIS — R0602 Shortness of breath: Secondary | ICD-10-CM

## 2018-05-19 DIAGNOSIS — R569 Unspecified convulsions: Secondary | ICD-10-CM

## 2018-05-19 DIAGNOSIS — Z760 Encounter for issue of repeat prescription: Secondary | ICD-10-CM

## 2018-05-19 DIAGNOSIS — E119 Type 2 diabetes mellitus without complications: Secondary | ICD-10-CM

## 2018-05-19 DIAGNOSIS — Z1211 Encounter for screening for malignant neoplasm of colon: Secondary | ICD-10-CM

## 2018-05-19 DIAGNOSIS — I10 Essential (primary) hypertension: Secondary | ICD-10-CM

## 2018-05-19 DIAGNOSIS — F1410 Cocaine abuse, uncomplicated: Secondary | ICD-10-CM

## 2018-05-19 LAB — GLUCOSE, POCT (MANUAL RESULT ENTRY): POC Glucose: 270 mg/dl — AB (ref 70–99)

## 2018-05-19 LAB — POCT GLYCOSYLATED HEMOGLOBIN (HGB A1C): Hemoglobin A1C: 7.5 % — AB (ref 4.0–5.6)

## 2018-05-19 MED ORDER — METFORMIN HCL ER 500 MG PO TB24: 1000.0000 mg | ORAL_TABLET | Freq: Every day | ORAL | 5 refills | Status: DC

## 2018-05-19 MED ORDER — GLIMEPIRIDE 2 MG PO TABS: 2.0000 mg | ORAL_TABLET | Freq: Every day | ORAL | 3 refills | Status: DC

## 2018-05-19 MED ORDER — HYDROCHLOROTHIAZIDE 25 MG PO TABS: 25.0000 mg | ORAL_TABLET | Freq: Every day | ORAL | 1 refills | Status: DC

## 2018-05-19 MED ORDER — AMLODIPINE BESYLATE 10 MG PO TABS: 10.0000 mg | ORAL_TABLET | Freq: Every day | ORAL | 1 refills | Status: DC

## 2018-05-19 MED ORDER — NEEDLES & SYRINGES MISC: 10.0000 [IU] | Freq: Four times a day (QID) | 0 refills | Status: DC

## 2018-05-19 MED ORDER — LEVETIRACETAM 500 MG PO TABS: 500.0000 mg | ORAL_TABLET | Freq: Two times a day (BID) | ORAL | 0 refills | Status: DC

## 2018-05-19 MED ORDER — ATORVASTATIN CALCIUM 40 MG PO TABS: 40.0000 mg | ORAL_TABLET | Freq: Every day | ORAL | 1 refills | Status: DC

## 2018-05-19 MED ORDER — BUDESONIDE-FORMOTEROL FUMARATE 160-4.5 MCG/ACT IN AERO: 2.0000 | INHALATION_SPRAY | Freq: Two times a day (BID) | RESPIRATORY_TRACT | 3 refills | Status: DC

## 2018-05-19 NOTE — Patient Instructions (Signed)
Shortness of Breath, Adult  Shortness of breath means you have trouble breathing. Shortness of breath could be a sign of a medical problem.  Follow these instructions at home:     Watch for any changes in your symptoms.   Do not use any products that contain nicotine or tobacco, such as cigarettes, e-cigarettes, and chewing tobacco.   Do not smoke. Smoking can cause shortness of breath. If you need help to quit smoking, ask your doctor.   Avoid things that can make it harder to breathe, such as:  ? Mold.  ? Dust.  ? Air pollution.  ? Chemical smells.  ? Things that can cause allergy symptoms (allergens), if you have allergies.   Keep your living space clean. Use products that help remove mold and dust.   Rest as needed. Slowly return to your normal activities.   Take over-the-counter and prescription medicines only as told by your doctor. This includes oxygen therapy and inhaled medicines.   Keep all follow-up visits as told by your doctor. This is important.  Contact a doctor if:   Your condition does not get better as soon as expected.   You have a hard time doing your normal activities, even after you rest.   You have new symptoms.  Get help right away if:   Your shortness of breath gets worse.   You have trouble breathing when you are resting.   You feel light-headed or you pass out (faint).   You have a cough that is not helped by medicines.   You cough up blood.   You have pain with breathing.   You have pain in your chest, arms, shoulders, or belly (abdomen).   You have a fever.   You cannot walk up stairs.   You cannot exercise the way you normally do.  These symptoms may represent a serious problem that is an emergency. Do not wait to see if the symptoms will go away. Get medical help right away. Call your local emergency services (911 in the [place] Do not drive yourself to the hospital.  Summary   Shortness of breath is when you have trouble breathing enough air. It can be a sign of a  medical problem.   Avoid things that make it hard for you to breathe, such as smoking, pollution, mold, and dust.   Watch for any changes in your symptoms. Contact your doctor if you do not get better or you get worse.  This information is not intended to replace advice given to you by your health care provider. Make sure you discuss any questions you have with your health care provider.  Document Released: 08/06/2007 Document Revised: 07/20/2017 Document Reviewed: 07/20/2017  Elsevier Interactive Patient Education  2019 Elsevier Inc[employer]

## 2018-05-19 NOTE — Progress Notes (Signed)
Established Patient Office Visit  Subjective:  Patient ID: Dillon Wang, male    DOB: 11/11/1965  Age: 53 y.o. MRN: [id number]  CC:  Chief Complaint  Patient presents with  . Follow-up    DM    HPI Dillon Wang presents for management of diabetes. However, while in the room patient breathing heavy and questioned did he breath like this all the time and wheezing. Set up respiratory test when clinic is reopened. Patient recently use cocaine in the past month. Expressed the affects on the heart. PMH: of testicular cancer, HTN, PND, OSA, alcohol abuse, cocaine abuse, fatty liver, and hepatomegaly  acute respiratory failure with hypoxia, tachypnea, volume depletion, cocaine abuse, elevated lactic acid level, and DM2 and acute toxic metabolic encephalopathy secondary to alcohol intoxication and  drug abuse.   Past Medical History:  Diagnosis Date  . Cancer [facility])    prostate  . Diabetes mellitus without complication ([place])   . Hyperlipidemia   . Hypertension   . Obstructive sleep apnea 09/20/2017  . Seizures ([place])   . Sleep apnea     No past surgical history on file.  Family History  Problem Relation Age of Onset  . Hypertension Mother   . Diabetes Maternal Aunt     Social History   Socioeconomic History  . Marital status: Single    Spouse name: Not on file  . Number of children: Not on file  . Years of education: Not on file  . Highest education level: Not on file  Occupational History  . Not on file  Social Needs  . Financial resource strain: Not on file  . Food insecurity:    Worry: Not on file    Inability: Not on file  . Transportation needs:    Medical: Not on file    Non-medical: Not on file  Tobacco Use  . Smoking status: Current Every Day Smoker    Types: Cigarettes  . Smokeless tobacco: Never Used  . Tobacco comment: 1 cigs/day  Substance and Sexual Activity  . Alcohol use: Not Currently    Comment: sober 6 months  . Drug use: Not Currently    Types:  Cocaine    Comment: sober 6 months  . Sexual activity: Not on file  Lifestyle  . Physical activity:    Days per week: Not on file    Minutes per session: Not on file  . Stress: Not on file  Relationships  . Social connections:    Talks on phone: Not on file    Gets together: Not on file    Attends religious service: Not on file    Active member of club or organization: Not on file    Attends meetings of clubs or organizations: Not on file    Relationship status: Not on file  . Intimate partner violence:    Fear of current or ex partner: Not on file    Emotionally abused: Not on file    Physically abused: Not on file    Forced sexual activity: Not on file  Other Topics Concern  . Not on file  Social History Narrative  . Not on file    Outpatient Medications Prior to Visit  Medication Sig Dispense Refill  . albuterol (PROVENTIL HFA;VENTOLIN HFA) 108 (90 Base) MCG/ACT inhaler Inhale 2 puffs into the lungs every 4 (four) hours as needed for wheezing or shortness of breath.    Marland Kitchen aspirin 81 MG chewable tablet Chew 1 tablet (81 mg total) by  mouth daily at 6 (six) AM. 90 tablet 3  . furosemide (LASIX) 40 MG tablet Take 1 tablet (40 mg total) by mouth daily as needed. 30 tablet 1  . insulin glargine (LANTUS) 100 UNIT/ML injection Inject 0.1 mLs (10 Units total) into the skin at bedtime. 300 mL 0  . Misc. Devices MISC Auto bipap with Medium size Fisher[employer] Full Face Mask Simplus mask/ heated humidification. Autopap settings 15-20 cm H2O. 1 each 0  . amLODipine (NORVASC) 10 MG tablet Take 1 tablet (10 mg total) by mouth daily. 90 tablet 1  . atorvastatin (LIPITOR) 40 MG tablet Take 1 tablet (40 mg total) by mouth daily. 90 tablet 1  . glimepiride (AMARYL) 2 MG tablet Take 1 tablet (2 mg total) by mouth daily before breakfast. 30 tablet 3  . hydrochlorothiazide (HYDRODIURIL) 25 MG tablet Take 1 tablet (25 mg total) by mouth daily. Take on tablet in the morning. 90 tablet 1  .  levETIRAcetam (KEPPRA) 500 MG tablet Take 1 tablet (500 mg total) by mouth 2 (two) times daily. 60 tablet 0  . metFORMIN (GLUCOPHAGE XR) 500 MG 24 hr tablet Take 2 tablets (1,000 mg total) by mouth daily with breakfast. 60 tablet 5  . Needles & Syringes MISC 10 Units by Does not apply route 4 (four) times daily. 10 units of Lantus at bedtime 240 each 0   No facility-administered medications prior to visit.     Allergies  Allergen Reactions  . Penicillins     ROS Review of Systems  Constitutional: Negative.   HENT: Positive for congestion.   Eyes: Negative.   Respiratory: Positive for cough.   Cardiovascular: Negative.   Gastrointestinal: Negative.   Endocrine: Negative.   Genitourinary: Negative.   Musculoskeletal: Negative.   Skin: Negative.   Allergic/Immunologic: Negative.   Neurological: Negative.   Psychiatric/Behavioral: Negative.       Objective:    Physical Exam  Constitutional: He is oriented to person, place, and time. He appears well-developed and well-nourished.  HENT:  Head: Normocephalic and atraumatic.  Eyes: Pupils are equal, round, and reactive to light. EOM are normal.  Neck: Normal range of motion. Neck supple.  Cardiovascular: Normal rate and regular rhythm.  Pulmonary/Chest: He has wheezes.  Abdominal: Soft. Bowel sounds are normal. He exhibits distension.  Musculoskeletal: Normal range of motion.  Neurological: He is alert and oriented to person, place, and time.  Skin: Skin is warm and dry.  Psychiatric: He has a normal mood and affect.    BP 139/89 (BP Location: Right Arm, Patient Position: Sitting, Cuff Size: Large)   Pulse (!) 106   Temp 98.4 F (36.9 C) (Oral)   Ht 5\' 9"  (1.753 m)   Wt 279 lb (126.6 kg)   SpO2 93%   BMI 41.20 kg/m  Wt Readings from Last 3 Encounters:  05/19/18 279 lb (126.6 kg)  04/05/18 277 lb (125.6 kg)  03/15/18 275 lb (124.7 kg)     Health Maintenance Due  Topic Date Due  . URINE MICROALBUMIN  09/22/1976   . TETANUS/TDAP  09/22/1985  . COLONOSCOPY  09/22/2016  . INFLUENZA VACCINE  10/01/2017    There are no preventive care reminders to display for this patient.  Lab Results  Component Value Date   TSH 1.847 02/20/2018   Lab Results  Component Value Date   WBC 10.2 02/20/2018   HGB 15.0 02/20/2018   HCT 41.1 02/20/2018   MCV 83.9 02/20/2018   PLT 178 02/20/2018   Lab  Results  Component Value Date   NA 135 02/22/2018   K 3.3 (L) 02/22/2018   CO2 24 02/22/2018   GLUCOSE 175 (H) 02/22/2018   BUN 12 02/22/2018   CREATININE 0.99 02/22/2018   BILITOT 1.4 (H) 02/20/2018   ALKPHOS 116 02/20/2018   AST 36 02/20/2018   ALT 37 02/20/2018   PROT 7.8 02/20/2018   ALBUMIN 4.3 02/20/2018   CALCIUM 8.5 (L) 02/22/2018   ANIONGAP 8 02/22/2018   Lab Results  Component Value Date   CHOL 155 01/12/2018   Lab Results  Component Value Date   HDL 33 (L) 01/12/2018   Lab Results  Component Value Date   LDLCALC 89 01/12/2018   Lab Results  Component Value Date   TRIG 164 (H) 01/12/2018   Lab Results  Component Value Date   CHOLHDL 4.7 01/12/2018   Lab Results  Component Value Date   HGBA1C 7.5 (A) 05/19/2018      Assessment & Plan:   Problem List Items Addressed This Visit    Hypertension   Relevant Medications   amLODipine (NORVASC) 10 MG tablet   atorvastatin (LIPITOR) 40 MG tablet   hydrochlorothiazide (HYDRODIURIL) 25 MG tablet   Cocaine abuse ([place])    Other Visit Diagnoses    Type 2 diabetes mellitus without complication, without long-term current use of insulin (HCC)    -  Primary   Relevant Medications   atorvastatin (LIPITOR) 40 MG tablet   glimepiride (AMARYL) 2 MG tablet   metFORMIN (GLUCOPHAGE XR) 500 MG 24 hr tablet   Other Relevant Orders   HgB A1c (Completed)   Glucose (CBG) (Completed)   Microalbumin, urine   Ambulatory referral to Ophthalmology   Medication refill       Relevant Medications   atorvastatin (LIPITOR) 40 MG tablet   glimepiride  (AMARYL) 2 MG tablet   Other Relevant Orders   Levetiracetam level   Seizures (HCC)       Relevant Medications   levETIRAcetam (KEPPRA) 500 MG tablet   Other Relevant Orders   HgB A1c (Completed)   Ambulatory referral to Neurology   Shortness of breath       Relevant Orders   Pulmonary Function Test [facility] Only   Colon cancer screening       Relevant Orders   Ambulatory referral to Gastroenterology    Maika was seen today for follow-up.  Diagnoses and all orders for this visit:  Type 2 diabetes mellitus without complication, without long-term current use of insulin (HCC) -     HgB A1c 7.2 -     Glucose (CBG) -     Microalbumin, urine -     metFORMIN (GLUCOPHAGE XR) 500 MG 24 hr tablet; Take 2 tablets (1,000 mg total) by mouth daily with breakfast. -     Ambulatory referral to Ophthalmology  Hypertension, unspecified type -     amLODipine (NORVASC) 10 MG tablet; Take 1 tablet (10 mg total) by mouth daily. -     atorvastatin (LIPITOR) 40 MG tablet; Take 1 tablet (40 mg total) by mouth daily. -     hydrochlorothiazide (HYDRODIURIL) 25 MG tablet; Take 1 tablet (25 mg total) by mouth daily. Take on tablet in the morning.  Medication refill -     atorvastatin (LIPITOR) 40 MG tablet; Take 1 tablet (40 mg total) by mouth daily. -     glimepiride (AMARYL) 2 MG tablet; Take 1 tablet (2 mg total) by mouth daily before breakfast. -  Levetiracetam level  Seizures (HCC) -     HgB A1c -     levETIRAcetam (KEPPRA) 500 MG tablet; Take 1 tablet (500 mg total) by mouth 2 (two) times daily. -     Ambulatory referral to Neurology  Shortness of breath -     Pulmonary Function Test [facility] Only; Future  Colon cancer screening -     Ambulatory referral to Gastroenterology  Cocaine abuse [facility]) Discussed the risk to his heart cont using this drug. Admits to last used 30 days ago   Other orders -     Cancel: Tdap vaccine greater than or equal to 7yo IM -     budesonide-formoterol (SYMBICORT)  160-4.5 MCG/ACT inhaler; Inhale 2 puffs into the lungs 2 (two) times daily. -     Needles & Syringes MISC; 10 Units by Does not apply route 4 (four) times daily. 10 units of Lantus at bedtime    Meds ordered this encounter  Medications  . budesonide-formoterol (SYMBICORT) 160-4.5 MCG/ACT inhaler    Sig: Inhale 2 puffs into the lungs 2 (two) times daily.    Dispense:  1 Inhaler    Refill:  3  . amLODipine (NORVASC) 10 MG tablet    Sig: Take 1 tablet (10 mg total) by mouth daily.    Dispense:  90 tablet    Refill:  1  . atorvastatin (LIPITOR) 40 MG tablet    Sig: Take 1 tablet (40 mg total) by mouth daily.    Dispense:  90 tablet    Refill:  1  . glimepiride (AMARYL) 2 MG tablet    Sig: Take 1 tablet (2 mg total) by mouth daily before breakfast.    Dispense:  30 tablet    Refill:  3  . hydrochlorothiazide (HYDRODIURIL) 25 MG tablet    Sig: Take 1 tablet (25 mg total) by mouth daily. Take on tablet in the morning.    Dispense:  90 tablet    Refill:  1  . levETIRAcetam (KEPPRA) 500 MG tablet    Sig: Take 1 tablet (500 mg total) by mouth 2 (two) times daily.    Dispense:  60 tablet    Refill:  0  . metFORMIN (GLUCOPHAGE XR) 500 MG 24 hr tablet    Sig: Take 2 tablets (1,000 mg total) by mouth daily with breakfast.    Dispense:  60 tablet    Refill:  5  . Needles & Syringes MISC    Sig: 10 Units by Does not apply route 4 (four) times daily. 10 units of Lantus at bedtime    Dispense:  240 each    Refill:  0    Follow-up: Return in about 3 months (around 08/19/2018) for diabetes, HTN, .    Kerin Perna, NP

## 2018-05-20 LAB — MICROALBUMIN, URINE: Microalbumin, Urine: <3 ug/mL

## 2018-05-21 ENCOUNTER — Telehealth (INDEPENDENT_AMBULATORY_CARE_PROVIDER_SITE_OTHER): Payer: Self-pay

## 2018-05-21 NOTE — Telephone Encounter (Signed)
Patient and wife aware that microalbumin was normal. Informed them both that  Results indicate he is not or had not been taking his seizure medication. Patient stated he had been out but was really unsure because he thought he had been taking it. Advised patient to pick up refill of seizure medication and take it as directed. Nat Christen, CMA

## 2018-05-21 NOTE — Telephone Encounter (Signed)
-----   Message from Kerin Perna, NP sent at 05/21/2018 12:04 PM EDT ----- These results indicate not taking seizure medication. Why?

## 2018-05-22 LAB — LEVETIRACETAM LEVEL: Levetiracetam Lvl: <1 ug/mL — ABNORMAL LOW (ref 10.0–40.0)

## 2018-06-01 ENCOUNTER — Telehealth: Payer: Self-pay | Admitting: Primary Care

## 2018-06-01 NOTE — Telephone Encounter (Signed)
Patient FYI 

## 2018-06-01 NOTE — Telephone Encounter (Signed)
You can increase his Keppra to 750mg  twice daily

## 2018-06-01 NOTE — Telephone Encounter (Signed)
Pt called in wanted to inform his dr that he had a seizure in. Pt also stated tht he was referred to nerologist  and they wanted a $200 co-pay pt states he does not have the funds to pay for it please follow up

## 2018-06-02 ENCOUNTER — Other Ambulatory Visit: Payer: Self-pay | Admitting: Primary Care

## 2018-06-02 DIAGNOSIS — R569 Unspecified convulsions: Secondary | ICD-10-CM

## 2018-06-02 MED ORDER — LEVETIRACETAM 750 MG PO TABS: 750.0000 mg | ORAL_TABLET | Freq: Two times a day (BID) | ORAL | 1 refills | Status: DC

## 2018-06-02 MED ORDER — LEVETIRACETAM 750 MG PO TABS: 500.0000 mg | ORAL_TABLET | Freq: Two times a day (BID) | ORAL | 3 refills | Status: DC

## 2018-06-02 NOTE — Telephone Encounter (Signed)
Patient called back and was informed on medication change and aware new Rx was sent over.   Pt. Understood and aware.

## 2018-07-05 DIAGNOSIS — Z09 Encounter for follow-up examination after completed treatment for conditions other than malignant neoplasm: Secondary | ICD-10-CM

## 2018-07-29 ENCOUNTER — Other Ambulatory Visit (INDEPENDENT_AMBULATORY_CARE_PROVIDER_SITE_OTHER): Payer: Self-pay | Admitting: Primary Care

## 2018-07-29 DIAGNOSIS — R600 Localized edema: Secondary | ICD-10-CM

## 2018-08-03 ENCOUNTER — Telehealth: Payer: Self-pay | Admitting: Neurology

## 2018-08-03 NOTE — Telephone Encounter (Signed)
08-03-18 Pt wife has called and gave verbal consent to file insurance for [email or web address] vv phone# to text is [phone] [employer]  Pt understands that although there may be some limitations with this type of visit, we will take all precautions to reduce any security or privacy concerns.  Pt understands that this will be treated like an in office visit and we will file with pt's insurance, and there may be a patient responsible charge related to this service. *text message being sent*

## 2018-08-04 ENCOUNTER — Encounter: Payer: Self-pay | Admitting: Neurology

## 2018-08-04 NOTE — Telephone Encounter (Signed)
I contacted the pt and pt's wife and completed the pre charting for 08/05/18 virtual visit. Pt's wife confirmed he received link. Pt is being evaluated for seizures.

## 2018-08-05 ENCOUNTER — Other Ambulatory Visit: Payer: Self-pay

## 2018-08-05 ENCOUNTER — Encounter: Payer: Self-pay | Admitting: Neurology

## 2018-08-05 ENCOUNTER — Ambulatory Visit (INDEPENDENT_AMBULATORY_CARE_PROVIDER_SITE_OTHER): Payer: Self-pay | Admitting: Neurology

## 2018-08-05 DIAGNOSIS — R05 Cough: Secondary | ICD-10-CM

## 2018-08-05 DIAGNOSIS — G40909 Epilepsy, unspecified, not intractable, without status epilepticus: Secondary | ICD-10-CM

## 2018-08-05 DIAGNOSIS — R054 Cough syncope: Secondary | ICD-10-CM

## 2018-08-05 DIAGNOSIS — R55 Syncope and collapse: Secondary | ICD-10-CM | POA: Insufficient documentation

## 2018-08-05 HISTORY — DX: Syncope and collapse: R55

## 2018-08-05 HISTORY — DX: Cough syncope: R05.4
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
## 2018-08-13 ENCOUNTER — Telehealth: Payer: Self-pay | Admitting: Pharmacist

## 2018-08-13 NOTE — Telephone Encounter (Signed)
08/13/2018 10:08:27 AM - Lantus Solostar refill to [facility]  08/13/2018 Faxed [employer] refill request for Lantus Solostar Inject 10 units daily at bedtime #1.Delos Haring

## 2018-08-31 ENCOUNTER — Ambulatory Visit (AMBULATORY_SURGERY_CENTER): Payer: Self-pay

## 2018-08-31 ENCOUNTER — Other Ambulatory Visit: Payer: Self-pay

## 2018-08-31 VITALS — Ht 69.0 in | Wt 240.0 lb

## 2018-08-31 DIAGNOSIS — Z1211 Encounter for screening for malignant neoplasm of colon: Secondary | ICD-10-CM

## 2018-08-31 MED ORDER — NA SULFATE-K SULFATE-MG SULF 17.5-3.13-1.6 GM/177ML PO SOLN: 1.0000 | Freq: Once | ORAL | 0 refills | Status: AC

## 2018-08-31 NOTE — Progress Notes (Signed)
Denies allergies to eggs or soy products. Denies complication of anesthesia or sedation. Denies use of weight loss medication. Denies use of O2.   Emmi instructions given for colonoscopy.  Pre-Visit was conducted by phone due to covid 19. Instructions were reviewed and mailed to patients confirmed address. Patient was encouraged to call if he had questions regarding the instructions.

## 2018-09-01 ENCOUNTER — Other Ambulatory Visit: Payer: Self-pay

## 2018-09-06 ENCOUNTER — Telehealth: Payer: Self-pay | Admitting: Neurology

## 2018-09-06 NOTE — Telephone Encounter (Signed)
Called patient and LVM to schedule EEG requested by Dr. Jannifer Franklin. Requested patient call back. Office contact info provided.

## 2018-09-14 ENCOUNTER — Telehealth: Payer: Self-pay | Admitting: Gastroenterology

## 2018-09-14 NOTE — Telephone Encounter (Signed)

## 2018-09-15 ENCOUNTER — Encounter: Payer: Self-pay | Admitting: Gastroenterology

## 2018-09-20 ENCOUNTER — Other Ambulatory Visit: Payer: Self-pay

## 2018-09-20 ENCOUNTER — Encounter (INDEPENDENT_AMBULATORY_CARE_PROVIDER_SITE_OTHER): Payer: Self-pay | Admitting: Primary Care

## 2018-09-20 ENCOUNTER — Ambulatory Visit (INDEPENDENT_AMBULATORY_CARE_PROVIDER_SITE_OTHER): Payer: Self-pay | Admitting: Primary Care

## 2018-09-20 VITALS — BP 145/93 | HR 93 | Temp 97.5°F | Ht 69.0 in | Wt 285.6 lb

## 2018-09-20 DIAGNOSIS — J9601 Acute respiratory failure with hypoxia: Secondary | ICD-10-CM

## 2018-09-20 DIAGNOSIS — Z9119 Patient's noncompliance with other medical treatment and regimen: Secondary | ICD-10-CM

## 2018-09-20 DIAGNOSIS — E1165 Type 2 diabetes mellitus with hyperglycemia: Secondary | ICD-10-CM

## 2018-09-20 DIAGNOSIS — Z91199 Patient's noncompliance with other medical treatment and regimen due to unspecified reason: Secondary | ICD-10-CM

## 2018-09-20 DIAGNOSIS — I10 Essential (primary) hypertension: Secondary | ICD-10-CM

## 2018-09-20 DIAGNOSIS — Z760 Encounter for issue of repeat prescription: Secondary | ICD-10-CM

## 2018-09-20 DIAGNOSIS — R05 Cough: Secondary | ICD-10-CM

## 2018-09-20 DIAGNOSIS — Z23 Encounter for immunization: Secondary | ICD-10-CM

## 2018-09-20 DIAGNOSIS — E119 Type 2 diabetes mellitus without complications: Secondary | ICD-10-CM

## 2018-09-20 DIAGNOSIS — R053 Chronic cough: Secondary | ICD-10-CM

## 2018-09-20 DIAGNOSIS — R600 Localized edema: Secondary | ICD-10-CM

## 2018-09-20 DIAGNOSIS — G4733 Obstructive sleep apnea (adult) (pediatric): Secondary | ICD-10-CM

## 2018-09-20 DIAGNOSIS — Z6841 Body Mass Index (BMI) 40.0 and over, adult: Secondary | ICD-10-CM

## 2018-09-20 LAB — POCT GLYCOSYLATED HEMOGLOBIN (HGB A1C): Hemoglobin A1C: 8.5 % — AB (ref 4.0–5.6)

## 2018-09-20 LAB — GLUCOSE, POCT (MANUAL RESULT ENTRY): POC Glucose: 251 mg/dl — AB (ref 70–99)

## 2018-09-20 MED ORDER — FUROSEMIDE 40 MG PO TABS: 40.0000 mg | ORAL_TABLET | Freq: Every day | ORAL | 2 refills | Status: DC | PRN

## 2018-09-20 MED ORDER — METFORMIN HCL ER 500 MG PO TB24: 1000.0000 mg | ORAL_TABLET | Freq: Every day | ORAL | 3 refills | Status: DC

## 2018-09-20 MED ORDER — BUDESONIDE-FORMOTEROL FUMARATE 160-4.5 MCG/ACT IN AERO: 2.0000 | INHALATION_SPRAY | Freq: Two times a day (BID) | RESPIRATORY_TRACT | 3 refills | Status: DC

## 2018-09-20 MED ORDER — INSULIN GLARGINE 100 UNIT/ML ~~LOC~~ SOLN: 10.0000 [IU] | Freq: Every day | SUBCUTANEOUS | 3 refills | Status: DC

## 2018-09-20 MED ORDER — ALBUTEROL SULFATE HFA 108 (90 BASE) MCG/ACT IN AERS: 2.0000 | INHALATION_SPRAY | RESPIRATORY_TRACT | 1 refills | Status: DC | PRN

## 2018-09-20 MED ORDER — GLIMEPIRIDE 2 MG PO TABS: 2.0000 mg | ORAL_TABLET | Freq: Every day | ORAL | 3 refills | Status: DC

## 2018-09-20 MED ORDER — POTASSIUM CHLORIDE CRYS ER 20 MEQ PO TBCR: 20.0000 meq | EXTENDED_RELEASE_TABLET | Freq: Every day | ORAL | 3 refills | Status: DC

## 2018-09-20 NOTE — Progress Notes (Signed)
Subjective:  Patient ID: Dillon Wang, male    DOB: 06-01-1965  Age: 53 y.o. MRN: [id number]  CC: Cough (causes him to faint. neurology denies seizure) and Diabetes   HPI Dillon Wang presents for management of uncontrolled  diabetes, hypertension, chronic cough and drug abuse. Patient use cocaine 09/07/2018 and expressed he wanted to stop. Reach out to CSW/ CNM and 3 options for treatment [facility]  and [facility] in patients and [facility]- [phone] and [facility]- [phone]. Patient is not incline to have inpatient  treatment. Explained the risk is higher to relapse in a out patient setting. We also discussed the effects to on the heart with using crack and cocaine.   PMH:  Testicular cancer, HTN, PND, OSA,alcohol abuse,crack/cocaine abuse, fatty liver, and hepatomegaly acute respiratory failure with hypoxia, tachypnea, volume depletion, , elevated lactic acid level, type 2 diabetes and acute toxic metabolic encephalopathy secondary to alcohol intoxication and  drug abuse.   Outpatient Medications Prior to Visit  Medication Sig Dispense Refill  . amLODipine (NORVASC) 10 MG tablet Take 1 tablet (10 mg total) by mouth daily. 90 tablet 1  . aspirin 81 MG chewable tablet Chew 1 tablet (81 mg total) by mouth daily at 6 (six) AM. 90 tablet 3  . atorvastatin (LIPITOR) 40 MG tablet Take 1 tablet (40 mg total) by mouth daily. 90 tablet 1  . hydrochlorothiazide (HYDRODIURIL) 25 MG tablet Take 1 tablet (25 mg total) by mouth daily. Take on tablet in the morning. 90 tablet 1  . Misc. Devices MISC Auto bipap with Medium size [employer] Full Face Mask Simplus mask/ heated humidification. Autopap settings 15-20 cm H2O. 1 each 0  . Needles & Syringes MISC 10 Units by Does not apply route 4 (four) times daily. 10 units of Lantus at bedtime 240 each 0  . albuterol (PROVENTIL HFA;VENTOLIN HFA) 108 (90 Base) MCG/ACT inhaler Inhale 2 puffs into the lungs every 4 (four) hours as needed for wheezing  or shortness of breath.    . budesonide-formoterol (SYMBICORT) 160-4.5 MCG/ACT inhaler Inhale 2 puffs into the lungs 2 (two) times daily. 1 Inhaler 3  . furosemide (LASIX) 40 MG tablet TAKE ONE TABLET BY MOUTH EVERY DAY AS NEEDED 60 tablet 2  . glimepiride (AMARYL) 2 MG tablet Take 1 tablet (2 mg total) by mouth daily before breakfast. 30 tablet 3  . insulin glargine (LANTUS) 100 UNIT/ML injection Inject 0.1 mLs (10 Units total) into the skin at bedtime. 300 mL 0  . metFORMIN (GLUCOPHAGE XR) 500 MG 24 hr tablet Take 2 tablets (1,000 mg total) by mouth daily with breakfast. 60 tablet 5  . levETIRAcetam (KEPPRA) 750 MG tablet Take 1 tablet (750 mg total) by mouth 2 (two) times daily. 60 tablet 1   No facility-administered medications prior to visit.     ROS Review of Systems  Constitutional: Positive for fatigue.  Respiratory: Positive for cough and shortness of breath.   Gastrointestinal: Positive for abdominal distention.  All other systems reviewed and are negative.   Objective:  BP (!) 145/93 (BP Location: Right Arm, Patient Position: Sitting, Cuff Size: Large)   Pulse 93   Temp (!) 97.5 F (36.4 C) (Tympanic)   Ht 5\' 9"  (1.753 m)   Wt 285 lb 9.6 oz (129.5 kg)   SpO2 99%   BMI 42.18 kg/m   BP/Weight 09/20/2018 08/31/2018 2/40/9735  Systolic BP 329 - 924  Diastolic BP 93 - 89  Wt. (Lbs) 285.6 240 279  BMI  42.18 35.44 41.2    Physical Exam Constitutional:      Appearance: He is obese.  HENT:     Head: Normocephalic.     Nose: Nose normal.     Mouth/Throat:     Mouth: Mucous membranes are moist.  Eyes:     Extraocular Movements: Extraocular movements intact.  Cardiovascular:     Rate and Rhythm: Normal rate and regular rhythm.     Heart sounds: Normal heart sounds.  Pulmonary:     Effort: Pulmonary effort is normal.     Breath sounds: Normal breath sounds.  Abdominal:     General: Bowel sounds are normal. There is distension.     Palpations: Abdomen is soft.   Musculoskeletal: Normal range of motion.  Skin:    General: Skin is warm and dry.  Neurological:     Mental Status: He is alert and oriented to person, place, and time.  Psychiatric:        Mood and Affect: Mood normal.      Assessment & Plan:   1. Type 2 diabetes mellitus without complication, without long-term current use of insulin (HCC) ADA recommends the following therapeutic goals for glycemic control related to A1c measurements: Goal of therapy: Less than 6.5 hemoglobin A1c.  Reference clinical practice recommendations. Foods that are high in carbohydrates are the following rice, potatoes, breads, sugars, and pastas.  Reduction in the intake (eating) will assist in lowering your blood sugars. - HgB A1c - Glucose (CBG) - metFORMIN (GLUCOPHAGE XR) 500 MG 24 hr tablet; Take 2 tablets (1,000 mg total) by mouth daily with breakfast.  Dispense: 60 tablet; Refill: 3 - Glucose, fasting; Future - Hepatic function panel; Future - Lipid panel; Future - Amb Referral to Nutrition and Diabetic E  2. OSA (obstructive sleep apnea) He endorses he wears his sleep pap nightly fatigue make be due to other activities.  3. Personal history of noncompliance with medical treatment, presenting hazards to health Uses elicit drugs has hypertension risk for MI, and not adhering to prescribed medication treatment.   4. Chronic cough Unknown etiology appointment/referral was made for pulmonology in [facility] per his request- he did not keep the appointment.  5. Hypertension, unspecified type Counseled on blood pressure goal of less than 130/80, low-sodium, DASH diet, medication compliance, 150 minutes of moderate intensity exercise per week. Discussed medication compliance, adverse effects. - potassium chloride SA (K-DUR) 20 MEQ tablet; Take 1 tablet (20 mEq total) by mouth daily.  Dispense: 30 tablet; Refill: 3  6. Medication refill - glimepiride (AMARYL) 2 MG tablet; Take 1 tablet (2 mg total)  by mouth daily before breakfast.  Dispense: 30 tablet; Refill: 3  7. Bilateral lower extremity edema - furosemide (LASIX) 40 MG tablet; Take 1 tablet (40 mg total) by mouth daily as needed.  Dispense: 60 tablet; Refill: 2  8. Body mass index (BMI) of 40.1-44.9 in adult [facility]) Morbid Obesity is (40-44.9) indicating an excess in caloric intake or underlining conditions. This may lead to other co-morbidities. Lifestyle modifications of diet and exercise may reduce obesity.   10. Acute respiratory failure with hypoxemia (HCC) Hypoventilation syndrome. obstructive sleep apnea .  Meds ordered this encounter  Medications  . insulin glargine (LANTUS) 100 UNIT/ML injection    Sig: Inject 0.1 mLs (10 Units total) into the skin at bedtime.    Dispense:  300 mL    Refill:  3  . albuterol (VENTOLIN HFA) 108 (90 Base) MCG/ACT inhaler    Sig:  Inhale 2 puffs into the lungs every 4 (four) hours as needed for wheezing or shortness of breath.    Dispense:  6.7 g    Refill:  1  . budesonide-formoterol (SYMBICORT) 160-4.5 MCG/ACT inhaler    Sig: Inhale 2 puffs into the lungs 2 (two) times daily.    Dispense:  1 Inhaler    Refill:  3  . glimepiride (AMARYL) 2 MG tablet    Sig: Take 1 tablet (2 mg total) by mouth daily before breakfast.    Dispense:  30 tablet    Refill:  3  . metFORMIN (GLUCOPHAGE XR) 500 MG 24 hr tablet    Sig: Take 2 tablets (1,000 mg total) by mouth daily with breakfast.    Dispense:  60 tablet    Refill:  3  . furosemide (LASIX) 40 MG tablet    Sig: Take 1 tablet (40 mg total) by mouth daily as needed.    Dispense:  60 tablet    Refill:  2  . potassium chloride SA (K-DUR) 20 MEQ tablet    Sig: Take 1 tablet (20 mEq total) by mouth daily.    Dispense:  30 tablet    Refill:  3    Follow-up: Return in 3 months (on 12/21/2018).   Kerin Perna NP

## 2018-09-20 NOTE — Patient Instructions (Addendum)
° °Calorie Counting for Weight Loss °Calories are units of energy. Your body needs a certain amount of calories from food to keep you going throughout the day. When you eat more calories than your body needs, your body stores the extra calories as fat. When you eat fewer calories than your body needs, your body burns fat to get the energy it needs. °Calorie counting means keeping track of how many calories you eat and drink each day. Calorie counting can be helpful if you need to lose weight. If you make sure to eat fewer calories than your body needs, you should lose weight. Ask your health care provider what a healthy weight is for you. °For calorie counting to work, you will need to eat the right number of calories in a day in order to lose a healthy amount of weight per week. A dietitian can help you determine how many calories you need in a day and will give you suggestions on how to reach your calorie goal. °· A healthy amount of weight to lose per week is usually 1-2 lb (0.5-0.9 kg). This usually means that your daily calorie intake should be reduced by 500-750 calories. °· Eating 1,200 - 1,500 calories per day can help most women lose weight. °· Eating 1,500 - 1,800 calories per day can help most men lose weight. °What is my plan? °My goal is to have __________ calories per day. °If I have this many calories per day, I should lose around __________ pounds per week. °What do I need to know about calorie counting? °In order to meet your daily calorie goal, you will need to: °· Find out how many calories are in each food you would like to eat. Try to do this before you eat. °· Decide how much of the food you plan to eat. °· Write down what you ate and how many calories it had. Doing this is called keeping a food log. °To successfully lose weight, it is important to balance calorie counting with a healthy lifestyle that includes regular activity. Aim for 150 minutes of moderate exercise (such as walking) or 75  minutes of vigorous exercise (such as running) each week. °Where do I find calorie information? ° °The number of calories in a food can be found on a Nutrition Facts label. If a food does not have a Nutrition Facts label, try to look up the calories online or ask your dietitian for help. °Remember that calories are listed per serving. If you choose to have more than one serving of a food, you will have to multiply the calories per serving by the amount of servings you plan to eat. For example, the label on a package of bread might say that a serving size is 1 slice and that there are 90 calories in a serving. If you eat 1 slice, you will have eaten 90 calories. If you eat 2 slices, you will have eaten 180 calories. °How do I keep a food log? °Immediately after each meal, record the following information in your food log: °· What you ate. Don't forget to include toppings, sauces, and other extras on the food. °· How much you ate. This can be measured in cups, ounces, or number of items. °· How many calories each food and drink had. °· The total number of calories in the meal. °Keep your food log near you, such as in a small notebook in your pocket, or use a mobile app or website. Some programs will   calculate calories for you and show you how many calories you have left for the day to meet your goal. °What are some calorie counting tips? ° °· Use your calories on foods and drinks that will fill you up and not leave you hungry: °? Some examples of foods that fill you up are nuts and nut butters, vegetables, lean proteins, and high-fiber foods like whole grains. High-fiber foods are foods with more than 5 g fiber per serving. °? Drinks such as sodas, specialty coffee drinks, alcohol, and juices have a lot of calories, yet do not fill you up. °· Eat nutritious foods and avoid empty calories. Empty calories are calories you get from foods or beverages that do not have many vitamins or protein, such as candy, sweets, and  soda. It is better to have a nutritious high-calorie food (such as an avocado) than a food with few nutrients (such as a bag of chips). °· Know how many calories are in the foods you eat most often. This will help you calculate calorie counts faster. °· Pay attention to calories in drinks. Low-calorie drinks include water and unsweetened drinks. °· Pay attention to nutrition labels for "low fat" or "fat free" foods. These foods sometimes have the same amount of calories or more calories than the full fat versions. They also often have added sugar, starch, or salt, to make up for flavor that was removed with the fat. °· Find a way of tracking calories that works for you. Get creative. Try different apps or programs if writing down calories does not work for you. °What are some portion control tips? °· Know how many calories are in a serving. This will help you know how many servings of a certain food you can have. °· Use a measuring cup to measure serving sizes. You could also try weighing out portions on a kitchen scale. With time, you will be able to estimate serving sizes for some foods. °· Take some time to put servings of different foods on your favorite plates, bowls, and cups so you know what a serving looks like. °· Try not to eat straight from a bag or box. Doing this can lead to overeating. Put the amount you would like to eat in a cup or on a plate to make sure you are eating the right portion. °· Use smaller plates, glasses, and bowls to prevent overeating. °· Try not to multitask (for example, watch TV or use your computer) while eating. If it is time to eat, sit down at a table and enjoy your food. This will help you to know when you are full. It will also help you to be aware of what you are eating and how much you are eating. °What are tips for following this plan? °Reading food labels °· Check the calorie count compared to the serving size. The serving size may be smaller than what you are used to  eating. °· Check the source of the calories. Make sure the food you are eating is high in vitamins and protein and low in saturated and trans fats. °Shopping °· Read nutrition labels while you shop. This will help you make healthy decisions before you decide to purchase your food. °· Make a grocery list and stick to it. °Cooking °· Try to cook your favorite foods in a healthier way. For example, try baking instead of frying. °· Use low-fat dairy products. °Meal planning °· Use more fruits and vegetables. Half of your plate should be   fruits and vegetables.  Include lean proteins like poultry and fish. How do I count calories when eating out?  Ask for smaller portion sizes.  Consider sharing an entree and sides instead of getting your own entree.  If you get your own entree, eat only half. Ask for a box at the beginning of your meal and put the rest of your entree in it so you are not tempted to eat it.  If calories are listed on the menu, choose the lower calorie options.  Choose dishes that include vegetables, fruits, whole grains, low-fat dairy products, and lean protein.  Choose items that are boiled, broiled, grilled, or steamed. Stay away from items that are buttered, battered, fried, or served with cream sauce. Items labeled "crispy" are usually fried, unless stated otherwise.  Choose water, low-fat milk, unsweetened iced tea, or other drinks without added sugar. If you want an alcoholic beverage, choose a lower calorie option such as a glass of wine or light beer.  Ask for dressings, sauces, and syrups on the side. These are usually high in calories, so you should limit the amount you eat.  If you want a salad, choose a garden salad and ask for grilled meats. Avoid extra toppings like bacon, cheese, or fried items. Ask for the dressing on the side, or ask for olive oil and vinegar or lemon to use as dressing.  Estimate how many servings of a food you are given. For example, a serving of  cooked rice is  cup or about the size of half a baseball. Knowing serving sizes will help you be aware of how much food you are eating at restaurants. The list below tells you how big or small some common portion sizes are based on everyday objects: ? 1 oz--4 stacked dice. ? 3 oz--1 deck of cards. ? 1 tsp--1 die. ? 1 Tbsp-- a ping-pong ball. ? 2 Tbsp--1 ping-pong ball. ?  cup-- baseball. ? 1 cup--1 baseball. Summary  Calorie counting means keeping track of how many calories you eat and drink each day. If you eat fewer calories than your body needs, you should lose weight.  A healthy amount of weight to lose per week is usually 1-2 lb (0.5-0.9 kg). This usually means reducing your daily calorie intake by 500-750 calories.  The number of calories in a food can be found on a Nutrition Facts label. If a food does not have a Nutrition Facts label, try to look up the calories online or ask your dietitian for help.  Use your calories on foods and drinks that will fill you up, and not on foods and drinks that will leave you hungry.  Use smaller plates, glasses, and bowls to prevent overeating. This information is not intended to replace advice given to you by your health care provider. Make sure you discuss any questions you have with your health care provider. Document Released: 02/17/2005 Document Revised: 11/06/2017 Document Reviewed: 01/18/2016 Elsevier Patient Education  2020 [place] and Cholesterol Restricted Eating Plan Getting too much fat and cholesterol in your diet may cause health problems. Choosing the right foods helps keep your fat and cholesterol at normal levels. This can keep you from getting certain diseases. Your doctor may recommend an eating plan that includes:  Total fat: ______% or less of total calories a day.  Saturated fat: ______% or less of total calories a day.  Cholesterol: less than _________mg a day.  Fiber: ______g a day. What are tips  for  following this plan? Meal planning  At meals, divide your plate into four equal parts: ? Fill one-half of your plate with vegetables and green salads. ? Fill one-fourth of your plate with whole grains. ? Fill one-fourth of your plate with low-fat (lean) protein foods.  Eat fish that is high in omega-3 fats at least two times a week. This includes mackerel, tuna, sardines, and salmon.  Eat foods that are high in fiber, such as whole grains, beans, apples, broccoli, carrots, peas, and barley. General tips   Work with your doctor to lose weight if you need to.  Avoid: ? Foods with added sugar. ? Fried foods. ? Foods with partially hydrogenated oils.  Limit alcohol intake to no more than 1 drink a day for nonpregnant women and 2 drinks a day for men. One drink equals 12 oz of beer, 5 oz of wine, or 1 oz of hard liquor. Reading food labels  Check food labels for: ? Trans fats. ? Partially hydrogenated oils. ? Saturated fat (g) in each serving. ? Cholesterol (mg) in each serving. ? Fiber (g) in each serving.  Choose foods with healthy fats, such as: ? Monounsaturated fats. ? Polyunsaturated fats. ? Omega-3 fats.  Choose grain products that have whole grains. Look for the word "whole" as the first word in the ingredient list. Cooking  Cook foods using low-fat methods. These include baking, boiling, grilling, and broiling.  Eat more home-cooked foods. Eat at restaurants and buffets less often.  Avoid cooking using saturated fats, such as butter, cream, palm oil, palm kernel oil, and coconut oil. Recommended foods  Fruits  All fresh, canned (in natural juice), or frozen fruits. Vegetables  Fresh or frozen vegetables (raw, steamed, roasted, or grilled). Green salads. Grains  Whole grains, such as whole wheat or whole grain breads, crackers, cereals, and pasta. Unsweetened oatmeal, bulgur, barley, quinoa, or brown rice. Corn or whole wheat flour tortillas. Meats and other  protein foods  Ground beef (85% or leaner), grass-fed beef, or beef trimmed of fat. Skinless chicken or [place]. Ground chicken or [place]. Pork trimmed of fat. All fish and seafood. Egg whites. Dried beans, peas, or lentils. Unsalted nuts or seeds. Unsalted canned beans. Nut butters without added sugar or oil. Dairy  Low-fat or nonfat dairy products, such as skim or 1% milk, 2% or reduced-fat cheeses, low-fat and fat-free ricotta or cottage cheese, or plain low-fat and nonfat yogurt. Fats and oils  Tub margarine without trans fats. Light or reduced-fat mayonnaise and salad dressings. Avocado. Olive, canola, sesame, or safflower oils. The items listed above may not be a complete list of foods and beverages you can eat. Contact a dietitian for more information. Foods to avoid Fruits  Canned fruit in heavy syrup. Fruit in cream or butter sauce. Fried fruit. Vegetables  Vegetables cooked in cheese, cream, or butter sauce. Fried vegetables. Grains  White bread. White pasta. White rice. Cornbread. Bagels, pastries, and croissants. Crackers and snack foods that contain trans fat and hydrogenated oils. Meats and other protein foods  Fatty cuts of meat. Ribs, chicken wings, bacon, sausage, bologna, salami, chitterlings, fatback, hot dogs, bratwurst, and packaged lunch meats. Liver and organ meats. Whole eggs and egg yolks. Chicken and [place] with skin. Fried meat. Dairy  Whole or 2% milk, cream, half-and-half, and cream cheese. Whole milk cheeses. Whole-fat or sweetened yogurt. Full-fat cheeses. Nondairy creamers and whipped toppings. Processed cheese, cheese spreads, and cheese curds. Beverages  Alcohol. Sugar-sweetened drinks such as sodas,  lemonade, and fruit drinks. Fats and oils  Butter, stick margarine, lard, shortening, ghee, or bacon fat. Coconut, palm kernel, and palm oils. Sweets and desserts  Corn syrup, sugars, honey, and molasses. Candy. Jam and jelly. Syrup. Sweetened cereals.  Cookies, pies, cakes, donuts, muffins, and ice cream. The items listed above may not be a complete list of foods and beverages you should avoid. Contact a dietitian for more information. Summary  Choosing the right foods helps keep your fat and cholesterol at normal levels. This can keep you from getting certain diseases.  At meals, fill one-half of your plate with vegetables and green salads.  Eat high-fiber foods, like whole grains, beans, apples, carrots, peas, and barley.  Limit added sugar, saturated fats, alcohol, and fried foods. This information is not intended to replace advice given to you by your health care provider. Make sure you discuss any questions you have with your health care provider. Document Released: 08/19/2011 Document Revised: 10/21/2017 Document Reviewed: 11/04/2016 Elsevier Patient Education  2020 [place] for [place] is a complex disease of the brain that causes an uncontrollable (compulsive) need for:  A substance. This includes alcohol, illegal drugs, or prescription medicines, such as painkillers.  An activity or behavior, such as gambling or shopping. Addiction changes the way your brain works. Because of this change:  The need for the medicine, drug, or activity can become so strong that you think about it all the time.  Getting more and more of your addiction becomes the most important thing to you.  You may find yourself leaving other activities and relationships to pursue your addiction.  You can become physically dependent on a substance.  Your health, behavior, emotions, and relationships can change for the worse. How do I know if I need treatment for addiction? Addiction is a progressive disease. Without treatment, addiction can get worse. Living with addiction puts you at higher risk for injury, poor health, loss of employment, loss of money, and even death. You might need treatment for addiction if:  You  have tried to stop or cut down, but you have not succeeded.  You find it annoying that your friends and family are concerned about your use or behavior.  You feel guilty about your use or behavior.  You need a particular substance or activity to start your day or to calm down.  You are running out of money because of your addiction.  You have done something illegal to support your addiction.  Your addiction has caused you: ? Health problems. ? Trouble in school, work, home, or with the police. ? To devote all your time to your addiction, and not to other responsibilities. ? To tell lies in order to hide your problem. What types of treatment are available? There may be options for treatment programs and plans based on your addiction, condition, needs, and preferences. No single treatment is right for everyone.  Treatment programs can be: ? Outpatient. You live at home and go to work or school, but you go to a clinic for treatment. ? Inpatient. You live and sleep at the program facility during treatment.  Programs may include: ? Medicine. You may need medicine to treat the addiction itself, or to treat anxiety or depression. ? Counseling and behavior therapy. This can help individuals and families behave in healthier ways and relate more effectively. ? Support groups. Confidential group therapy, such as a 12-step program, can help individuals and families during treatment and recovery. ?  A combination of education, counseling, and a 12-step, spirituality-based approach. What should I consider when selecting a treatment program? Think about your individual requirements when selecting a treatment program. Ask about:  The overall approach to treatment. ? Some programs are strictly 12-step programs. Some have a more flexible approach. ? Programs may differ in length of stay, setting, and size. ? Some programs include your family in your treatment plan. Support may be offered to them  throughout the treatment process, as well as instructions for them when you are discharged. ? You may continue to receive support after you have left the program.  The types of medical services that are offered. Find out if the program: ? Offers specific treatment for your particular addiction. ? Meets all of your needs, including physical and cultural needs. ? Includes any medicines you might need. ? Offers mental health counseling as part of your treatment. ? Offers the 12-step meetings at the center, or if transport is available for patients to attend meetings at other locations.  The cost and types of insurance that are accepted. ? Some programs are sponsored by the government. They support patients who do not have private insurance. ? If you do not have insurance, or if you choose to attend a program that does not accept your insurance, call the treatment center. Tell them your financial needs and whether a payment plan can be set up. ? There are also organizations that will help you find the resources for treatment. You can find them online by searching "treatment for addiction."  If the program is certified by the appropriate government agency. Where to find support  Your health care provider can help you to find the right treatment. These discussions are confidential.  The [employer] on Alcoholism and Drug Dependence (NCADD). This group has information about treatment centers and programs for people who have an addiction and for family members. ? Call: 1-800-NCA-CALL ((954)693-2000). ? Visit the website: https://www.ncadd.org/  The Substance Abuse and Mental Health Services Administration [facility]). This organization will help you find publicly funded treatment centers, help hotlines, and counseling services near you. ? Call: 1-800-662-HELP (905)408-4972). ? Visit the website: www.findtreatment.SamedayNews.com.cy  The National Problem Gambling Helpline. This is a 24-hour  confidential helpline for gambling addiction. ? Call: 734-126-9349 ? Visit the website: http://wiggins.com/ In countries outside of the [place] and [place], look in [employer] for contact information for services in your area. Follow these instructions at home:  Find supportive people who will help you stay away from your addiction and stay sober.  Do not use the substance or engage in the activity.  If you have been through treatment: ? Follow your plan. The plan is usually developed by you and your health care provider during treatment. ? Go to meetings with other people in recovery. ? Avoid people, situations, and things that lead you to do the things you are addicted to (triggers). Summary 3 options for treatment [facility] in patients and ADS- [phone] and [place] changes the way your brain works. These changes cause a desire to repeat and increase the use of the a substance or behavior.  Addiction is a progressive disease. Without treatment, addiction can get worse. Living with addiction puts you at higher risk for injury, poor health, loss of employment, loss of money, and even death.  There may be options for treatment programs and plans based on your addiction, condition, needs, and preferences. No single treatment is right  for everyone.  Your health care provider can help you to find the right treatment. These discussions are confidential. This information is not intended to replace advice given to you by your health care provider. Make sure you discuss any questions you have with your health care provider. Document Released: 01/16/2005 Document Revised: 03/18/2017 Document Reviewed: 03/18/2017 Elsevier Patient Education  2020 [employer].

## 2018-09-21 ENCOUNTER — Telehealth: Payer: Self-pay | Admitting: Gastroenterology

## 2018-09-21 ENCOUNTER — Encounter: Payer: Self-pay | Admitting: *Deleted

## 2018-09-21 NOTE — Telephone Encounter (Signed)

## 2018-09-21 NOTE — Telephone Encounter (Signed)
No, just go ahead and have him rescheduled and new prep issue to him.  Thank you.

## 2018-09-21 NOTE — Telephone Encounter (Signed)
Pt is scheduled for colonoscopy tomorrow.  Pharmacy stated that they have never received script for prep.  Please send to [facility].  Phone [phone]

## 2018-09-21 NOTE — Telephone Encounter (Signed)
Pt.went to pharmacy today to pick up prep,they said they did not get rx,attempted to give rx for suprep and they said they didn't have anything but golytely prep spoke with pt. And asked if he could come get instructions for prep and he stated "can't I reschedule until I get myself together,attempted to give pt. Prep but after talking with patient and wife pt. Has been eating all day,has not been on clear liquid diet,they both agreed to reschedule procedure. Informed them that there may be a charge for cancelling procedure,they stated "that's fine.wife also informed me that patient does not have insurance. Rescheduled procedure for 09/30/18 @ 11:30 A.M. I instructed wife to come by office to pick up sample of suprep and instructions,she stated" I can pick them up tomorrow at 1 p.m.instructed her to come to 4th floor and ask for me.

## 2018-09-22 ENCOUNTER — Encounter: Payer: Self-pay | Admitting: Gastroenterology

## 2018-09-22 NOTE — Telephone Encounter (Signed)
Follow up call placed to see if pt. Was coming to pick up sample prep and instruction,message left called x2.

## 2018-09-28 ENCOUNTER — Telehealth: Payer: Self-pay | Admitting: *Deleted

## 2018-09-28 ENCOUNTER — Other Ambulatory Visit (INDEPENDENT_AMBULATORY_CARE_PROVIDER_SITE_OTHER): Payer: Self-pay | Admitting: Primary Care

## 2018-09-28 ENCOUNTER — Telehealth (INDEPENDENT_AMBULATORY_CARE_PROVIDER_SITE_OTHER): Payer: Self-pay

## 2018-09-28 DIAGNOSIS — G4733 Obstructive sleep apnea (adult) (pediatric): Secondary | ICD-10-CM

## 2018-09-28 NOTE — Telephone Encounter (Signed)
FWD to PCP. Echo Propp S Dolph Tavano, CMA  

## 2018-09-28 NOTE — Telephone Encounter (Signed)
Patient called to request a referral to pulmonology in [facility]. Patient states that he tried to make an appointment but was advice that his PCP needs to send a new referral since he had an appointment 6 months ago and no showed.  Please advice [phone]  Main number for [facility] [phone] (ask for pulmonary department)   Thank you Whitney Post

## 2018-09-28 NOTE — Telephone Encounter (Signed)
Patient arrived on 4 th floor for his Suprep sample. Suprep sample and new instructions given to patient by Pollyann Kennedy, RN.

## 2018-09-28 NOTE — Telephone Encounter (Signed)
Called patient's home and cell #, both no answer, no voicemail, it just rings many times. I will try again later. I need to see if the patient is coming to get Suprep sample for his procedure on Thursday 09/30/2018 or does colon need to be cancelled?

## 2018-09-29 ENCOUNTER — Telehealth: Payer: Self-pay | Admitting: Gastroenterology

## 2018-09-29 NOTE — Telephone Encounter (Signed)
Patient referral has been sent to  [facility].   Thank you Whitney Post

## 2018-09-29 NOTE — Telephone Encounter (Signed)

## 2018-09-30 ENCOUNTER — Other Ambulatory Visit: Payer: Self-pay

## 2018-09-30 ENCOUNTER — Ambulatory Visit (AMBULATORY_SURGERY_CENTER): Payer: Self-pay | Admitting: Gastroenterology

## 2018-09-30 ENCOUNTER — Encounter: Payer: Self-pay | Admitting: Gastroenterology

## 2018-09-30 VITALS — BP 123/86 | HR 79 | Temp 98.6°F | Resp 12 | Ht 69.0 in | Wt 285.0 lb

## 2018-09-30 DIAGNOSIS — K640 First degree hemorrhoids: Secondary | ICD-10-CM

## 2018-09-30 DIAGNOSIS — K635 Polyp of colon: Secondary | ICD-10-CM

## 2018-09-30 DIAGNOSIS — D125 Benign neoplasm of sigmoid colon: Secondary | ICD-10-CM

## 2018-09-30 DIAGNOSIS — D124 Benign neoplasm of descending colon: Secondary | ICD-10-CM

## 2018-09-30 DIAGNOSIS — D123 Benign neoplasm of transverse colon: Secondary | ICD-10-CM

## 2018-09-30 DIAGNOSIS — K5730 Diverticulosis of large intestine without perforation or abscess without bleeding: Secondary | ICD-10-CM

## 2018-09-30 DIAGNOSIS — Z1211 Encounter for screening for malignant neoplasm of colon: Secondary | ICD-10-CM

## 2018-09-30 MED ORDER — SODIUM CHLORIDE 0.9 % IV SOLN: 500.0000 mL | INTRAVENOUS | Status: DC

## 2018-09-30 NOTE — Progress Notes (Signed)
Temp by [place] [facility] by CW

## 2018-09-30 NOTE — Progress Notes (Signed)
Called to room to assist during endoscopic procedure.  Patient ID and intended procedure confirmed with present staff. Received instructions for my participation in the procedure from the performing physician.  

## 2018-09-30 NOTE — Op Note (Signed)
[place] Patient Name: Dillon Wang Procedure Date: 09/30/2018 11:52 AM MRN: [id number] Endoscopist: Gerrit Heck , MD Age: 53 Referring MD:  Date of Birth: [birth year] Gender: Male Account #: [id number] Procedure:                Colonoscopy Indications:              Screening for colorectal malignant neoplasm, This                            is the patient's first colonoscopy Medicines:                Monitored Anesthesia Care Procedure:                Pre-Anesthesia Assessment:                           - Prior to the procedure, a History and Physical                            was performed, and patient medications and                            allergies were reviewed. The patient's tolerance of                            previous anesthesia was also reviewed. The risks                            and benefits of the procedure and the sedation                            options and risks were discussed with the patient.                            All questions were answered, and informed consent                            was obtained. Prior Anticoagulants: The patient has                            taken no previous anticoagulant or antiplatelet                            agents. ASA Grade Assessment: III - A patient with                            severe systemic disease. After reviewing the risks                            and benefits, the patient was deemed in                            satisfactory condition to undergo the procedure.  After obtaining informed consent, the colonoscope                            was passed under direct vision. Throughout the                            procedure, the patient's blood pressure, pulse, and                            oxygen saturations were monitored continuously. The                            Colonoscope was introduced through the anus and                            advanced to the the  cecum, identified by                            appendiceal orifice and ileocecal valve. The                            colonoscopy was performed without difficulty. The                            patient tolerated the procedure well. The quality                            of the bowel preparation was adequate. The                            ileocecal valve, appendiceal orifice, and rectum                            were photographed. Scope In: 11:53:27 AM Scope Out: 12:13:42 PM Scope Withdrawal Time: 0 hours 13 minutes 44 seconds  Total Procedure Duration: 0 hours 20 minutes 15 seconds  Findings:                 The perianal and digital rectal examinations were                            normal.                           Six sessile polyps were found in the sigmoid colon                            (1), descending colon (3), and transverse colon                            (2). The polyps were 3 to 6 mm in size. These                            polyps were removed with a cold snare. Resection  and retrieval were complete. Estimated blood loss                            was minimal.                           A few small-mouthed diverticula were found in the                            sigmoid colon.                           Non-bleeding internal hemorrhoids were found during                            retroflexion. The hemorrhoids were small. Complications:            No immediate complications. Estimated Blood Loss:     Estimated blood loss was minimal. Impression:               - Six 3 to 6 mm polyps in the sigmoid colon, in the                            descending colon and in the transverse colon,                            removed with a cold snare. Resected and retrieved.                           - Diverticulosis in the sigmoid colon.                           - Non-bleeding internal hemorrhoids. Recommendation:           - Patient has a contact number  available for                            emergencies. The signs and symptoms of potential                            delayed complications were discussed with the                            patient. Return to normal activities tomorrow.                            Written discharge instructions were provided to the                            patient.                           - Resume previous diet.                           - Continue present medications.                           -  Await pathology results.                           - Repeat colonoscopy in 3 - 5 years for                            surveillance based on pathology results.                           - Recommend repeat colonoscopy performed at the                            hospital due to body habitus and chronic cough.                           - Return to GI clinic PRN.                           - Use fiber, for example Citrucel, Fibercon, Konsyl                            or Metamucil. Gerrit Heck, MD 09/30/2018 12:18:20 PM

## 2018-09-30 NOTE — Patient Instructions (Addendum)
6 polyps removed and sent to pathology.  Hemorrhoid and Diverticulosis.  Daily fiber recommended.  Repeat colonoscopy in 3- 5 years.     YOU HAD AN ENDOSCOPIC PROCEDURE TODAY AT [facility]:   Refer to the procedure report that was given to you for any specific questions about what was found during the examination.  If the procedure report does not answer your questions, please call your gastroenterologist to clarify.  If you requested that your care partner not be given the details of your procedure findings, then the procedure report has been included in a sealed envelope for you to review at your convenience later.  YOU SHOULD EXPECT: Some feelings of bloating in the abdomen. Passage of more gas than usual.  Walking can help get rid of the air that was put into your GI tract during the procedure and reduce the bloating. If you had a lower endoscopy (such as a colonoscopy or flexible sigmoidoscopy) you may notice spotting of blood in your stool or on the toilet paper. If you underwent a bowel prep for your procedure, you may not have a normal bowel movement for a few days.  Please Note:  You might notice some irritation and congestion in your nose or some drainage.  This is from the oxygen used during your procedure.  There is no need for concern and it should clear up in a day or so.  SYMPTOMS TO REPORT IMMEDIATELY:   Following lower endoscopy (colonoscopy or flexible sigmoidoscopy):  Excessive amounts of blood in the stool  Significant tenderness or worsening of abdominal pains  Swelling of the abdomen that is new, acute  Fever of 100F or higher   For urgent or emergent issues, a gastroenterologist can be reached at any hour by calling [phone].   DIET:  We do recommend a small meal at first, but then you may proceed to your regular diet.  Drink plenty of fluids but you should avoid alcoholic beverages for 24 hours.  ACTIVITY:  You should plan to take it easy for  the rest of today and you should NOT DRIVE or use heavy machinery until tomorrow (because of the sedation medicines used during the test).    FOLLOW UP: Our staff will call the number listed on your records 48-72 hours following your procedure to check on you and address any questions or concerns that you may have regarding the information given to you following your procedure. If we do not reach you, we will leave a message.  We will attempt to reach you two times.  During this call, we will ask if you have developed any symptoms of COVID 19. If you develop any symptoms (ie: fever, flu-like symptoms, shortness of breath, cough etc.) before then, please call [phone].  If you test positive for Covid 19 in the 2 weeks post procedure, please call and report this information to [place].    If any biopsies were taken you will be contacted by phone or by letter within the next 1-3 weeks.  Please call us at [phone] if you have not heard about the biopsies in 3 weeks.    SIGNATURES/CONFIDENTIALITY: You and/or your care partner have signed paperwork which will be entered into your electronic medical record.  These signatures attest to the fact that that the information above on your After Visit Summary has been reviewed and is understood.  Full responsibility of the confidentiality of this discharge information lies with you and/or your care-partner.

## 2018-09-30 NOTE — Progress Notes (Signed)
Report to PACU, RN, vss, BBS= Clear.  

## 2018-10-04 ENCOUNTER — Telehealth: Payer: Self-pay | Admitting: Pharmacist

## 2018-10-04 ENCOUNTER — Telehealth: Payer: Self-pay | Admitting: *Deleted

## 2018-10-04 ENCOUNTER — Telehealth: Payer: Self-pay

## 2018-10-04 NOTE — Telephone Encounter (Signed)
No answer or VM on 2nd follow up call. 

## 2018-10-04 NOTE — Telephone Encounter (Signed)
Ventolin HFA refill online with [facility]:  10/04/2018 Placed refill online with [facility] for Ventolin HFA, to ship 10/15/2018, Order# [id number].Dillon Wang

## 2018-10-04 NOTE — Telephone Encounter (Signed)
Left message on f/u call 

## 2018-10-07 ENCOUNTER — Encounter: Payer: Self-pay | Admitting: Gastroenterology

## 2018-11-04 ENCOUNTER — Telehealth: Payer: Self-pay | Admitting: Neurology

## 2018-11-04 ENCOUNTER — Ambulatory Visit: Payer: Self-pay | Admitting: Neurology

## 2018-11-04 NOTE — Telephone Encounter (Signed)
This is the second no-show for this patient, the first no-show was for an EEG study on 01 September 2018.

## 2018-11-09 ENCOUNTER — Encounter: Payer: Self-pay | Admitting: Neurology

## 2018-11-23 IMAGING — DX DG CHEST 1V PORT
1 series · 1 of 1 positions shown · non-contrast
Comparison: None.

CLINICAL DATA: Shortness of breath and chest pain.

EXAM:
PORTABLE CHEST 1 VIEW

[chest]
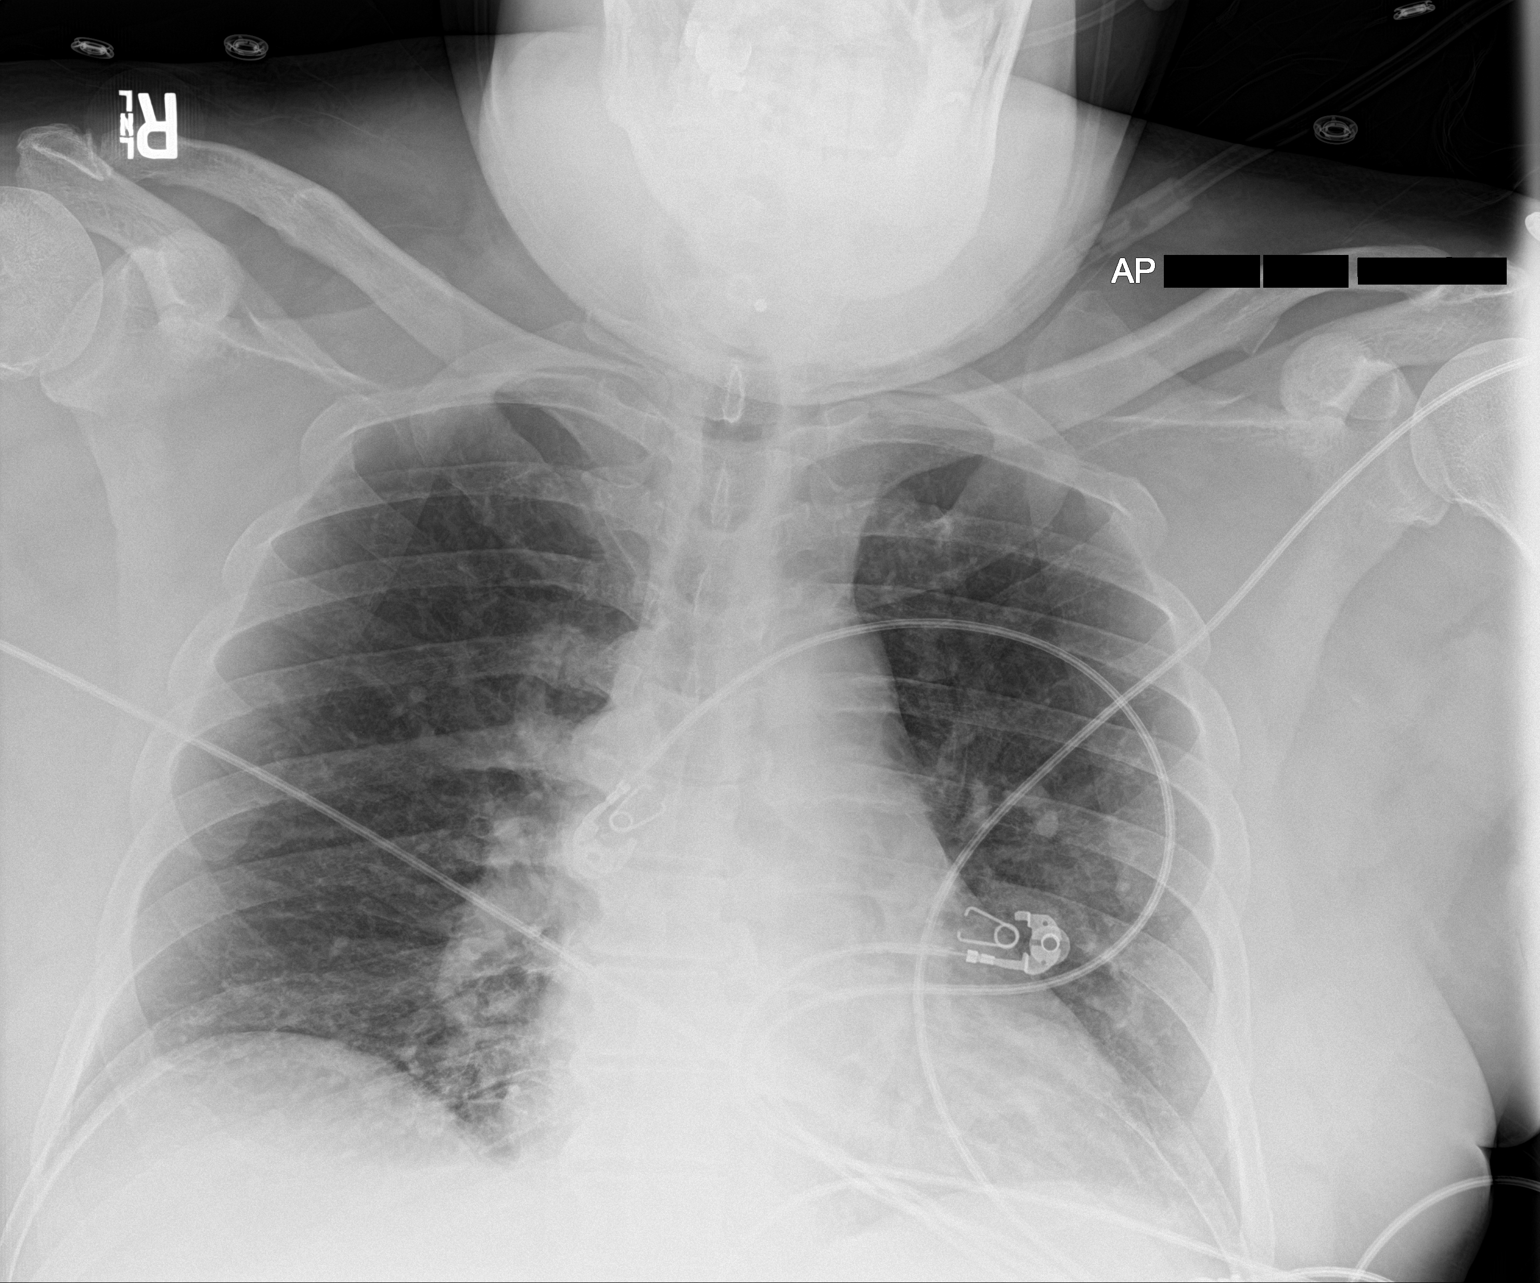

[1 of 1 positions shown; findings below may reference images not displayed]

FINDINGS: Lungs are adequately inflated without focal consolidation or
effusion. Cardiomediastinal silhouette is within normal. There are
degenerative changes of the spine.
IMPRESSION: No active disease.

## 2018-12-16 ENCOUNTER — Telehealth (INDEPENDENT_AMBULATORY_CARE_PROVIDER_SITE_OTHER): Payer: Self-pay

## 2018-12-16 ENCOUNTER — Ambulatory Visit (INDEPENDENT_AMBULATORY_CARE_PROVIDER_SITE_OTHER): Payer: Medicaid Other | Admitting: Primary Care

## 2018-12-16 NOTE — Telephone Encounter (Signed)
FWD to PCP. Synda Bagent S Delonna Ney, CMA  

## 2018-12-16 NOTE — Telephone Encounter (Signed)
Patient returned call to reschedule appointment for next week. Patient wanted to inform PCP that he has a blood clot on his right calf from when he broke it. States that the hospital put him on blood thinners. Patient wants to know if he needs to be seen sooner then next week or if he should wait for his appointment.  Please advice [phone]  Thank you Whitney Post

## 2018-12-20 NOTE — Telephone Encounter (Signed)
Keep current appoint take blood thinners as prescribed

## 2018-12-21 ENCOUNTER — Ambulatory Visit (INDEPENDENT_AMBULATORY_CARE_PROVIDER_SITE_OTHER): Payer: Medicaid Other | Admitting: Primary Care

## 2018-12-22 ENCOUNTER — Telehealth: Payer: Self-pay | Admitting: Pharmacist

## 2018-12-22 NOTE — Telephone Encounter (Signed)
12/22/2018 1:58:34 PM - Ventolin refill online with [facility]  12/22/2018 Placed refill online with [facility] for Ventolin, to ship 01/04/2019, order# [id number].Delos Haring

## 2018-12-23 ENCOUNTER — Ambulatory Visit (INDEPENDENT_AMBULATORY_CARE_PROVIDER_SITE_OTHER): Payer: Medicaid Other | Admitting: Primary Care

## 2019-01-12 ENCOUNTER — Encounter (INDEPENDENT_AMBULATORY_CARE_PROVIDER_SITE_OTHER): Payer: Self-pay | Admitting: Primary Care

## 2019-01-12 ENCOUNTER — Other Ambulatory Visit: Payer: Self-pay

## 2019-01-12 ENCOUNTER — Ambulatory Visit (INDEPENDENT_AMBULATORY_CARE_PROVIDER_SITE_OTHER): Payer: Self-pay | Admitting: Primary Care

## 2019-01-12 VITALS — BP 126/86 | HR 88 | Temp 97.3°F | Ht 69.0 in | Wt 284.0 lb

## 2019-01-12 DIAGNOSIS — Z760 Encounter for issue of repeat prescription: Secondary | ICD-10-CM

## 2019-01-12 DIAGNOSIS — Z09 Encounter for follow-up examination after completed treatment for conditions other than malignant neoplasm: Secondary | ICD-10-CM

## 2019-01-12 DIAGNOSIS — E119 Type 2 diabetes mellitus without complications: Secondary | ICD-10-CM

## 2019-01-12 DIAGNOSIS — R600 Localized edema: Secondary | ICD-10-CM

## 2019-01-12 DIAGNOSIS — I10 Essential (primary) hypertension: Secondary | ICD-10-CM

## 2019-01-12 DIAGNOSIS — E1165 Type 2 diabetes mellitus with hyperglycemia: Secondary | ICD-10-CM

## 2019-01-12 LAB — POCT GLYCOSYLATED HEMOGLOBIN (HGB A1C): Hemoglobin A1C: 7.5 % — AB (ref 4.0–5.6)

## 2019-01-12 LAB — GLUCOSE, POCT (MANUAL RESULT ENTRY): POC Glucose: 123 mg/dL — AB (ref 70–99)

## 2019-01-12 MED ORDER — FUROSEMIDE 40 MG PO TABS: 40.0000 mg | ORAL_TABLET | Freq: Every day | ORAL | 2 refills | Status: DC | PRN

## 2019-01-12 MED ORDER — NEEDLES & SYRINGES MISC: 10.0000 [IU] | Freq: Four times a day (QID) | 0 refills | Status: DC

## 2019-01-12 MED ORDER — ASPIRIN 81 MG PO CHEW: 81.0000 mg | CHEWABLE_TABLET | Freq: Every day | ORAL | 3 refills | Status: DC

## 2019-01-12 MED ORDER — METFORMIN HCL ER 500 MG PO TB24: 1000.0000 mg | ORAL_TABLET | Freq: Every day | ORAL | 3 refills | Status: DC

## 2019-01-12 MED ORDER — HYDROCHLOROTHIAZIDE 25 MG PO TABS: 25.0000 mg | ORAL_TABLET | Freq: Every day | ORAL | 1 refills | Status: DC

## 2019-01-12 MED ORDER — ALBUTEROL SULFATE HFA 108 (90 BASE) MCG/ACT IN AERS: 2.0000 | INHALATION_SPRAY | RESPIRATORY_TRACT | 1 refills | Status: DC | PRN

## 2019-01-12 MED ORDER — MISC. DEVICES MISC: 0 refills | Status: AC

## 2019-01-12 MED ORDER — GLIMEPIRIDE 2 MG PO TABS: 2.0000 mg | ORAL_TABLET | Freq: Every day | ORAL | 3 refills | Status: DC

## 2019-01-12 MED ORDER — INSULIN GLARGINE 100 UNIT/ML ~~LOC~~ SOLN: 10.0000 [IU] | Freq: Every day | SUBCUTANEOUS | 3 refills | Status: DC

## 2019-01-12 MED ORDER — ATORVASTATIN CALCIUM 40 MG PO TABS: 40.0000 mg | ORAL_TABLET | Freq: Every day | ORAL | 1 refills | Status: DC

## 2019-01-12 MED ORDER — AMLODIPINE BESYLATE 10 MG PO TABS: 10.0000 mg | ORAL_TABLET | Freq: Every day | ORAL | 1 refills | Status: DC

## 2019-01-12 MED ORDER — BUDESONIDE-FORMOTEROL FUMARATE 160-4.5 MCG/ACT IN AERO: 2.0000 | INHALATION_SPRAY | Freq: Two times a day (BID) | RESPIRATORY_TRACT | 3 refills | Status: DC

## 2019-01-12 MED ORDER — POTASSIUM CHLORIDE CRYS ER 20 MEQ PO TBCR: 20.0000 meq | EXTENDED_RELEASE_TABLET | Freq: Every day | ORAL | 3 refills | Status: DC

## 2019-01-12 NOTE — Progress Notes (Signed)
Established Patient Office Visit  Subjective:  Patient ID: Dillon Wang, male    DOB: 11/18/1965  Age: 53 y.o. MRN: [id number]  CC: No chief complaint on file.   HPI Dillon Wang presents for management of diabetes today A1C 7.5 3 months ago A1C was 8.5 improving   and hypertension well controlled 126/86 he denies shortness of breath, headaches, chest pain or lower extremity edema. [name] is in a boot right leg hairline fracture thought he was a teenager was wrestling and he lost.  Past Medical History:  Diagnosis Date  . Cancer [facility])    prostate  . Diabetes mellitus without complication ([place])   . GERD (gastroesophageal reflux disease)   . Hyperlipidemia   . Hypertension   . Obstructive sleep apnea 09/20/2017  . Seizures ([place])   . Sleep apnea   . Tussive syncope 08/05/2018    Past Surgical History:  Procedure Laterality Date  . TESTICLE REMOVAL Left     Family History  Problem Relation Age of Onset  . Hypertension Mother   . Diabetes Maternal Aunt   . Seizures Father   . Seizures Brother   . Colon cancer Neg Hx   . Esophageal cancer Neg Hx   . Rectal cancer Neg Hx   . Stomach cancer Neg Hx     Social History   Socioeconomic History  . Marital status: Single    Spouse name: Not on file  . Number of children: Not on file  . Years of education: Not on file  . Highest education level: Not on file  Occupational History  . Not on file  Social Needs  . Financial resource strain: Not on file  . Food insecurity    Worry: Not on file    Inability: Not on file  . Transportation needs    Medical: Not on file    Non-medical: Not on file  Tobacco Use  . Smoking status: Former Smoker    Types: Cigarettes  . Smokeless tobacco: Never Used  . Tobacco comment: Quit 7 months  Substance and Sexual Activity  . Alcohol use: Yes    Comment: beer every couple of days  . Drug use: Not Currently    Types: Cocaine    Comment: sober 6 months  . Sexual activity: Not on  file  Lifestyle  . Physical activity    Days per week: Not on file    Minutes per session: Not on file  . Stress: Not on file  Relationships  . Social Herbalist on phone: Not on file    Gets together: Not on file    Attends religious service: Not on file    Active member of club or organization: Not on file    Attends meetings of clubs or organizations: Not on file    Relationship status: Not on file  . Intimate partner violence    Fear of current or ex partner: Not on file    Emotionally abused: Not on file    Physically abused: Not on file    Forced sexual activity: Not on file  Other Topics Concern  . Not on file  Social History Narrative  . Not on file    Outpatient Medications Prior to Visit  Medication Sig Dispense Refill  . albuterol (VENTOLIN HFA) 108 (90 Base) MCG/ACT inhaler Inhale 2 puffs into the lungs every 4 (four) hours as needed for wheezing or shortness of breath. 6.7 g 1  . amLODipine (NORVASC)  10 MG tablet Take 1 tablet (10 mg total) by mouth daily. 90 tablet 1  . aspirin 81 MG chewable tablet Chew 1 tablet (81 mg total) by mouth daily at 6 (six) AM. 90 tablet 3  . atorvastatin (LIPITOR) 40 MG tablet Take 1 tablet (40 mg total) by mouth daily. 90 tablet 1  . budesonide-formoterol (SYMBICORT) 160-4.5 MCG/ACT inhaler Inhale 2 puffs into the lungs 2 (two) times daily. 1 Inhaler 3  . furosemide (LASIX) 40 MG tablet Take 1 tablet (40 mg total) by mouth daily as needed. 60 tablet 2  . glimepiride (AMARYL) 2 MG tablet Take 1 tablet (2 mg total) by mouth daily before breakfast. 30 tablet 3  . hydrochlorothiazide (HYDRODIURIL) 25 MG tablet Take 1 tablet (25 mg total) by mouth daily. Take on tablet in the morning. 90 tablet 1  . insulin glargine (LANTUS) 100 UNIT/ML injection Inject 0.1 mLs (10 Units total) into the skin at bedtime. 300 mL 3  . metFORMIN (GLUCOPHAGE XR) 500 MG 24 hr tablet Take 2 tablets (1,000 mg total) by mouth daily with breakfast. 60 tablet  3  . Misc. Devices MISC Auto bipap with Medium size [employer] Full Face Mask Simplus mask/ heated humidification. Autopap settings 15-20 cm H2O. 1 each 0  . Needles & Syringes MISC 10 Units by Does not apply route 4 (four) times daily. 10 units of Lantus at bedtime 240 each 0  . potassium chloride SA (K-DUR) 20 MEQ tablet Take 1 tablet (20 mEq total) by mouth daily. 30 tablet 3  . levETIRAcetam (KEPPRA) 750 MG tablet Take 1 tablet (750 mg total) by mouth 2 (two) times daily. 60 tablet 1   No facility-administered medications prior to visit.     Allergies  Allergen Reactions  . Penicillins Anaphylaxis    ROS Review of Systems  Musculoskeletal:       Right leg pain  All other systems reviewed and are negative.     Objective:    Physical Exam  Constitutional: He appears well-developed and well-nourished.  HENT:  Head: Normocephalic.  Neck: Neck supple.  Cardiovascular: Normal rate and regular rhythm.  Pulmonary/Chest: Effort normal and breath sounds normal.  Abdominal: Soft. Bowel sounds are normal. He exhibits distension.  Musculoskeletal:     Comments: Hairline fracture right leg.  Psychiatric: He has a normal mood and affect. His behavior is normal.    BP 126/86 (BP Location: Right Arm, Patient Position: Sitting, Cuff Size: Large)   Pulse 88   Temp (!) 97.3 F (36.3 C) (Temporal)   Ht 5\' 9"  (1.753 m)   Wt 284 lb (128.8 kg)   SpO2 94%   BMI 41.94 kg/m  Wt Readings from Last 3 Encounters:  01/12/19 284 lb (128.8 kg)  09/30/18 285 lb (129.3 kg)  09/20/18 285 lb 9.6 oz (129.5 kg)     Health Maintenance Due  Topic Date Due  . PNEUMOCOCCAL POLYSACCHARIDE VACCINE AGE 10-64 HIGH RISK  09/22/1968  . FOOT EXAM  09/22/1976  . OPHTHALMOLOGY EXAM  09/22/1976    There are no preventive care reminders to display for this patient.  Lab Results  Component Value Date   TSH 1.847 02/20/2018   Lab Results  Component Value Date   WBC 10.2 02/20/2018   HGB 15.0  02/20/2018   HCT 41.1 02/20/2018   MCV 83.9 02/20/2018   PLT 178 02/20/2018   Lab Results  Component Value Date   NA 135 02/22/2018   K 3.3 (L) 02/22/2018  CO2 24 02/22/2018   GLUCOSE 175 (H) 02/22/2018   BUN 12 02/22/2018   CREATININE 0.99 02/22/2018   BILITOT 1.4 (H) 02/20/2018   ALKPHOS 116 02/20/2018   AST 36 02/20/2018   ALT 37 02/20/2018   PROT 7.8 02/20/2018   ALBUMIN 4.3 02/20/2018   CALCIUM 8.5 (L) 02/22/2018   ANIONGAP 8 02/22/2018   Lab Results  Component Value Date   CHOL 155 01/12/2018   Lab Results  Component Value Date   HDL 33 (L) 01/12/2018   Lab Results  Component Value Date   LDLCALC 89 01/12/2018   Lab Results  Component Value Date   TRIG 164 (H) 01/12/2018   Lab Results  Component Value Date   CHOLHDL 4.7 01/12/2018   Lab Results  Component Value Date   HGBA1C 7.5 (A) 01/12/2019      Assessment & Plan:  Diagnoses and all orders for this visit:  Type 2 diabetes mellitus without complication, without long-term current use of insulin (HCC) -     HgB A1c 8.5  -     Glucose (CBG) -     metFORMIN (GLUCOPHAGE XR) 500 MG 24 hr tablet; Take 2 tablets (1,000 mg total) by mouth daily with breakfast.  Hospital discharge follow-up Seen at [facility]- evaluated in the emergency room for respiratory distress.  Tested negative for COVID, x-rays did not indicate pulmonary embolism, with a intensive work-up there was no evidence of a heart attack or heart failure, or pneumonia.  Diagnosed COPD exacerbation.    Hypertension, unspecified type Blood pressure well controlled today reading 126/86 no medication change, continue dietary restrictions of sodium encourage vigorous exercising 30 minutes daily or 150 minutes weekly -     potassium chloride SA (KLOR-CON) 20 MEQ tablet; Take 1 tablet (20 mEq total) by mouth daily. -     hydrochlorothiazide (HYDRODIURIL) 25 MG tablet; Take 1 tablet (25 mg total) by mouth daily. Take on tablet in the morning. -      atorvastatin (LIPITOR) 40 MG tablet; Take 1 tablet (40 mg total) by mouth daily. -     aspirin 81 MG chewable tablet; Chew 1 tablet (81 mg total) by mouth daily at 6 (six) AM. -     amLODipine (NORVASC) 10 MG tablet; Take 1 tablet (10 mg total) by mouth daily.  Medication refill -     glimepiride (AMARYL) 2 MG tablet; Take 1 tablet (2 mg total) by mouth daily before breakfast. -     atorvastatin (LIPITOR) 40 MG tablet; Take 1 tablet (40 mg total) by mouth daily.  Bilateral lower extremity edema -     furosemide (LASIX) 40 MG tablet; Take 1 tablet (40 mg total) by mouth daily as needed.  Other orders -     Needles & Syringes MISC; 10 Units by Does not apply route 4 (four) times daily. 10 units of Lantus at bedtime -     Misc. Devices MISC; Auto bipap with Medium size [employer] Full Face Mask Simplus mask/ heated humidification. Autopap settings 15-20 cm H2O. -     insulin glargine (LANTUS) 100 UNIT/ML injection; Inject 0.1 mLs (10 Units total) into the skin at bedtime. -     budesonide-formoterol (SYMBICORT) 160-4.5 MCG/ACT inhaler; Inhale 2 puffs into the lungs 2 (two) times daily. -     albuterol (VENTOLIN HFA) 108 (90 Base) MCG/ACT inhaler; Inhale 2 puffs into the lungs every 4 (four) hours as needed for wheezing or shortness of breath.  Meds ordered this encounter  Medications  . metFORMIN (GLUCOPHAGE XR) 500 MG 24 hr tablet    Sig: Take 2 tablets (1,000 mg total) by mouth daily with breakfast.    Dispense:  60 tablet    Refill:  3  . potassium chloride SA (KLOR-CON) 20 MEQ tablet    Sig: Take 1 tablet (20 mEq total) by mouth daily.    Dispense:  30 tablet    Refill:  3  . Needles & Syringes MISC    Sig: 10 Units by Does not apply route 4 (four) times daily. 10 units of Lantus at bedtime    Dispense:  240 each    Refill:  0  . Misc. Devices MISC    Sig: Auto bipap with Medium size [employer] Full Face Mask Simplus mask/ heated humidification. Autopap settings 15-20  cm H2O.    Dispense:  1 each    Refill:  0  . insulin glargine (LANTUS) 100 UNIT/ML injection    Sig: Inject 0.1 mLs (10 Units total) into the skin at bedtime.    Dispense:  300 mL    Refill:  3  . hydrochlorothiazide (HYDRODIURIL) 25 MG tablet    Sig: Take 1 tablet (25 mg total) by mouth daily. Take on tablet in the morning.    Dispense:  90 tablet    Refill:  1  . glimepiride (AMARYL) 2 MG tablet    Sig: Take 1 tablet (2 mg total) by mouth daily before breakfast.    Dispense:  30 tablet    Refill:  3  . furosemide (LASIX) 40 MG tablet    Sig: Take 1 tablet (40 mg total) by mouth daily as needed.    Dispense:  60 tablet    Refill:  2  . budesonide-formoterol (SYMBICORT) 160-4.5 MCG/ACT inhaler    Sig: Inhale 2 puffs into the lungs 2 (two) times daily.    Dispense:  1 Inhaler    Refill:  3  . atorvastatin (LIPITOR) 40 MG tablet    Sig: Take 1 tablet (40 mg total) by mouth daily.    Dispense:  90 tablet    Refill:  1  . aspirin 81 MG chewable tablet    Sig: Chew 1 tablet (81 mg total) by mouth daily at 6 (six) AM.    Dispense:  90 tablet    Refill:  3  . amLODipine (NORVASC) 10 MG tablet    Sig: Take 1 tablet (10 mg total) by mouth daily.    Dispense:  90 tablet    Refill:  1  . albuterol (VENTOLIN HFA) 108 (90 Base) MCG/ACT inhaler    Sig: Inhale 2 puffs into the lungs every 4 (four) hours as needed for wheezing or shortness of breath.    Dispense:  6.7 g    Refill:  1    Follow-up: Return in about 3 months (around 04/14/2019) for DM, HTN.    Kerin Perna, NP

## 2019-01-12 NOTE — Patient Instructions (Signed)
Diabetes Mellitus and Exercise Exercising regularly is important for your overall health, especially when you have diabetes (diabetes mellitus). Exercising is not only about losing weight. It has many other health benefits, such as increasing muscle strength and bone density and reducing body fat and stress. This leads to improved fitness, flexibility, and endurance, all of which result in better overall health. Exercise has additional benefits for people with diabetes, including:  Reducing appetite.  Helping to lower and control blood glucose.  Lowering blood pressure.  Helping to control amounts of fatty substances (lipids) in the blood, such as cholesterol and triglycerides.  Helping the body to respond better to insulin (improving insulin sensitivity).  Reducing how much insulin the body needs.  Decreasing the risk for heart disease by: ? Lowering cholesterol and triglyceride levels. ? Increasing the levels of good cholesterol. ? Lowering blood glucose levels. What is my activity plan? Your health care provider or certified diabetes educator can help you make a plan for the type and frequency of exercise (activity plan) that works for you. Make sure that you:  Do at least 150 minutes of moderate-intensity or vigorous-intensity exercise each week. This could be brisk walking, biking, or water aerobics. ? Do stretching and strength exercises, such as yoga or weightlifting, at least 2 times a week. ? Spread out your activity over at least 3 days of the week.  Get some form of physical activity every day. ? Do not go more than 2 days in a row without some kind of physical activity. ? Avoid being inactive for more than 30 minutes at a time. Take frequent breaks to walk or stretch.  Choose a type of exercise or activity that you enjoy, and set realistic goals.  Start slowly, and gradually increase the intensity of your exercise over time. What do I need to know about managing my  diabetes?   Check your blood glucose before and after exercising. ? If your blood glucose is 240 mg/dL (13.3 mmol/L) or higher before you exercise, check your urine for ketones. If you have ketones in your urine, do not exercise until your blood glucose returns to normal. ? If your blood glucose is 100 mg/dL (5.6 mmol/L) or lower, eat a snack containing 15-20 grams of carbohydrate. Check your blood glucose 15 minutes after the snack to make sure that your level is above 100 mg/dL (5.6 mmol/L) before you start your exercise.  Know the symptoms of low blood glucose (hypoglycemia) and how to treat it. Your risk for hypoglycemia increases during and after exercise. Common symptoms of hypoglycemia can include: ? Hunger. ? Anxiety. ? Sweating and feeling clammy. ? Confusion. ? Dizziness or feeling light-headed. ? Increased heart rate or palpitations. ? Blurry vision. ? Tingling or numbness around the mouth, lips, or tongue. ? Tremors or shakes. ? Irritability.  Keep a rapid-acting carbohydrate snack available before, during, and after exercise to help prevent or treat hypoglycemia.  Avoid injecting insulin into areas of the body that are going to be exercised. For example, avoid injecting insulin into: ? The arms, when playing tennis. ? The legs, when jogging.  Keep records of your exercise habits. Doing this can help you and your health care provider adjust your diabetes management plan as needed. Write down: ? Food that you eat before and after you exercise. ? Blood glucose levels before and after you exercise. ? The type and amount of exercise you have done. ? When your insulin is expected to peak, if you use   insulin. Avoid exercising at times when your insulin is peaking.  When you start a new exercise or activity, work with your health care provider to make sure the activity is safe for you, and to adjust your insulin, medicines, or food intake as needed.  Drink plenty of water while  you exercise to prevent dehydration or heat stroke. Drink enough fluid to keep your urine clear or pale yellow. Summary  Exercising regularly is important for your overall health, especially when you have diabetes (diabetes mellitus).  Exercising has many health benefits, such as increasing muscle strength and bone density and reducing body fat and stress.  Your health care provider or certified diabetes educator can help you make a plan for the type and frequency of exercise (activity plan) that works for you.  When you start a new exercise or activity, work with your health care provider to make sure the activity is safe for you, and to adjust your insulin, medicines, or food intake as needed. This information is not intended to replace advice given to you by your health care provider. Make sure you discuss any questions you have with your health care provider. Document Released: 05/10/2003 Document Revised: 09/11/2016 Document Reviewed: 07/30/2015 Elsevier Patient Education  2020 Elsevier Inc.  

## 2019-04-07 ENCOUNTER — Other Ambulatory Visit (INDEPENDENT_AMBULATORY_CARE_PROVIDER_SITE_OTHER): Payer: Self-pay | Admitting: Primary Care

## 2019-04-14 ENCOUNTER — Ambulatory Visit (INDEPENDENT_AMBULATORY_CARE_PROVIDER_SITE_OTHER): Payer: Medicaid Other | Admitting: Primary Care

## 2019-04-25 IMAGING — DX DG CHEST 1V PORT
1 series · 1 of 1 positions shown · non-contrast
Comparison: None.

CLINICAL DATA: Syncopal episode. History of prostate cancer and
sleep apnea.

EXAM:
PORTABLE CHEST 1 VIEW

[chest ap]
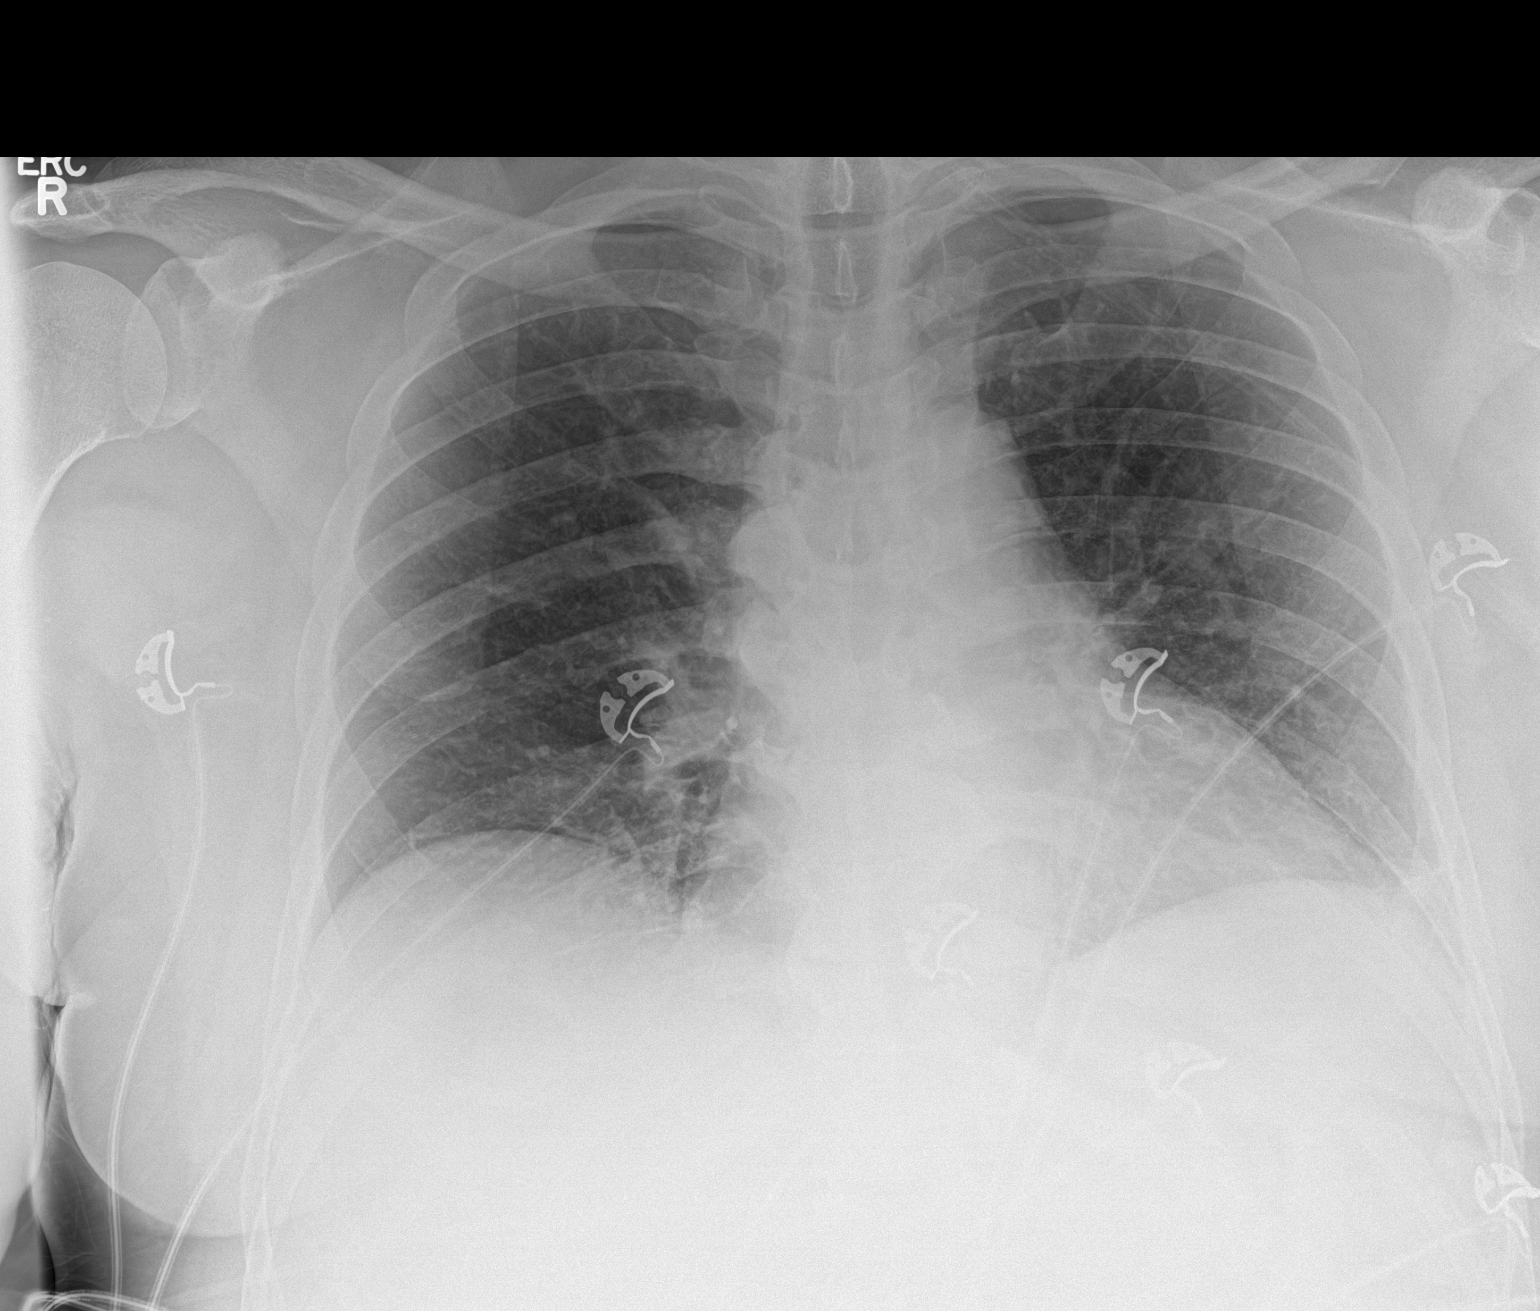

[1 of 1 positions shown; findings below may reference images not displayed]

FINDINGS: Cardiac silhouette is mildly enlarged. Pulmonary vascular
congestion, low inspiratory examination with crowded vascular
markings. Strandy densities LEFT lung base. No pneumothorax. Soft
tissue planes and included osseous structures are non suspicious.
IMPRESSION: Mild cardiomegaly and pulmonary vascular congestion. LEFT lung base
atelectasis/scarring.

## 2019-04-27 ENCOUNTER — Ambulatory Visit (INDEPENDENT_AMBULATORY_CARE_PROVIDER_SITE_OTHER): Payer: Medicaid Other | Admitting: Primary Care

## 2020-03-12 ENCOUNTER — Telehealth: Payer: Self-pay | Admitting: Pharmacy Technician

## 2020-03-12 NOTE — Telephone Encounter (Signed)
Patient failed to provide 2021 proof of income.  No additional medication assistance will be provided by [facility] without the required proof of income documentation.  Patient notified by letter.  Dwan Hemmelgarn J. Heyli Min Care Manager [facility]   [address] [place], [place]  [address]     This is to inform you that you are no longer eligible to receive medication assistance at [facility].  The reason(s) are:    _____Your total gross monthly household income exceeds 250% of the Federal Poverty Level.   _____Tangible assets (savings, checking, stocks/bonds, pension, retirement, etc.) exceeds our limit  _____You are eligible to receive benefits from Medicaid, [facility] or HIV Medication              Assistance Program _____You are eligible to receive benefits from a Medicare Part "D" plan _____You have prescription insurance  _____You are not an [place] resident __X__Failure to provide all requested proof of income information for 2021.    Medication assistance will resume once all requested financial information has been returned to our clinic.  If you have questions, please contact our clinic at [phone].    Thank you,  [facility] 

## 2021-10-18 ENCOUNTER — Encounter: Payer: Self-pay | Admitting: Gastroenterology

## 2021-10-27 ENCOUNTER — Emergency Department (HOSPITAL_COMMUNITY): Payer: Self-pay

## 2021-10-27 ENCOUNTER — Other Ambulatory Visit: Payer: Self-pay

## 2021-10-27 ENCOUNTER — Inpatient Hospital Stay (HOSPITAL_COMMUNITY)
Admission: EM | Admit: 2021-10-27 | Discharge: 2021-10-29 | DRG: 247 | Disposition: A | Discharge status: Home / Self Care | Payer: Self-pay | Attending: Internal Medicine | Admitting: Internal Medicine

## 2021-10-27 ENCOUNTER — Encounter (HOSPITAL_COMMUNITY): Payer: Self-pay

## 2021-10-27 DIAGNOSIS — F1990 Other psychoactive substance use, unspecified, uncomplicated: Secondary | ICD-10-CM

## 2021-10-27 DIAGNOSIS — F1010 Alcohol abuse, uncomplicated: Secondary | ICD-10-CM | POA: Diagnosis present

## 2021-10-27 DIAGNOSIS — Z833 Family history of diabetes mellitus: Secondary | ICD-10-CM

## 2021-10-27 DIAGNOSIS — Z7951 Long term (current) use of inhaled steroids: Secondary | ICD-10-CM

## 2021-10-27 DIAGNOSIS — R778 Other specified abnormalities of plasma proteins: Secondary | ICD-10-CM

## 2021-10-27 DIAGNOSIS — Z8249 Family history of ischemic heart disease and other diseases of the circulatory system: Secondary | ICD-10-CM

## 2021-10-27 DIAGNOSIS — R569 Unspecified convulsions: Secondary | ICD-10-CM | POA: Diagnosis present

## 2021-10-27 DIAGNOSIS — G4733 Obstructive sleep apnea (adult) (pediatric): Secondary | ICD-10-CM | POA: Diagnosis present

## 2021-10-27 DIAGNOSIS — E7800 Pure hypercholesterolemia, unspecified: Secondary | ICD-10-CM | POA: Diagnosis present

## 2021-10-27 DIAGNOSIS — I10 Essential (primary) hypertension: Secondary | ICD-10-CM | POA: Diagnosis present

## 2021-10-27 DIAGNOSIS — K219 Gastro-esophageal reflux disease without esophagitis: Secondary | ICD-10-CM | POA: Diagnosis present

## 2021-10-27 DIAGNOSIS — Z794 Long term (current) use of insulin: Secondary | ICD-10-CM

## 2021-10-27 DIAGNOSIS — Z6838 Body mass index (BMI) 38.0-38.9, adult: Secondary | ICD-10-CM

## 2021-10-27 DIAGNOSIS — Z79899 Other long term (current) drug therapy: Secondary | ICD-10-CM

## 2021-10-27 DIAGNOSIS — Z8546 Personal history of malignant neoplasm of prostate: Secondary | ICD-10-CM

## 2021-10-27 DIAGNOSIS — Z7984 Long term (current) use of oral hypoglycemic drugs: Secondary | ICD-10-CM

## 2021-10-27 DIAGNOSIS — I252 Old myocardial infarction: Secondary | ICD-10-CM

## 2021-10-27 DIAGNOSIS — Z7982 Long term (current) use of aspirin: Secondary | ICD-10-CM

## 2021-10-27 DIAGNOSIS — E876 Hypokalemia: Secondary | ICD-10-CM | POA: Diagnosis present

## 2021-10-27 DIAGNOSIS — I214 Non-ST elevation (NSTEMI) myocardial infarction: Principal | ICD-10-CM | POA: Diagnosis present

## 2021-10-27 DIAGNOSIS — I25110 Atherosclerotic heart disease of native coronary artery with unstable angina pectoris: Secondary | ICD-10-CM | POA: Diagnosis present

## 2021-10-27 DIAGNOSIS — F1410 Cocaine abuse, uncomplicated: Secondary | ICD-10-CM | POA: Diagnosis present

## 2021-10-27 DIAGNOSIS — F1910 Other psychoactive substance abuse, uncomplicated: Secondary | ICD-10-CM

## 2021-10-27 DIAGNOSIS — R079 Chest pain, unspecified: Secondary | ICD-10-CM | POA: Diagnosis present

## 2021-10-27 DIAGNOSIS — E119 Type 2 diabetes mellitus without complications: Secondary | ICD-10-CM

## 2021-10-27 DIAGNOSIS — Z880 Allergy status to penicillin: Secondary | ICD-10-CM

## 2021-10-27 DIAGNOSIS — E1165 Type 2 diabetes mellitus with hyperglycemia: Secondary | ICD-10-CM | POA: Diagnosis present

## 2021-10-27 DIAGNOSIS — Z8547 Personal history of malignant neoplasm of testis: Secondary | ICD-10-CM

## 2021-10-27 DIAGNOSIS — Z87891 Personal history of nicotine dependence: Secondary | ICD-10-CM

## 2021-10-27 HISTORY — DX: Chest pain, unspecified: R07.9

## 2021-10-27 HISTORY — DX: Other psychoactive substance abuse, uncomplicated: F19.10

## 2021-10-27 LAB — BASIC METABOLIC PANEL
Anion gap: 10 (ref 5–15)
BUN: 11 mg/dL (ref 6–20)
CO2: 24 mmol/L (ref 22–32)
Calcium: 8.5 mg/dL — ABNORMAL LOW (ref 8.9–10.3)
Chloride: 103 mmol/L (ref 98–111)
Creatinine, Ser: 1.09 mg/dL (ref 0.61–1.24)
GFR, Estimated: >60 mL/min (ref >60)
Glucose, Bld: 152 mg/dL — ABNORMAL HIGH (ref 70–99)
Potassium: 3.6 mmol/L (ref 3.5–5.1)
Sodium: 137 mmol/L (ref 135–145)

## 2021-10-27 LAB — RAPID URINE DRUG SCREEN, HOSP PERFORMED
Amphetamines: NOT DETECTED
Barbiturates: NOT DETECTED
Benzodiazepines: NOT DETECTED
Cocaine: POSITIVE — AB
Opiates: NOT DETECTED
Tetrahydrocannabinol: NOT DETECTED

## 2021-10-27 LAB — HEPATIC FUNCTION PANEL
ALT: 22 U/L (ref 0–44)
AST: 23 U/L (ref 15–41)
Albumin: 3.6 g/dL (ref 3.5–5.0)
Alkaline Phosphatase: 67 U/L (ref 38–126)
Bilirubin, Direct: 0.1 mg/dL (ref 0.0–0.2)
Indirect Bilirubin: 0.4 mg/dL (ref 0.3–0.9)
Total Bilirubin: 0.5 mg/dL (ref 0.3–1.2)
Total Protein: 6.9 g/dL (ref 6.5–8.1)

## 2021-10-27 LAB — CBC
HCT: 46.1 % (ref 39.0–52.0)
Hemoglobin: 15.7 g/dL (ref 13.0–17.0)
MCH: 31 pg (ref 26.0–34.0)
MCHC: 34.1 g/dL (ref 30.0–36.0)
MCV: 90.9 fL (ref 80.0–100.0)
Platelets: 179 10*3/uL (ref 150–400)
RBC: 5.07 MIL/uL (ref 4.22–5.81)
RDW: 13.4 % (ref 11.5–15.5)
WBC: 7.6 10*3/uL (ref 4.0–10.5)
nRBC: 0 % (ref 0.0–0.2)

## 2021-10-27 LAB — LIPASE, BLOOD: Lipase: 25 U/L (ref 11–51)

## 2021-10-27 LAB — HEMOGLOBIN A1C
Hgb A1c MFr Bld: 6.7 % — ABNORMAL HIGH (ref 4.8–5.6)
Mean Plasma Glucose: 145.59 mg/dL

## 2021-10-27 LAB — CBG MONITORING, ED: Glucose-Capillary: 103 mg/dL — ABNORMAL HIGH (ref 70–99)

## 2021-10-27 LAB — PROTIME-INR
INR: 1.2 (ref 0.8–1.2)
Prothrombin Time: 15.4 seconds — ABNORMAL HIGH (ref 11.4–15.2)

## 2021-10-27 LAB — TROPONIN I (HIGH SENSITIVITY)
Troponin I (High Sensitivity): 116 ng/L (ref <18)
Troponin I (High Sensitivity): 105 ng/L (ref <18)

## 2021-10-27 LAB — HEPARIN LEVEL (UNFRACTIONATED): Heparin Unfractionated: <0.1 IU/mL — ABNORMAL LOW (ref 0.30–0.70)

## 2021-10-27 LAB — ETHANOL: Alcohol, Ethyl (B): 151 mg/dL — ABNORMAL HIGH (ref <10)

## 2021-10-27 LAB — GLUCOSE, CAPILLARY: Glucose-Capillary: 155 mg/dL — ABNORMAL HIGH (ref 70–99)

## 2021-10-27 MED ORDER — ENOXAPARIN SODIUM 40 MG/0.4ML IJ SOSY: 40.0000 mg | PREFILLED_SYRINGE | INTRAMUSCULAR | Status: DC

## 2021-10-27 MED ORDER — LOSARTAN POTASSIUM 50 MG PO TABS
50.0000 mg | ORAL_TABLET | Freq: Every day | ORAL | Status: DC
  Administered 2021-10-27 – 2021-10-28 (×2): 50 mg via ORAL
  Filled 2021-10-27 (×2): qty 1

## 2021-10-27 MED ORDER — SPIRONOLACTONE 25 MG PO TABS
25.0000 mg | ORAL_TABLET | Freq: Every day | ORAL | Status: DC
  Administered 2021-10-27 – 2021-10-29 (×3): 25 mg via ORAL
  Filled 2021-10-27 (×3): qty 1

## 2021-10-27 MED ORDER — ATORVASTATIN CALCIUM 40 MG PO TABS
40.0000 mg | ORAL_TABLET | Freq: Every day | ORAL | Status: DC
Start: 2021-10-28 — End: 2021-10-29
  Administered 2021-10-28 – 2021-10-29 (×2): 40 mg via ORAL
  Filled 2021-10-27 (×2): qty 1

## 2021-10-27 MED ORDER — SODIUM CHLORIDE 0.9 % IV SOLN: 250.0000 mL | INTRAVENOUS | Status: DC | PRN

## 2021-10-27 MED ORDER — INSULIN ASPART 100 UNIT/ML IJ SOLN
0.0000 [IU] | Freq: Three times a day (TID) | INTRAMUSCULAR | Status: DC
  Administered 2021-10-28: 2 [IU] via SUBCUTANEOUS
  Administered 2021-10-28: 5 [IU] via SUBCUTANEOUS
  Administered 2021-10-29 (×2): 3 [IU] via SUBCUTANEOUS

## 2021-10-27 MED ORDER — FUROSEMIDE 40 MG PO TABS
40.0000 mg | ORAL_TABLET | Freq: Every day | ORAL | Status: DC
Start: 2021-10-28 — End: 2021-10-29
  Administered 2021-10-28 – 2021-10-29 (×2): 40 mg via ORAL
  Filled 2021-10-27 (×2): qty 1

## 2021-10-27 MED ORDER — ONDANSETRON HCL 4 MG/2ML IJ SOLN: 4.0000 mg | Freq: Four times a day (QID) | INTRAMUSCULAR | Status: DC | PRN

## 2021-10-27 MED ORDER — HEPARIN BOLUS VIA INFUSION
2000.0000 [IU] | Freq: Once | INTRAVENOUS | Status: AC
  Administered 2021-10-28: 2000 [IU] via INTRAVENOUS
  Filled 2021-10-27: qty 2000

## 2021-10-27 MED ORDER — ASPIRIN 81 MG PO CHEW: 324.0000 mg | CHEWABLE_TABLET | Freq: Once | ORAL | Status: DC

## 2021-10-27 MED ORDER — SODIUM CHLORIDE 0.9% FLUSH: 3.0000 mL | INTRAVENOUS | Status: DC | PRN

## 2021-10-27 MED ORDER — SODIUM CHLORIDE 0.9% FLUSH
3.0000 mL | Freq: Two times a day (BID) | INTRAVENOUS | Status: DC
  Administered 2021-10-27 – 2021-10-29 (×3): 3 mL via INTRAVENOUS

## 2021-10-27 MED ORDER — NITROGLYCERIN 0.4 MG SL SUBL
0.4000 mg | SUBLINGUAL_TABLET | SUBLINGUAL | Status: DC | PRN
Start: 2021-10-27 — End: 2021-10-29
  Administered 2021-10-28 – 2021-10-29 (×4): 0.4 mg via SUBLINGUAL
  Filled 2021-10-27 (×3): qty 1

## 2021-10-27 MED ORDER — ACETAMINOPHEN 325 MG PO TABS
650.0000 mg | ORAL_TABLET | ORAL | Status: DC | PRN
Start: 2021-10-27 — End: 2021-10-28

## 2021-10-27 MED ORDER — HEPARIN (PORCINE) 25000 UT/250ML-% IV SOLN
1850.0000 [IU]/h | INTRAVENOUS | Status: DC
  Administered 2021-10-27: 1300 [IU]/h via INTRAVENOUS
  Administered 2021-10-28: 1650 [IU]/h via INTRAVENOUS
  Filled 2021-10-27 (×2): qty 250

## 2021-10-27 MED ORDER — LEVETIRACETAM 750 MG PO TABS
750.0000 mg | ORAL_TABLET | Freq: Two times a day (BID) | ORAL | Status: DC
  Administered 2021-10-27 – 2021-10-29 (×4): 750 mg via ORAL
  Filled 2021-10-27 (×6): qty 1

## 2021-10-27 MED ORDER — ASPIRIN 81 MG PO CHEW
81.0000 mg | CHEWABLE_TABLET | Freq: Every day | ORAL | Status: DC
  Administered 2021-10-28 – 2021-10-29 (×2): 81 mg via ORAL
  Filled 2021-10-27 (×2): qty 1

## 2021-10-27 MED ORDER — HEPARIN BOLUS VIA INFUSION
4000.0000 [IU] | Freq: Once | INTRAVENOUS | Status: AC
  Administered 2021-10-27: 4000 [IU] via INTRAVENOUS
  Filled 2021-10-27: qty 4000

## 2021-10-27 MED ORDER — AMLODIPINE BESYLATE 10 MG PO TABS
10.0000 mg | ORAL_TABLET | Freq: Every day | ORAL | Status: DC
  Administered 2021-10-27 – 2021-10-29 (×3): 10 mg via ORAL
  Filled 2021-10-27: qty 2
  Filled 2021-10-27 (×2): qty 1

## 2021-10-27 MED ORDER — INSULIN ASPART 100 UNIT/ML IJ SOLN: 0.0000 [IU] | Freq: Every day | INTRAMUSCULAR | Status: DC

## 2021-10-27 NOTE — Hospital Course (Signed)
This is a 56 year old male with a past medical history of diabetes mellitus, hypertension, hyperlipidemia, GERD, OSA, seizures, and testicular cancer who presented with acute chest pain.  Patient was found to have elevated troponin initially of 116 (08/27) and on repeat it was 105.  Patient was admitted for NSTEMI.  #NSTEMI status post stenting -Initial troponin on 10/27/2021 elevated at 116 and again at 105.  -10/27/2021 echo showing normal ejection fraction of 55 to 60%. - Patient had left and right heart cath on 10/28/2021 that showed Proximal 95% stenosis of RCA requiring DES and 60%-80% stenosis of the Mid OM1 requiring DES.  -Patient was initially started on heparin drip, prior to and after, patient on aspirin, and Brilinta.  Patient also was started on atorvastatin initially, but was switched to rosuvastatin 40 mg per cardiology -Lasix given and during admission, patient's fluid status remained stable and patient is currently anemia -On discharge day, there was an incident where the patient had some Chest pain which woke him up out of his sleep, and the cardiologist was made aware of this. Trop was 4200, however this was anticipated by the cardiologist given that there may have been a thrombus that went distally to the inferior portion of the circumflex. Cardiology deemed it safe to follow up outpatient.  [facility] patient discharged on aspirin 81 mg daily, Brilinta 90 mg twice daily, metoprolol succinate 25 mg daily  #Hypertension #Hyperlipidemia -Blood pressure was elevated on admission with initial BP of 183/112 -Patient was initially on amlodipine 10 mg daily, losartan 50 mg daily, and spironolactone 25 mg daily.  Patient was also on atorvastatin 40 mg daily -Cardiology recommended switching patient to rosuvastatin 40 mg daily and increasing losartan to 100 milligrams daily. -Blood pressure was stable during admission, and patient will be discharged on amlodipine 10 mg daily, losartan 100  mg daily, and spironolactone 25 mg daily, and rosuvastatin 40 mg daily -Patient to continue to follow-up outpatient for blood pressure control  #Diabetes mellitus, Type II -Patient initial A1c 6.8 -Patient was started on sliding scale insulin on admission, and home metformin was held -Glucose remained stable and was not too elevated during admission ranging from 103-190 -Patient to restart home metformin of 1000 mg BID outpatient and insulin will be stopped. -further management outpatient   #Alcohol use disorder #Cocaine use -08/29 Drug screen positive for cocaine and ethanol level 151. Patient instructed to stay away from both alcohol and cocaine.  -Patient was initially put on CIWA given that the patient drinks about 12 pack of beer a day.  Patient did not have any withdrawal symptoms at hospital.  #Hypokalemia -Patient had a mildly low potassium at 3.2. It was supplemented prior to discharge   #Seizures -Patient has history of seizures and takes Keppra 750 mg twice daily.   -Patient continues to receive Keppra in hospital, no seizure activity during hospital stay -Patient will be discharged on home Keppra 750 mg twice daily

## 2021-10-27 NOTE — ED Provider Notes (Signed)
[facility] EMERGENCY DEPARTMENT Provider Note   CSN: [id number] Arrival date & time: 10/27/21  0908     History  Chief Complaint  Patient presents with   Chest Pain    Dillon Wang is a 56 y.o. male.  Patient here with chest pain since last night.  Got better with nitroglycerin this morning with EMS.  Admits alcohol use overnight.  History of cocaine abuse but states has not used any since Thursday.  Has a history of high cholesterol, hypertension, diabetes.  Has not been having chest pain prior to this.  Denies any fevers or chills.  No cough, no weakness, numbness.  Denies any trauma.  Nothing makes it worse or better.  The history is provided by the patient.       Home Medications Prior to Admission medications   Medication Sig Start Date End Date Taking? Authorizing Provider  amLODipine (NORVASC) 10 MG tablet Take 1 tablet (10 mg total) by mouth daily. 01/12/19   Kerin Perna, NP  aspirin 81 MG chewable tablet Chew 1 tablet (81 mg total) by mouth daily at 6 (six) AM. 01/12/19   Kerin Perna, NP  atorvastatin (LIPITOR) 40 MG tablet Take 1 tablet (40 mg total) by mouth daily. 01/12/19   Kerin Perna, NP  budesonide-formoterol (SYMBICORT) 160-4.5 MCG/ACT inhaler Inhale 2 puffs into the lungs 2 (two) times daily. 01/12/19   Kerin Perna, NP  furosemide (LASIX) 40 MG tablet Take 1 tablet (40 mg total) by mouth daily as needed. 01/12/19   Kerin Perna, NP  glimepiride (AMARYL) 2 MG tablet Take 1 tablet (2 mg total) by mouth daily before breakfast. 01/12/19   Kerin Perna, NP  hydrochlorothiazide (HYDRODIURIL) 25 MG tablet Take 1 tablet (25 mg total) by mouth daily. Take on tablet in the morning. 01/12/19   Kerin Perna, NP  insulin glargine (LANTUS) 100 UNIT/ML injection Inject 0.1 mLs (10 Units total) into the skin at bedtime. 01/12/19   Kerin Perna, NP  levETIRAcetam (KEPPRA) 750 MG tablet Take 1 tablet  (750 mg total) by mouth 2 (two) times daily. 06/02/18 08/01/18  Kerin Perna, NP  metFORMIN (GLUCOPHAGE XR) 500 MG 24 hr tablet Take 2 tablets (1,000 mg total) by mouth daily with breakfast. 01/12/19   Kerin Perna, NP  Misc. Devices MISC Auto bipap with Medium size [employer] Full Face Mask Simplus mask/ heated humidification. Autopap settings 15-20 cm H2O. 01/12/19   Kerin Perna, NP  Needles & Syringes MISC 10 Units by Does not apply route 4 (four) times daily. 10 units of Lantus at bedtime 01/12/19   Kerin Perna, NP  potassium chloride SA (KLOR-CON) 20 MEQ tablet Take 1 tablet (20 mEq total) by mouth daily. 01/12/19   Kerin Perna, NP  VENTOLIN HFA 108 (90 Base) MCG/ACT inhaler INHALE 2 PUFFS INTO THE LUNGS EVERY 4 HOURS AS NEEDED FOR WHEEZING ORSHORTNESS OF BREATH 04/07/19   Kerin Perna, NP      Allergies    Penicillins    Review of Systems   Review of Systems  Physical Exam Updated Vital Signs BP 135/81   Pulse 70   Temp 97.6 F (36.4 C) (Oral)   Resp (!) 23   SpO2 96%  Physical Exam Vitals and nursing note reviewed.  Constitutional:      General: He is not in acute distress.    Appearance: He is well-developed. He is not ill-appearing.  HENT:  Head: Normocephalic and atraumatic.  Eyes:     Extraocular Movements: Extraocular movements intact.     Conjunctiva/sclera: Conjunctivae normal.     Pupils: Pupils are equal, round, and reactive to light.  Cardiovascular:     Rate and Rhythm: Normal rate and regular rhythm.     Pulses:          Radial pulses are 2+ on the right side and 2+ on the left side.     Heart sounds: No murmur heard. Pulmonary:     Effort: Pulmonary effort is normal. No respiratory distress.     Breath sounds: Normal breath sounds.  Abdominal:     Palpations: Abdomen is soft.     Tenderness: There is no abdominal tenderness.  Musculoskeletal:        General: No swelling.     Cervical back: Normal range of  motion and neck supple.  Skin:    General: Skin is warm and dry.     Capillary Refill: Capillary refill takes less than 2 seconds.  Neurological:     General: No focal deficit present.     Mental Status: He is alert.     Cranial Nerves: No cranial nerve deficit.     Motor: No weakness.     Comments: Patient is sleepy but 5+ out of 5 strength throughout, normal sensation, no drift  Psychiatric:        Mood and Affect: Mood normal.     ED Results / Procedures / Treatments   Labs (all labs ordered are listed, but only abnormal results are displayed) Labs Reviewed  BASIC METABOLIC PANEL - Abnormal; Notable for the following components:      Result Value   Glucose, Bld 152 (*)    Calcium 8.5 (*)    All other components within normal limits  ETHANOL - Abnormal; Notable for the following components:   Alcohol, Ethyl (B) 151 (*)    All other components within normal limits  TROPONIN I (HIGH SENSITIVITY) - Abnormal; Notable for the following components:   Troponin I (High Sensitivity) 116 (*)    All other components within normal limits  TROPONIN I (HIGH SENSITIVITY) - Abnormal; Notable for the following components:   Troponin I (High Sensitivity) 105 (*)    All other components within normal limits  CBC  LIPASE, BLOOD  HEPATIC FUNCTION PANEL  RAPID URINE DRUG SCREEN, HOSP PERFORMED    EKG EKG Interpretation  Date/Time:  Sunday October 27 2021 10:44:35 EDT Ventricular Rate:  69 PR Interval:  141 QRS Duration: 92 QT Interval:  406 QTC Calculation: 435 R Axis:   41 Text Interpretation: Sinus rhythm Abnormal T, consider ischemia, lateral leads Minimal ST elevation, anterior leads Confirmed by Lennice Sites ([id number]) on 10/27/2021 11:05:30 AM  Radiology CT HEAD WO CONTRAST (5MM)  Result Date: 10/27/2021 CLINICAL DATA:  Mental status change, unknown cause EXAM: CT HEAD WITHOUT CONTRAST TECHNIQUE: Contiguous axial images were obtained from the base of the skull through the vertex  without intravenous contrast. RADIATION DOSE REDUCTION: This exam was performed according to the departmental dose-optimization program which includes automated exposure control, adjustment of the mA and/or kV according to patient size and/or use of iterative reconstruction technique. COMPARISON:  Brain MRI 02/21/2018 FINDINGS: Brain: No evidence of acute infarction, hemorrhage, hydrocephalus, extra-axial collection or mass lesion/mass effect. Vascular: No hyperdense vessel or unexpected calcification. Skull: Normal. Negative for fracture or focal lesion. Sinuses/Orbits: Mucosal thickening in the right frontal sinus and ethmoid air cells. Probable polyp  or retention cyst in left maxillary sinus. Mild sinus disease in the right maxillary sinus. Other: None IMPRESSION: 1. No acute intracranial abnormality. 2. Mild paranasal sinus disease. Electronically Signed   By: Markus Daft M.D.   On: 10/27/2021 12:13   DG Chest 2 View  Result Date: 10/27/2021 CLINICAL DATA:  Heartburn. EXAM: CHEST - 2 VIEW COMPARISON:  None Available. FINDINGS: Cardiomegaly. The hila and mediastinum are unremarkable. Mild haziness in the bases is likely atelectasis. No overt edema. No pneumothorax. No other acute abnormalities. IMPRESSION: Cardiomegaly without infiltrate or edema. Probable bibasilar subsegmental atelectasis. Electronically Signed   By: Dorise Bullion III M.D.   On: 10/27/2021 11:12    Procedures Procedures    Medications Ordered in ED Medications - No data to display  ED Course/ Medical Decision Making/ A&P                           Medical Decision Making Amount and/or Complexity of Data Reviewed Labs: ordered. Radiology: ordered.  Risk Decision regarding hospitalization.   [place] Want is here with chest pain.  History of alcohol abuse, cocaine abuse, hypertension, high cholesterol, diabetes.  Vital signs overall unremarkable.  Chest pain since last night that got better with nitroglycerin this morning  with EMS.  He thought he was having heartburn symptoms.  He admits alcohol use overnight and not much sleep.  He denies any trauma.  Has not used cocaine since Thursday.  EKG shows sinus rhythm.  No ischemic changes.  Some LVH changes anteriorly which appear consistent with prior EKGs.  I discussed EKG on the phone with Dr. Harl Bowie of cardiology who agreed that there did not appear to be any signs of STEMI equivalent.  Differential diagnosis includes ACS, pneumonia, vasospasm in setting of may be cocaine use, electrolyte abnormality, anemia.  He denies any black or bloody stools.  He has no abdominal tenderness.  Have no concern for pancreatitis or intra-abdominal process at this time.  We will get CBC, CMP, troponin, chest x-ray.  He is fairly sleepy and will get a head CT as he does admit to alcohol use and will rule out any intracranial injury  Per my review and interpretation of labs is no significant anemia, electrolyte abnormality, kidney injury.  However troponin is elevated at 116.  I will discuss with cardiology and will trend troponin at this time.  This could be in the setting of may be cocaine use, could be ACS/NSTEMI.  Repeat troponin 105.  Alcohol level 151.  Talk to Dr. Rae Lips with cardiology recommends medicine admission for further ACS rule out.  He does have risk factors.  Troponin has leveled.  This could be vasospasm in the setting of cocaine but they will further rule out ACS.  Can hold heparin at this time.  Patient is chest pain-free.  Head CT without any acute findings.  Admitted to medicine.  This chart was dictated using voice recognition software.  Despite best efforts to proofread,  errors can occur which can change the documentation meaning.         Final Clinical Impression(s) / ED Diagnoses Final diagnoses:  Chest pain, unspecified type  Elevated troponin  Polysubstance abuse [facility])    Rx / DC Orders ED Discharge Orders     None         Lennice Sites,  DO 10/27/21 1227

## 2021-10-27 NOTE — Progress Notes (Signed)
ANTICOAGULATION CONSULT NOTE - Initial Consult  Pharmacy Consult for Heparin Indication: chest pain/ACS  Allergies  Allergen Reactions   Penicillins Anaphylaxis    Patient Measurements:   Heparin Dosing Weight: 95 kg  Vital Signs: Temp: 97.6 F (36.4 C) (08/27 1048) Temp Source: Oral (08/27 1048) BP: 158/113 (08/27 1415) Pulse Rate: 77 (08/27 1415)  Labs: Recent Labs    10/27/21 0925 10/27/21 1056  HGB 15.7  --   HCT 46.1  --   PLT 179  --   CREATININE 1.09  --   TROPONINIHS 116* 105*    CrCl cannot be calculated (Unknown ideal weight.).   Medical History: Past Medical History:  Diagnosis Date   Cancer [facility])    prostate   Diabetes mellitus without complication ([place])    GERD (gastroesophageal reflux disease)    Hyperlipidemia    Hypertension    Obstructive sleep apnea 09/20/2017   Seizures ([place])    Sleep apnea    Tussive syncope 08/05/2018    Medications:  (Not in a hospital admission)  Scheduled:   amLODipine  10 mg Oral Daily   [START ON 10/28/2021] aspirin  81 mg Oral Q0600   [START ON 10/28/2021] atorvastatin  40 mg Oral Daily   enoxaparin (LOVENOX) injection  40 mg Subcutaneous Q24H   [START ON 10/28/2021] furosemide  40 mg Oral Daily   insulin aspart  0-15 Units Subcutaneous TID WC   levETIRAcetam  750 mg Oral BID   losartan  50 mg Oral Daily   sodium chloride flush  3 mL Intravenous Q12H   spironolactone  25 mg Oral Daily   Infusions:   sodium chloride     PRN: sodium chloride, acetaminophen, nitroGLYCERIN, ondansetron (ZOFRAN) IV, sodium chloride flush  Assessment: 33 yom with a history of HTN, HLD, T2DM, obesity, OSA, smoker, polysubstance abuse, H/o prostate cancer, seizures. Patient is presenting with chest pain. Heparin per pharmacy consult placed for chest pain/ACS.  Patient is not on anticoagulation prior to arrival.  Hgb 15.7; plt 179  Goal of Therapy:  Heparin level 0.3-0.7 units/ml Monitor platelets by anticoagulation protocol:  Yes   Plan:  Give IV heparin 4000 units bolus x 1 Start heparin infusion at 1300 units/hr Check anti-Xa level at 2200 and daily while on heparin Continue to monitor H&H and platelets  Lorelei Pont, PharmD, BCPS 10/27/2021 3:17 PM ED Clinical Pharmacist -  [phone]

## 2021-10-27 NOTE — Consult Note (Addendum)
CARDIOLOGY CONSULT NOTE  Patient ID: Cassandra Mcmanaman MRN: [id number] DOB/AGE: 1965-08-24 56 y.o.  Admit date: 10/27/2021 Referring Physician: [facility] Reason for Consultation:  Chest pain  HPI:   57 y.o. African American male  with hypertension, hyperlipidemia, type 2 DM, obesity, OSA, polysubstance abuse-cigarettes 15 PY, EtoH 6 pack/day, cocaine, h/o prostate cancer, h/o seizures, admitted with chest pain  Patient lives with his parents, is uninsured, non-compliant with his mediations, does not have a PCP. He finished drinking his 6 pack at 12:30 AM, also had cocaine. At 2:30 AM, he started having excruciating retrosternal chest pain, that has resolved since he took 4 Aspirin and nitroglycerin. Currently, he is chest pain free. On further questioning, he reports exertional dyspnea after walking 15-20 steps, 1 pillow orthopnea, bilateral leg edema for close to two years.   Workup in the ER showed nonspecific lateral TWI on EKG, Trop HS 116-->106, ethaol level 152 (normal <15), chest Xray with Cardiomegaly without infiltrate or edema, probable bibasilar subsegmental atelectasis.  Past Medical History:  Diagnosis Date   Cancer [facility])    prostate   Diabetes mellitus without complication (HCC)    GERD (gastroesophageal reflux disease)    Hyperlipidemia    Hypertension    Obstructive sleep apnea 09/20/2017   Seizures ([place])    Sleep apnea    Tussive syncope 08/05/2018     Past Surgical History:  Procedure Laterality Date   TESTICLE REMOVAL Left       Family History  Problem Relation Age of Onset   Hypertension Mother    Diabetes Maternal Aunt    Seizures Father    Seizures Brother    Colon cancer Neg Hx    Esophageal cancer Neg Hx    Rectal cancer Neg Hx    Stomach cancer Neg Hx      Social History: Social History   Socioeconomic History   Marital status: Single    Spouse name: Not on file   Number of children: Not on file   Years of education: Not on file   Highest  education level: Not on file  Occupational History   Not on file  Tobacco Use   Smoking status: Former    Types: Cigarettes   Smokeless tobacco: Never   Tobacco comments:    Quit 7 months  Vaping Use   Vaping Use: Never used  Substance and Sexual Activity   Alcohol use: Yes    Comment: beer every couple of days   Drug use: Not Currently    Types: Cocaine    Comment: sober 6 months   Sexual activity: Not on file  Other Topics Concern   Not on file  Social History Narrative   Not on file   Social Determinants of Health   Financial Resource Strain: Not on file  Food Insecurity: Not on file  Transportation Needs: Not on file  Physical Activity: Not on file  Stress: Not on file  Social Connections: Not on file  Intimate Partner Violence: Not on file     (Not in a hospital admission)   Review of Systems  Cardiovascular:  Positive for chest pain, dyspnea on exertion and leg swelling. Negative for palpitations and syncope.      Physical Exam: Physical Exam Vitals and nursing note reviewed.  Constitutional:      General: He is not in acute distress. Neck:     Vascular: No JVD.  Cardiovascular:     Rate and Rhythm: Normal rate and regular rhythm.  Heart sounds: Normal heart sounds. No murmur heard. Pulmonary:     Effort: Pulmonary effort is normal.     Breath sounds: Normal breath sounds. No wheezing or rales.  Musculoskeletal:     Right lower leg: Edema (2+) present.     Left lower leg: Edema (2+) present.        Imaging/tests reviewed and independently interpreted: Lab Results:  Latest Reference Range & Units 10/27/21 09:25 10/27/21 10:56  Troponin I (High Sensitivity) <18 ng/L 116 (HH) 105 (HH)  (HH): Data is critically high       Component Ref Range & Units 10:56 3 yr ago  Alcohol, Ethyl (B) <10 mg/dL 151 High   30 High  CM   Comment: (NOTE)  Lowest detectable limit for serum alcohol is 10 mg/dL.   For medical purposes only.  Performed at [facility], [place] [address] [place], [place]  [address]   Resulting Agency  [facility] CLIN LAB [facility] LAB       Cardiac Studies:  Telemetry 10/27/2021: No significant arrhtymia  EKG 10/27/2021: Sinus rhythm Nonspecific T wave abnormality, lateral leads  Echocardiogram: Ordered  Assessment & Recommendations:   56 y.o. African American male  with hypertension, hyperlipidemia, type 2 DM, obesity, OSA, polysubstance abuse-cigarettes 15 PY, EtoH 6 pack/day, cocaine, h/o prostate cancer, h/o seizures, admitted with chest pain  Chest pain: Unstable angina/NSTEMI also differential.  Multiple risk factors for CAD as we as use of cocaine, nitrate responsive chest pain.   HS is relatively low and flat, nonspecific TWI on EKG. Other differentials include gastritis or other alcohol related presentations.  Given multiple risk factors, will treat with heparin for possible ACS. Recommend echocardiogram. I expect to see reduced LVEF. Will likely perform left +/- right heart catheterization tomorrow.  Strict I/O, daily weights. Continue Aspirin 81 mg daily, Lipitor 40 mg daily. Uptitrate as necessary pending lipid panel and cath results. Okay to continue amlodipine 10, losartan 50 for now. Will avoid beta blockers at this time given recent cocaine abuse.  Currently on lasix PO 40 mg daily, K 3.3. Added spironolactone 25 mg daily.  Rest of the management as per the primary team.    Discussed interpretation of tests and management recommendations with the primary team     Nigel Mormon, MD Pager: [phone] Office: [phone]

## 2021-10-27 NOTE — H&P (View-Only) (Signed)
CARDIOLOGY CONSULT NOTE  Patient ID: Dillon Wang MRN: [id number] DOB/AGE: [birth year] 55 y.o.  Admit date: 10/27/2021 Referring Physician: [facility] Pont[facility] Reason for Consultation:  Chest pain  HPI:   56 y.o. African American male  with hypertension, hyperlipidemia, type 2 DM, obesity, OSA, polysubstance abuse-cigarettes 15 PY, EtoH 6 pack/day, cocaine, h/o prostate cancer, h/o seizures, admitted with chest pain  Patient lives with his parents, is uninsured, non-compliant with his mediations, does not have a PCP. He finished drinking his 6 pack at 12:30 AM, also had cocaine. At 2:30 AM, he started having excruciating retrosternal chest pain, that has resolved since he took 4 Aspirin and nitroglycerin. Currently, he is chest pain free. On further questioning, he reports exertional dyspnea after walking 15-20 steps, 1 pillow orthopnea, bilateral leg edema for close to two years.   Workup in the ER showed nonspecific lateral TWI on EKG, Trop HS 116-->106, ethaol level 152 (normal <15), chest Xray with Cardiomegaly without infiltrate or edema, probable bibasilar subsegmental atelectasis.  Past Medical History:  Diagnosis Date   Cancer [facility])    prostate   Diabetes mellitus without complication (HCC)    GERD (gastroesophageal reflux disease)    Hyperlipidemia    Hypertension    Obstructive sleep apnea 09/20/2017   Seizures ([place])    Sleep apnea    Tussive syncope 08/05/2018     Past Surgical History:  Procedure Laterality Date   TESTICLE REMOVAL Left       Family History  Problem Relation Age of Onset   Hypertension Mother    Diabetes Maternal Aunt    Seizures Father    Seizures Brother    Colon cancer Neg Hx    Esophageal cancer Neg Hx    Rectal cancer Neg Hx    Stomach cancer Neg Hx      Social History: Social History   Socioeconomic History   Marital status: Single    Spouse name: Not on file   Number of children: Not on file   Years of education: Not on file   Highest  education level: Not on file  Occupational History   Not on file  Tobacco Use   Smoking status: Former    Types: Cigarettes   Smokeless tobacco: Never   Tobacco comments:    Quit 7 months  Vaping Use   Vaping Use: Never used  Substance and Sexual Activity   Alcohol use: Yes    Comment: beer every couple of days   Drug use: Not Currently    Types: Cocaine    Comment: sober 6 months   Sexual activity: Not on file  Other Topics Concern   Not on file  Social History Narrative   Not on file   Social Determinants of Health   Financial Resource Strain: Not on file  Food Insecurity: Not on file  Transportation Needs: Not on file  Physical Activity: Not on file  Stress: Not on file  Social Connections: Not on file  Intimate Partner Violence: Not on file     (Not in a hospital admission)   Review of Systems  Cardiovascular:  Positive for chest pain, dyspnea on exertion and leg swelling. Negative for palpitations and syncope.      Physical Exam: Physical Exam Vitals and nursing note reviewed.  Constitutional:      General: He is not in acute distress. Neck:     Vascular: No JVD.  Cardiovascular:     Rate and Rhythm: Normal rate and regular rhythm.  Heart sounds: Normal heart sounds. No murmur heard. Pulmonary:     Effort: Pulmonary effort is normal.     Breath sounds: Normal breath sounds. No wheezing or rales.  Musculoskeletal:     Right lower leg: Edema (2+) present.     Left lower leg: Edema (2+) present.        Imaging/tests reviewed and independently interpreted: Lab Results:  Latest Reference Range & Units 10/27/21 09:25 10/27/21 10:56  Troponin I (High Sensitivity) <18 ng/L 116 (HH) 105 (HH)  (HH): Data is critically high       Component Ref Range & Units 10:56 3 yr ago  Alcohol, Ethyl (B) <10 mg/dL 151 High   30 High  CM   Comment: (NOTE)  Lowest detectable limit for serum alcohol is 10 mg/dL.   For medical purposes only.  Performed at [facility], [place] [address] [place],   [address]   Resulting Agency  [facility] CLIN LAB [facility]       Cardiac Studies:  Telemetry 10/27/2021: No significant arrhtymia  EKG 10/27/2021: Sinus rhythm Nonspecific T wave abnormality, lateral leads  Echocardiogram: Ordered  Assessment & Recommendations:   56 y.o. African American male  with hypertension, hyperlipidemia, type 2 DM, obesity, OSA, polysubstance abuse-cigarettes 15 PY, EtoH 6 pack/day, cocaine, h/o prostate cancer, h/o seizures, admitted with chest pain  Chest pain: Unstable angina/NSTEMI also differential.  Multiple risk factors for CAD as we as use of cocaine, nitrate responsive chest pain.   HS is relatively low and flat, nonspecific TWI on EKG. Other differentials include gastritis or other alcohol related presentations.  Given multiple risk factors, will treat with heparin for possible ACS. Recommend echocardiogram. I expect to see reduced LVEF. Will likely perform left +/- right heart catheterization tomorrow.  Strict I/O, daily weights. Continue Aspirin 81 mg daily, Lipitor 40 mg daily. Uptitrate as necessary pending lipid panel and cath results. Okay to continue amlodipine 10, losartan 50 for now. Will avoid beta blockers at this time given recent cocaine abuse.  Currently on lasix PO 40 mg daily, K 3.3. Added spironolactone 25 mg daily.  Rest of the management as per the primary team.    Discussed interpretation of tests and management recommendations with the primary team     Nigel Mormon, MD Pager: [phone] Office: [phone]

## 2021-10-27 NOTE — H&P (Signed)
Date: 10/27/2021               Patient Name:  Dillon Wang MRN: [id number]  DOB: 1966-01-12 Age / Sex: 56 y.o., male   PCP: No PCP              Medical Service: Internal Medicine Teaching Service              Attending Physician: Dr. Lottie Mussel, MD    First Contact: Max Rerkpatt[place]ipat, MS3 Pager: [phone]  Second Contact: Dr. Leigh Aurora, DO Pager: [phone]  Third Contact Dr. Linwood Dibbles, MD Pager: [phone]       After Hours (After 5p/  First Contact Pager: [phone]  weekends / holidays): Second Contact Pager: [phone]   Chief Complaint: Squeezing chest pain  History of Present Illness: Dillon Wang is a 56 y.o. male with past medical history of Diabetes Mellitis, HTN, hyperlipidemia, GERD, OSA, seizures, and testicular cancer who presents with acute chest pain. Patient states that he typically experiences heartburn, but the onset of his chest pain last night felt different from heartburn. He describes the pain as squeezing and heavy with intense pain. The pain began last evening and gradually got worse. Patient reports he was short of breath and had to bend over a few times because of the intense pain. He does complain of pain radiating to his left arm down to his wrist and nowhere else. On the onset of pain last night, he stated the pain was 10/10, and at time of examination, he was at a 7/10. Patient says leading up to last evening his week was typical with no chest pains related to this current episode. Patient says this type of acute onset of chest pain has never happened before. Patient reports occasional cocaine use with the last time being Wednesday night 8/23 when he did "a couple bumps". Patient reports drinking about a 12 pack of standard beer this week. Patient reports smoking 1 PPD for 15 years.  Patient reports SOB last night with onset of chest pain. Currently regular work of breathing. Patient reports sweating during onset of symptoms. Patient denies fever, nausea,  vomiting, headaches, or muscle pain. Patient denies issues with PO intake, urination, and bowel movements.  Meds:  Current Outpatient Medications  Medication Instructions   amLODipine (NORVASC) 10 mg, Oral, Daily   aspirin 81 mg, Oral, Daily   atorvastatin (LIPITOR) 40 mg, Oral, Daily   budesonide-formoterol (SYMBICORT) 160-4.5 MCG/ACT inhaler 2 puffs, Inhalation, 2 times daily   furosemide (LASIX) 40 mg, Oral, Daily PRN   glimepiride (AMARYL) 2 mg, Oral, Daily before breakfast   hydrochlorothiazide (HYDRODIURIL) 25 mg, Oral, Daily, Take on tablet in the morning.   insulin glargine (LANTUS) 10 Units, Subcutaneous, Daily at bedtime   levETIRAcetam (KEPPRA) 750 mg, Oral, 2 times daily   metFORMIN (GLUCOPHAGE XR) 1,000 mg, Oral, Daily with breakfast   Misc. Devices MISC Auto bipap with Medium size [employer] Full Face Mask Simplus mask/ heated humidification. Autopap settings 15-20 cm H2O.   Needles & Syringes MISC 10 Units, Does not apply, 4 times daily, 10 units of Lantus at bedtime   potassium chloride SA (KLOR-CON) 20 MEQ tablet 20 mEq, Oral, Daily   VENTOLIN HFA 108 (90 Base) MCG/ACT inhaler INHALE 2 PUFFS INTO THE LUNGS EVERY 4 HOURS AS NEEDED FOR WHEEZING ORSHORTNESS OF BREATH     Allergies: Allergies as of 10/27/2021 - Review Complete 10/27/2021  Allergen Reaction Noted   Penicillins Anaphylaxis 09/20/2017   Past Medical History:  Diagnosis Date   Cancer [facility])    prostate   Diabetes mellitus without complication ([place])    GERD (gastroesophageal reflux disease)    Hyperlipidemia    Hypertension    Obstructive sleep apnea 09/20/2017   Seizures ([place])    Sleep apnea    Tussive syncope 08/05/2018   Per patient, no prior hospitalizations or surgeries   Family History: Pt. 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 No current PCP per patient  Review of Systems: A complete ROS was negative except as per HPI.   Physical Exam: Blood pressure (!) 183/112, pulse 69, temperature 97.6 F (36.4 C), temperature source Oral, resp. rate 14, SpO2 97 %. Constitutional: Appears well-developed and well-nourished; poor hygiene. Patient is in mild distress and appears uncomfortable. Patient appears mildly fatigued. Patient is cooperative and able to answer questions appropriately. HEENT: Normocephalic. Pupils equal, round, and reactive. Cardiovascular: Normal rate, regular rhythm. Radial pulses +2 bilaterally. Dorsalis pedal pulses +2 bilaterally Respiratory: Effort is normal on room air.  Lungs are clear to auscultation bilaterally. GI: Soft. Mildly-distended. Non-tender to palpation. No appreciable organomegaly.  Musculoskeletal: Normal bulk and tone. +1 pitting edema in bilateral LE. Neurological: Alert and oriented x4, no apparent focal deficits noted. Skin: Warm and dry.  No rash, erythema, lesions noted. Psychiatric: Normal mood and affect. Behavior is normal.   Labs: CBC    Component Value Date/Time   WBC 7.6 10/27/2021 0925   RBC 5.07 10/27/2021 0925   HGB 15.7 10/27/2021 0925   HGB 16.9 01/12/2018 1149   HCT 46.1 10/27/2021 0925   HCT 48.3 01/12/2018 1149   PLT 179 10/27/2021 0925   PLT 226 01/12/2018 1149   MCV 90.9 10/27/2021 0925   MCV 89 01/12/2018 1149   MCH 31.0 10/27/2021 0925   MCHC 34.1 10/27/2021 0925   RDW 13.4 10/27/2021 0925   RDW 13.2 01/12/2018 1149   LYMPHSABS 2.6 01/12/2018 1149   EOSABS 0.2 01/12/2018 1149   BASOSABS 0.1 01/12/2018 1149     BMP     Component Value Date/Time   NA 137 10/27/2021 0925   K 3.6 10/27/2021 0925   CL 103 10/27/2021 0925   CO2 24 10/27/2021 0925   GLUCOSE 152 (H) 10/27/2021 0925   BUN 11 10/27/2021 0925   CREATININE 1.09 10/27/2021 0925    CALCIUM 8.5 (L) 10/27/2021 0925   PROT 6.9 10/27/2021 1056   ALBUMIN 3.6 10/27/2021 1056   AST 23 10/27/2021 1056   ALT 22 10/27/2021 1056   ALKPHOS 67 10/27/2021 1056   BILITOT 0.5 10/27/2021 1056   GFRNONAA >60 10/27/2021 0925   Troponin I 116; 0924 8/27 Troponin I 105; 1056 8/27 Ethanol 151 08/27  Imaging: CT HEAD WO CONTRAST (5MM)  Result Date: 10/27/2021 CLINICAL DATA:  Mental status change, unknown cause EXAM: CT HEAD WITHOUT CONTRAST TECHNIQUE: Contiguous axial images were obtained from the base of the skull through the vertex without intravenous contrast. RADIATION DOSE REDUCTION: This exam was performed according to the departmental dose-optimization program which includes automated exposure control, adjustment of the mA and/or kV according to patient size and/or use of iterative reconstruction technique. COMPARISON:  Brain MRI 02/21/2018 FINDINGS: Brain: No evidence of acute infarction, hemorrhage, hydrocephalus, extra-axial collection or mass lesion/mass effect. Vascular:  No hyperdense vessel or unexpected calcification. Skull: Normal. Negative for fracture or focal lesion. Sinuses/Orbits: Mucosal thickening in the right frontal sinus and ethmoid air cells. Probable polyp or retention cyst in left maxillary sinus. Mild sinus disease in the right maxillary sinus. Other: None IMPRESSION: 1. No acute intracranial abnormality. 2. Mild paranasal sinus disease. Electronically Signed   By: Markus Daft M.D.   On: 10/27/2021 12:13   DG Chest 2 View  Result Date: 10/27/2021 CLINICAL DATA:  Heartburn. EXAM: CHEST - 2 VIEW COMPARISON:  None Available. FINDINGS: Cardiomegaly. The hila and mediastinum are unremarkable. Mild haziness in the bases is likely atelectasis. No overt edema. No pneumothorax. No other acute abnormalities. IMPRESSION: Cardiomegaly without infiltrate or edema. Probable bibasilar subsegmental atelectasis. Electronically Signed   By: Dorise Bullion III M.D.   On: 10/27/2021  11:12    EKG: personally reviewed my interpretation is normal rate with sinus rhythm and minor ST elevation  CXR: personally reviewed my interpretation is cardiomegaly, no pulmonary infiltration  Assessment & Plan by Problem: Principal Problem:   Acute chest pain  Patient Summary  Ryden Wainer is a 56 y.o. male with past medical history of Diabetes Mellitis, HTN, hyperlipidemia, GERD, OSA, seizures, and testicular cancer who presents with acute chest pain.  Acute coronary syndrome evaluation with radiation and diaphoresis in the setting of HTN, HLD, and cocaine use Given patient's acute-onset intense chest pain yesterday (8/26) evening that patient describes as squeezing and radiating, there is concern for acute coronary syndrome. Patient reports that this type of episode has not happened before in the past. Patient also has a history of hypertension and hyperlipidemia that could contribute to this acute cardiac presentation. Patient's BP has been significantly elevated since presenting to the hospital today 8/27 (130s-180s/90s-120s). Patient presents with +1 bilateral LE edema, which could also suggest a more chronic cardiac process. Patient also reports occasionally using cocaine (last time on 8/23), which could also contribute to an acute coronary syndrome. Additionally, patient's troponin today 8/27 has been elevated today at 116 and 105. ECG shows normal rate with sinus rhythm and mild ST elevation. - Cardiology consult for ACS eval - Pharmacy consult for heparin in ACS - F/u echocardiogram - Start continuous cardiac monitoring - Start nitroglycerin 0.4 mg sublingual PRN today 8/27 and start Aspirin 81 mg tablet daily tomorrow 8/28 for acute cardiac presentation - Start Lasix table 40 mg daily tomorrow 8/28 for fluid accumulation - F/u Lipid panel - Strict I/O, daily weights  Hypertension; Hyperlipidemia Patient presents with a history of HTN and hyperlipidemia. On admission 8/27  patient has BP of 183/112. Patient reports being compliant with his home medications. - Start amlodipine 10 mg tablet daily, losartan 50 mg tablet daily, and spironolactone 25 mg tablet today 8/27 for HTN - Continue to monitor BP - Start Atorvastatin 40 mg tablet daily tomorrow 8/28 for hyperlipidemia - F/u Lipid panel - Monitor BMP d/t starting new ARB  Diabetes Mellitis, Type II Patient has a history of and reports a history of diabetes mellitis.  - Start patient on Novolog today 0-15 units, subcutaneous, with meals - F/u hemoglobin A1C labs in the AM 8/28  Seizures Patient has a history of seizures and taking Keppra  - Start patient on 750 mg tablet BID today 8/27  Cocaine-use disorder Patient reports a history of occassionally using cocaine with the last time being 8/23. - F/u UDS - Educate patient on cocaine use cessation and cocaine's impacts on health  Diet: Heart healthy/carb  modified VTE: Lovenox injection 40 mg Code: Full   Prior to Admission Living Arrangement: Home Anticipated Discharge Location: Home Barriers to Discharge: Patient currently does not have PCP Dispo: Anticipated discharge in approximately 1 day(s).  Dispo: Admit patient to Observation with expected length of stay less than 2 midnights.  Signed: Pirapat (Max) Rerkpattanapipat, [facility] Medical Student Pager Number: [phone] 10/27/2021, 1:20 PM    I personally saw and evaluated the patient. Mr. Swamy is a 56 year old male with PMH of seizures, HTN, HLD, Cocaine-Use Disorder, and tobacco-use disorder who presented with chest pain that started last night. The pain radiates down his arm, and states it is a 7/10 currently. Cardiology is on board for further management. We will appreciate their recommendations, and start him on nitroglycerin .'4mg'$  PRN, aspirin '81mg'$ , Lasix '40mg'$ , spironolactone '25mg'$ , and Losartan '50mg'$ . Echo is currently pending. Could be NSTEMI/Unstable angina due to PMH of responsiveness to  nitrates. Pt will be going for left and right heart cath tomorrow for further evaluation.   -Dr. Marlene Lard Sherrell Weir  Pager Number: [phone]

## 2021-10-27 NOTE — Progress Notes (Signed)
ANTICOAGULATION CONSULT NOTE  Pharmacy Consult for Heparin Indication: chest pain/ACS Brief A/P: Heparin level subtherapeutic Increase Heparin rate  Allergies  Allergen Reactions   Penicillins Anaphylaxis    Patient Measurements: Height: '5\' 9"'$  (175.3 cm) Weight: 109.3 kg (240 lb 14.4 oz) IBW/kg (Calculated) : 70.7 Heparin Dosing Weight: 95 kg  Vital Signs: Temp: 98.9 F (37.2 C) (08/27 2019) Temp Source: Oral (08/27 2004) BP: 161/98 (08/27 2019) Pulse Rate: 80 (08/27 2019)  Labs: Recent Labs    10/27/21 0925 10/27/21 1056 10/27/21 1555 10/27/21 2227  HGB 15.7  --   --   --   HCT 46.1  --   --   --   PLT 179  --   --   --   LABPROT  --   --  15.4*  --   INR  --   --  1.2  --   HEPARINUNFRC  --   --   --  <0.10*  CREATININE 1.09  --   --   --   TROPONINIHS 116* 105*  --   --      Estimated Creatinine Clearance: 93.3 mL/min (by C-G formula based on SCr of 1.09 mg/dL).  Assessment: 56 y.o. male with chest pain for heparin   Goal of Therapy:  Heparin level 0.3-0.7 units/ml Monitor platelets by anticoagulation protocol: Yes   Plan:  Heparin 2000 units IV bolus, then increase heparin 1650 units/hr Follow-up am labs.  Phillis Knack, PharmD, BCPS

## 2021-10-27 NOTE — ED Triage Notes (Signed)
Patient complains of chest that started yesterday. Patient reports last night felt as heart burn. Received sl ntg pta with relief. Patient alert and oriented

## 2021-10-28 ENCOUNTER — Observation Stay (HOSPITAL_COMMUNITY): Payer: Self-pay

## 2021-10-28 ENCOUNTER — Inpatient Hospital Stay (HOSPITAL_COMMUNITY)
Admission: EM | Disposition: A | Discharge status: Home / Self Care | Payer: Self-pay | Source: Home / Self Care | Attending: Internal Medicine

## 2021-10-28 ENCOUNTER — Other Ambulatory Visit (HOSPITAL_COMMUNITY): Payer: Self-pay

## 2021-10-28 DIAGNOSIS — I214 Non-ST elevation (NSTEMI) myocardial infarction: Secondary | ICD-10-CM

## 2021-10-28 DIAGNOSIS — F1021 Alcohol dependence, in remission: Secondary | ICD-10-CM

## 2021-10-28 DIAGNOSIS — E1165 Type 2 diabetes mellitus with hyperglycemia: Secondary | ICD-10-CM

## 2021-10-28 DIAGNOSIS — Z6835 Body mass index (BMI) 35.0-35.9, adult: Secondary | ICD-10-CM

## 2021-10-28 DIAGNOSIS — F1490 Cocaine use, unspecified, uncomplicated: Secondary | ICD-10-CM

## 2021-10-28 DIAGNOSIS — E039 Hypothyroidism, unspecified: Secondary | ICD-10-CM

## 2021-10-28 DIAGNOSIS — I252 Old myocardial infarction: Secondary | ICD-10-CM

## 2021-10-28 DIAGNOSIS — Z794 Long term (current) use of insulin: Secondary | ICD-10-CM

## 2021-10-28 DIAGNOSIS — F1910 Other psychoactive substance abuse, uncomplicated: Secondary | ICD-10-CM

## 2021-10-28 DIAGNOSIS — Z87891 Personal history of nicotine dependence: Secondary | ICD-10-CM

## 2021-10-28 DIAGNOSIS — I10 Essential (primary) hypertension: Secondary | ICD-10-CM

## 2021-10-28 HISTORY — PX: LEFT HEART CATH AND CORONARY ANGIOGRAPHY: CATH118249

## 2021-10-28 HISTORY — PX: CORONARY STENT INTERVENTION: CATH118234

## 2021-10-28 LAB — ECHOCARDIOGRAM COMPLETE
Area-P 1/2: 3.06 cm2
Height: 69 in
S' Lateral: 3.4 cm
Weight: 3856 oz

## 2021-10-28 LAB — BASIC METABOLIC PANEL
Anion gap: 11 (ref 5–15)
BUN: 8 mg/dL (ref 6–20)
CO2: 23 mmol/L (ref 22–32)
Calcium: 8.8 mg/dL — ABNORMAL LOW (ref 8.9–10.3)
Chloride: 103 mmol/L (ref 98–111)
Creatinine, Ser: 1.04 mg/dL (ref 0.61–1.24)
GFR, Estimated: >60 mL/min (ref >60)
Glucose, Bld: 125 mg/dL — ABNORMAL HIGH (ref 70–99)
Potassium: 3.7 mmol/L (ref 3.5–5.1)
Sodium: 137 mmol/L (ref 135–145)

## 2021-10-28 LAB — BRAIN NATRIURETIC PEPTIDE: B Natriuretic Peptide: 66 pg/mL (ref 0.0–100.0)

## 2021-10-28 LAB — GLUCOSE, CAPILLARY
Glucose-Capillary: 149 mg/dL — ABNORMAL HIGH (ref 70–99)
Glucose-Capillary: 219 mg/dL — ABNORMAL HIGH (ref 70–99)
Glucose-Capillary: 125 mg/dL — ABNORMAL HIGH (ref 70–99)

## 2021-10-28 LAB — LIPID PANEL
Cholesterol: 193 mg/dL (ref 0–200)
HDL: 55 mg/dL (ref >40)
LDL Cholesterol: 119 mg/dL — ABNORMAL HIGH (ref 0–99)
Total CHOL/HDL Ratio: 3.5 RATIO
Triglycerides: 95 mg/dL (ref <150)
VLDL: 19 mg/dL (ref 0–40)

## 2021-10-28 LAB — HEPARIN LEVEL (UNFRACTIONATED): Heparin Unfractionated: 0.2 IU/mL — ABNORMAL LOW (ref 0.30–0.70)

## 2021-10-28 LAB — HEMOGLOBIN A1C
Hgb A1c MFr Bld: 6.6 % — ABNORMAL HIGH (ref 4.8–5.6)
Mean Plasma Glucose: 142.72 mg/dL

## 2021-10-28 LAB — POCT ACTIVATED CLOTTING TIME: Activated Clotting Time: 455 seconds

## 2021-10-28 SURGERY — LEFT HEART CATH AND CORONARY ANGIOGRAPHY
Anesthesia: LOCAL

## 2021-10-28 MED ORDER — TIROFIBAN HCL IN NACL 5-0.9 MG/100ML-% IV SOLN
0.1500 ug/kg/min | INTRAVENOUS | Status: AC
  Administered 2021-10-28: 0.15 ug/kg/min via INTRAVENOUS
  Filled 2021-10-28 (×2): qty 100

## 2021-10-28 MED ORDER — IOHEXOL 350 MG/ML SOLN
INTRAVENOUS | Status: DC | PRN
  Administered 2021-10-28: 205 mL

## 2021-10-28 MED ORDER — TICAGRELOR 90 MG PO TABS
ORAL_TABLET | ORAL | Status: DC | PRN
  Administered 2021-10-28: 180 mg via ORAL

## 2021-10-28 MED ORDER — SODIUM CHLORIDE 0.9 % IV SOLN: INTRAVENOUS | Status: AC

## 2021-10-28 MED ORDER — TIROFIBAN (AGGRASTAT) BOLUS VIA INFUSION
INTRAVENOUS | Status: DC | PRN
  Administered 2021-10-28: 2732.5 ug via INTRAVENOUS

## 2021-10-28 MED ORDER — NITROGLYCERIN IN D5W 200-5 MCG/ML-% IV SOLN
INTRAVENOUS | Status: AC
  Filled 2021-10-28: qty 250

## 2021-10-28 MED ORDER — HYDRALAZINE HCL 20 MG/ML IJ SOLN
INTRAMUSCULAR | Status: DC | PRN
  Administered 2021-10-28 (×2): 10 mg via INTRAVENOUS

## 2021-10-28 MED ORDER — HEPARIN (PORCINE) IN NACL 1000-0.9 UT/500ML-% IV SOLN
INTRAVENOUS | Status: AC
Start: 2021-10-28 — End: ?
  Filled 2021-10-28: qty 1000

## 2021-10-28 MED ORDER — SODIUM CHLORIDE 0.9% FLUSH
3.0000 mL | Freq: Two times a day (BID) | INTRAVENOUS | Status: DC
  Administered 2021-10-28 – 2021-10-29 (×2): 3 mL via INTRAVENOUS

## 2021-10-28 MED ORDER — VERAPAMIL HCL 2.5 MG/ML IV SOLN
INTRAVENOUS | Status: DC | PRN
  Administered 2021-10-28: 10 mL via INTRA_ARTERIAL

## 2021-10-28 MED ORDER — HEPARIN SODIUM (PORCINE) 1000 UNIT/ML IJ SOLN
INTRAMUSCULAR | Status: DC | PRN
  Administered 2021-10-28 (×2): 5000 [IU] via INTRAVENOUS
  Administered 2021-10-28: 4000 [IU] via INTRAVENOUS

## 2021-10-28 MED ORDER — TICAGRELOR 90 MG PO TABS
ORAL_TABLET | ORAL | Status: AC
Start: 2021-10-28 — End: ?
  Filled 2021-10-28: qty 2

## 2021-10-28 MED ORDER — VERAPAMIL HCL 2.5 MG/ML IV SOLN
INTRAVENOUS | Status: DC | PRN
  Administered 2021-10-28 (×2): 300 ug via INTRACORONARY

## 2021-10-28 MED ORDER — HYDRALAZINE HCL 20 MG/ML IJ SOLN
INTRAMUSCULAR | Status: AC
  Filled 2021-10-28: qty 1

## 2021-10-28 MED ORDER — HEPARIN (PORCINE) IN NACL 1000-0.9 UT/500ML-% IV SOLN
INTRAVENOUS | Status: DC | PRN
  Administered 2021-10-28 (×3): 500 mL

## 2021-10-28 MED ORDER — TIROFIBAN HCL IN NACL 5-0.9 MG/100ML-% IV SOLN
INTRAVENOUS | Status: AC
Start: 2021-10-28 — End: ?
  Filled 2021-10-28: qty 100

## 2021-10-28 MED ORDER — HYDRALAZINE HCL 20 MG/ML IJ SOLN
10.0000 mg | INTRAMUSCULAR | Status: AC | PRN
Start: 2021-10-28 — End: 2021-10-28

## 2021-10-28 MED ORDER — LABETALOL HCL 5 MG/ML IV SOLN
10.0000 mg | INTRAVENOUS | Status: AC | PRN
Start: 2021-10-28 — End: 2021-10-28

## 2021-10-28 MED ORDER — ASPIRIN 81 MG PO CHEW: 81.0000 mg | CHEWABLE_TABLET | ORAL | Status: DC

## 2021-10-28 MED ORDER — ACETAMINOPHEN 325 MG PO TABS: 650.0000 mg | ORAL_TABLET | ORAL | Status: DC | PRN

## 2021-10-28 MED ORDER — SODIUM CHLORIDE 0.9% FLUSH: 3.0000 mL | INTRAVENOUS | Status: DC | PRN

## 2021-10-28 MED ORDER — MIDAZOLAM HCL 2 MG/2ML IJ SOLN
INTRAMUSCULAR | Status: AC
  Filled 2021-10-28: qty 2

## 2021-10-28 MED ORDER — FENTANYL CITRATE (PF) 100 MCG/2ML IJ SOLN
INTRAMUSCULAR | Status: AC
  Filled 2021-10-28: qty 2

## 2021-10-28 MED ORDER — SODIUM CHLORIDE 0.9 % IV SOLN: INTRAVENOUS | Status: DC

## 2021-10-28 MED ORDER — TIROFIBAN HCL IN NACL 5-0.9 MG/100ML-% IV SOLN
INTRAVENOUS | Status: AC
  Filled 2021-10-28: qty 100

## 2021-10-28 MED ORDER — SODIUM CHLORIDE 0.9 % IV SOLN
250.0000 mL | INTRAVENOUS | Status: DC | PRN
Start: 2021-10-28 — End: 2021-10-29

## 2021-10-28 MED ORDER — TICAGRELOR 90 MG PO TABS
90.0000 mg | ORAL_TABLET | Freq: Two times a day (BID) | ORAL | Status: DC
  Administered 2021-10-28 – 2021-10-29 (×2): 90 mg via ORAL
  Filled 2021-10-28 (×2): qty 1

## 2021-10-28 MED ORDER — SODIUM CHLORIDE 0.9 % IV SOLN
250.0000 mL | INTRAVENOUS | Status: DC | PRN
Start: 2021-10-28 — End: 2021-10-28

## 2021-10-28 MED ORDER — VERAPAMIL HCL 2.5 MG/ML IV SOLN
INTRAVENOUS | Status: AC
  Filled 2021-10-28: qty 2

## 2021-10-28 MED ORDER — SODIUM CHLORIDE 0.9% FLUSH
3.0000 mL | INTRAVENOUS | Status: DC | PRN
Start: 2021-10-28 — End: 2021-10-29

## 2021-10-28 MED ORDER — NITROGLYCERIN IN D5W 200-5 MCG/ML-% IV SOLN
INTRAVENOUS | Status: AC | PRN
  Administered 2021-10-28: 10 ug/min via INTRAVENOUS

## 2021-10-28 MED ORDER — TIROFIBAN HCL IN NACL 5-0.9 MG/100ML-% IV SOLN
INTRAVENOUS | Status: AC | PRN
  Administered 2021-10-28: .15 ug/kg/min via INTRAVENOUS

## 2021-10-28 MED ORDER — FENTANYL CITRATE (PF) 100 MCG/2ML IJ SOLN
INTRAMUSCULAR | Status: DC | PRN
  Administered 2021-10-28: 25 ug via INTRAVENOUS

## 2021-10-28 MED ORDER — SODIUM CHLORIDE 0.9 % IV SOLN
INTRAVENOUS | Status: AC | PRN
  Administered 2021-10-28: 250 mL via INTRAVENOUS

## 2021-10-28 MED ORDER — ONDANSETRON HCL 4 MG/2ML IJ SOLN: 4.0000 mg | Freq: Four times a day (QID) | INTRAMUSCULAR | Status: DC | PRN

## 2021-10-28 MED ORDER — HEPARIN SODIUM (PORCINE) 5000 UNIT/ML IJ SOLN
5000.0000 [IU] | Freq: Three times a day (TID) | INTRAMUSCULAR | Status: DC
  Administered 2021-10-29: 5000 [IU] via SUBCUTANEOUS
  Filled 2021-10-28: qty 1

## 2021-10-28 MED ORDER — HEPARIN SODIUM (PORCINE) 1000 UNIT/ML IJ SOLN
INTRAMUSCULAR | Status: AC
  Filled 2021-10-28: qty 10

## 2021-10-28 MED ORDER — LIDOCAINE HCL (PF) 1 % IJ SOLN
INTRAMUSCULAR | Status: DC | PRN
  Administered 2021-10-28: 2 mL

## 2021-10-28 MED ORDER — SODIUM CHLORIDE 0.9% FLUSH: 3.0000 mL | Freq: Two times a day (BID) | INTRAVENOUS | Status: DC

## 2021-10-28 MED ORDER — NITROGLYCERIN 1 MG/10 ML FOR IR/CATH LAB
INTRA_ARTERIAL | Status: DC | PRN
  Administered 2021-10-28 (×3): 200 ug via INTRACORONARY

## 2021-10-28 MED ORDER — LIDOCAINE HCL (PF) 1 % IJ SOLN
INTRAMUSCULAR | Status: AC
Start: 2021-10-28 — End: ?
  Filled 2021-10-28: qty 30

## 2021-10-28 SURGICAL SUPPLY — 29 items
BALLN SAPPHIRE 3.0X15 (BALLOONS) ×1
BALLN SAPPHIRE 4.0X15 (BALLOONS) ×1
BALLN ~~LOC~~ EMERGE MR 4.0X15 (BALLOONS) ×2
BALLOON SAPPHIRE 3.0X15 (BALLOONS) IMPLANT
BALLOON SAPPHIRE 4.0X15 (BALLOONS) IMPLANT
BALLOON ~~LOC~~ EMERGE MR 4.0X15 (BALLOONS) IMPLANT
BAND CMPR LRG ZPHR (HEMOSTASIS) ×1
BAND ZEPHYR COMPRESS 30 LONG (HEMOSTASIS) IMPLANT
CATH 5FR JL3.5 JR4 ANG PIG MP (CATHETERS) IMPLANT
CATH EXPO 5F MPA-1 (CATHETERS) IMPLANT
CATH EXTRAC PRONTO 5.5F 138CM (CATHETERS) IMPLANT
CATH INFINITI 5 FR 3DRC (CATHETERS) IMPLANT
CATH INFINITI 5FR AL1 (CATHETERS) IMPLANT
CATH LAUNCHER 6FR EBU3.5 (CATHETERS) IMPLANT
CATH LAUNCHER 6FR JR4 (CATHETERS) IMPLANT
GLIDESHEATH SLEND A-KIT 6F 22G (SHEATH) IMPLANT
GUIDEWIRE INQWIRE 1.5J.035X260 (WIRE) IMPLANT
INQWIRE 1.5J .035X260CM (WIRE) ×1
KIT ENCORE 26 ADVANTAGE (KITS) IMPLANT
KIT HEART LEFT (KITS) ×2 IMPLANT
PACK CARDIAC CATHETERIZATION (CUSTOM PROCEDURE TRAY) ×2 IMPLANT
STENT SYNERGY XD 3.50X28 (Permanent Stent) IMPLANT
STENT SYNERGY XD 4.0X20 (Permanent Stent) IMPLANT
SYNERGY XD 3.50X28 (Permanent Stent) ×1 IMPLANT
SYNERGY XD 4.0X20 (Permanent Stent) ×1 IMPLANT
TRANSDUCER W/STOPCOCK (MISCELLANEOUS) ×2 IMPLANT
TUBING CIL FLEX 10 FLL-RA (TUBING) ×2 IMPLANT
VALVE GUARDIAN II ~~LOC~~ HEMO (MISCELLANEOUS) IMPLANT
WIRE ASAHI PROWATER 180CM (WIRE) IMPLANT

## 2021-10-28 NOTE — TOC Initial Note (Signed)
Transition of Care [facility]) - Initial/Assessment Note    Patient Details  Name: Dillon Wang MRN: [id number] Date of Birth: February 24, 1966  Transition of [facility]) CM/SW Contact:    Tom-Johnson, Renea Ee, RN Phone Number: 10/28/2021, 3:44 PM  Clinical Narrative:                  Patient is admitted for Acute Chest Pain. Had scheduled R/L Heart cath and Coronary Angiography today. Has PMH of Polysubstance Abuse, ETOH 6 pack/day, Cocaine, Prostate Cancer, Seizures. From home with parents. PCP is Kerin Perna, NP and uses [facility].  Does not have medical Insurance, MATCH done for prescription assistance.  No PT/OT needs or recommendations noted at this time. CM will continue to follow with needs as patient progresses with care.    Barriers to Discharge: Continued Medical Work up   Patient Goals and CMS Choice Patient states their goals for this hospitalization and ongoing recovery are:: To return home CMS Medicare.gov Compare Post Acute Care list provided to:: Patient Choice offered to / list presented to : Patient  Expected Discharge Plan and Services     Discharge Planning Services: CM Consult, [place] [facility], Medication Assistance   Living arrangements for the past 2 months: Single Family Home                                      Prior Living Arrangements/Services Living arrangements for the past 2 months: Single Family Home Lives with:: Parents Patient language and need for interpreter reviewed:: Yes Do you feel safe going back to the place where you live?: Yes      Need for Family Participation in Patient Care: Yes (Comment) Care giver support system in place?: Yes (comment)   Criminal Activity/Legal Involvement Pertinent to Current Situation/Hospitalization: No - Comment as needed  Activities of Daily Living Home Assistive Devices/Equipment: CBG Meter ADL Screening (condition at time of admission) Patient's cognitive ability adequate  to safely complete daily activities?: Yes Is the patient deaf or have difficulty hearing?: No Does the patient have difficulty seeing, even when wearing glasses/contacts?: No Does the patient have difficulty concentrating, remembering, or making decisions?: No Patient able to express need for assistance with ADLs?: Yes Does the patient have difficulty dressing or bathing?: No Independently performs ADLs?: Yes (appropriate for developmental age) Does the patient have difficulty walking or climbing stairs?: No Weakness of Legs: None Weakness of Arms/Hands: None  Permission Sought/Granted Permission sought to share information with : Case Manager, Customer service manager, Family Supports Permission granted to share information with : Yes, Verbal Permission Granted              Emotional Assessment Appearance:: Appears stated age Attitude/Demeanor/Rapport: Engaged, Gracious Affect (typically observed): Accepting, Appropriate, Calm, Hopeful Orientation: : Oriented to Self, Oriented to Place, Oriented to  Time, Oriented to Situation Alcohol / Substance Use: Alcohol Use Psych Involvement: No (comment)  Admission diagnosis:  Elevated troponin [R77.8] Acute chest pain [R07.9] Polysubstance abuse (HCC) [F19.10] Chest pain, unspecified type [R07.9] Patient Active Problem List   Diagnosis Date Noted   Non-ST elevation (NSTEMI) myocardial infarction [facility])    Acute chest pain 10/27/2021   Polysubstance use disorder 10/27/2021   Type 2 diabetes mellitus with hyperglycemia, without long-term current use of insulin ([place]) 09/20/2018   Tussive syncope 08/05/2018   Acute hypoxemic respiratory failure ([place]) 02/20/2018   Atypical chest pain 09/20/2017  Obstructive sleep apnea 09/20/2017   Daytime somnolence 09/20/2017   Morbid obesity ([place]) 09/20/2017   Hypertension    Cocaine abuse ([place])    Tobacco use disorder    Alcohol abuse    PCP:  Kerin Perna, NP Pharmacy:    [place], [place] [address] Phone: [phone] Fax: [phone]  [place] [address] Phone: [phone] Fax: [phone]  [id number] [place]: [phone]: [phone] [facility] [address]: [phone]: [phone]    Social Determinants of Health (SDOH) Interventions    Readmission Risk Interventions     No data to display

## 2021-10-28 NOTE — Progress Notes (Signed)
   10/28/21 1010  Clinical Encounter Type  Visited With Health care provider  Visit Type Initial  Referral From Physician;Nurse Dorcas Carrow. Marcello Moores, MD; Angelia Mould, RN)  Consult/Referral To Chaplain Melvenia Beam)  Recommendations Advanced Directive Education   Patient Gone to Cath-Lab and NOT Available. [address] [place], [place] [phone]

## 2021-10-28 NOTE — Progress Notes (Signed)
ANTICOAGULATION CONSULT NOTE  Pharmacy Consult for Heparin Indication: chest pain/ACS  Allergies  Allergen Reactions   Penicillins Anaphylaxis    Patient Measurements: Height: '5\' 9"'$  (175.3 cm) Weight: 109.3 kg (241 lb) IBW/kg (Calculated) : 70.7 Heparin Dosing Weight: 95 kg  Vital Signs: Temp: 98.8 F (37.1 C) (08/28 0734) Temp Source: Oral (08/28 0734) BP: 170/96 (08/28 0734) Pulse Rate: 69 (08/28 0734)  Labs: Recent Labs    10/27/21 0925 10/27/21 1056 10/27/21 1555 10/27/21 2227 10/28/21 0804  HGB 15.7  --   --   --   --   HCT 46.1  --   --   --   --   PLT 179  --   --   --   --   LABPROT  --   --  15.4*  --   --   INR  --   --  1.2  --   --   HEPARINUNFRC  --   --   --  <0.10* 0.20*  CREATININE 1.09  --   --   --  1.04  TROPONINIHS 116* 105*  --   --   --      Estimated Creatinine Clearance: 97.7 mL/min (by C-G formula based on SCr of 1.04 mg/dL).  Assessment: 56 y.o. male with chest pain on heparin. Plans are for cath today -heparin level= 0.2 on heparin 1650 units/hr   Goal of Therapy:  Heparin level 0.3-0.7 units/ml Monitor platelets by anticoagulation protocol: Yes   Plan:  -increase heparin to 1850 units/hr -will follow plans post cath  Hildred Laser, PharmD Clinical Pharmacist **Pharmacist phone directory can now be found on [email or web address] ([email or web address]).  Listed under [place].

## 2021-10-28 NOTE — Interval H&P Note (Signed)
History and Physical Interval Note:  10/28/2021 10:51 AM  Dillon Wang  has presented today for surgery, with the diagnosis of NSTEMI.  The various methods of treatment have been discussed with the patient and family. After consideration of risks, benefits and other options for treatment, the patient has consented to  Procedure(s): RIGHT/LEFT HEART CATH AND CORONARY ANGIOGRAPHY (N/A) as a surgical intervention.  The patient's history has been reviewed, patient examined, no change in status, stable for surgery.  I have reviewed the patient's chart and labs.  Questions were answered to the patient's satisfaction.    2016 Appropriate Use Criteria for Coronary Revascularization in Patients With Acute Coronary Syndrome NSTEMI/Unstable angina, stabilized patient at high risk Indication:  Revascularization by PCI or CABG of 1 or more arteries in a patient with NSTEMI or unstable angina with Stabilization after presentation High risk for clinical events A (7) Indication: 16; Score 7   [place]

## 2021-10-28 NOTE — Plan of Care (Signed)
  Problem: Education: Goal: Ability to describe self-care measures that may prevent or decrease complications (Diabetes Survival Skills Education) will improve Outcome: Progressing Goal: Individualized Educational Video(s) Outcome: Progressing   Problem: Coping: Goal: Ability to adjust to condition or change in health will improve Outcome: Progressing   Problem: Fluid Volume: Goal: Ability to maintain a balanced intake and output will improve Outcome: Progressing   Problem: Health Behavior/Discharge Planning: Goal: Ability to identify and utilize available resources and services will improve Outcome: Progressing Goal: Ability to manage health-related needs will improve Outcome: Progressing   Problem: Metabolic: Goal: Ability to maintain appropriate glucose levels will improve Outcome: Progressing   Problem: Nutritional: Goal: Maintenance of adequate nutrition will improve Outcome: Progressing Goal: Progress toward achieving an optimal weight will improve Outcome: Progressing   Problem: Skin Integrity: Goal: Risk for impaired skin integrity will decrease Outcome: Progressing   Problem: Tissue Perfusion: Goal: Adequacy of tissue perfusion will improve Outcome: Progressing   Problem: Education: Goal: Knowledge of General Education information will improve Description: Including pain rating scale, medication(s)/side effects and non-pharmacologic comfort measures Outcome: Progressing   Problem: Health Behavior/Discharge Planning: Goal: Ability to manage health-related needs will improve Outcome: Progressing   Problem: Clinical Measurements: Goal: Ability to maintain clinical measurements within normal limits will improve Outcome: Progressing Goal: Will remain free from infection Outcome: Progressing Goal: Diagnostic test results will improve Outcome: Progressing Goal: Respiratory complications will improve Outcome: Progressing Goal: Cardiovascular complication will  be avoided Outcome: Progressing   Problem: Activity: Goal: Risk for activity intolerance will decrease Outcome: Progressing   Problem: Nutrition: Goal: Adequate nutrition will be maintained Outcome: Progressing   Problem: Coping: Goal: Level of anxiety will decrease Outcome: Progressing   Problem: Elimination: Goal: Will not experience complications related to bowel motility Outcome: Progressing Goal: Will not experience complications related to urinary retention Outcome: Progressing   Problem: Pain Managment: Goal: General experience of comfort will improve Outcome: Progressing   Problem: Safety: Goal: Ability to remain free from injury will improve Outcome: Progressing   Problem: Skin Integrity: Goal: Risk for impaired skin integrity will decrease Outcome: Progressing   Problem: Education: Goal: Understanding of CV disease, CV risk reduction, and recovery process will improve Outcome: Progressing Goal: Individualized Educational Video(s) Outcome: Progressing   Problem: Activity: Goal: Ability to return to baseline activity level will improve Outcome: Progressing   Problem: Cardiovascular: Goal: Ability to achieve and maintain adequate cardiovascular perfusion will improve Outcome: Progressing Goal: Vascular access site(s) Level 0-1 will be maintained Outcome: Progressing   Problem: Health Behavior/Discharge Planning: Goal: Ability to safely manage health-related needs after discharge will improve Outcome: Progressing   Problem: Education: Goal: Understanding of cardiac disease, CV risk reduction, and recovery process will improve Outcome: Progressing Goal: Individualized Educational Video(s) Outcome: Progressing   Problem: Activity: Goal: Ability to tolerate increased activity will improve Outcome: Progressing   Problem: Cardiac: Goal: Ability to achieve and maintain adequate cardiovascular perfusion will improve Outcome: Progressing   Problem:  Health Behavior/Discharge Planning: Goal: Ability to safely manage health-related needs after discharge will improve Outcome: Progressing

## 2021-10-28 NOTE — Progress Notes (Signed)
  Echocardiogram 2D Echocardiogram has been performed.  Dillon Wang 10/28/2021, 10:53 AM

## 2021-10-28 NOTE — Progress Notes (Signed)
Subjective:  Dillon Wang is a 56 y.o. male with past medical history of Diabetes Mellitis Type 2, HTN, Hyperlipidemia, GERD, OSA, seizures, and testicular cancer who presents with acute chest pain.  Overnight Events: No acute events or concerns overnight.  Evaluated patient bedside after he had returned from the cath lab. Patient reports being uncomfortable throughout the night with chest pain and sweating. Patient denies nausea, vomiting, shortness of breath.  Patient his chest pain has improved since stent procedure in cath lab and pain is now at a 2/10 pain.  Patient clarifies history of drinking 12-pack of beer per day for about 10 years and has been using cocaine for about 15 years.  Objective:  Vital signs in last 24 hours: Vitals:   10/28/21 1345 10/28/21 1350 10/28/21 1355 10/28/21 1400  BP:      Pulse: (!) 0 (!) 0 (!) 0 (!) 0  Resp:      Temp:      TempSrc:      SpO2:      Weight:      Height:       Weight change:   Intake/Output Summary (Last 24 hours) at 10/28/2021 1542 Last data filed at 10/28/2021 6962 Gross per 24 hour  Intake 212.64 ml  Output 600 ml  Net -387.36 ml    Physical Exam  Constitutional: Patient lying in bed and appears fatigued; poor hygiene. In no acute distress, but appears uncomfortable. Patient is cooperative and able to answer questions appropriately HENT: normocephalic Cardiovascular: regular rate with normal rhythm Pulmonary/Chest: normal work of breathing on room air, lungs clear to auscultation bilaterally Abdominal: soft, non-tender, non-distended, bowel sounds present MSK: normal bulk and tone, mild LE pitting edema, improved from exam yesterday Skin: warm and dry. Neurological: alert and answering questions appropriately. Psych: appropriate mood and affect   Labs: CBC    Component Value Date/Time   WBC 7.6 10/27/2021 0925   RBC 5.07 10/27/2021 0925   HGB 15.7 10/27/2021 0925   HGB 16.9 01/12/2018 1149   HCT 46.1  10/27/2021 0925   HCT 48.3 01/12/2018 1149   PLT 179 10/27/2021 0925   PLT 226 01/12/2018 1149   MCV 90.9 10/27/2021 0925   MCV 89 01/12/2018 1149   MCH 31.0 10/27/2021 0925   MCHC 34.1 10/27/2021 0925   RDW 13.4 10/27/2021 0925   RDW 13.2 01/12/2018 1149   LYMPHSABS 2.6 01/12/2018 1149   EOSABS 0.2 01/12/2018 1149   BASOSABS 0.1 01/12/2018 1149     CMP     Component Value Date/Time   NA 137 10/28/2021 0804   NA 139 01/12/2018 1149   K 3.7 10/28/2021 0804   CL 103 10/28/2021 0804   CO2 23 10/28/2021 0804   GLUCOSE 125 (H) 10/28/2021 0804   BUN 8 10/28/2021 0804   BUN 11 01/12/2018 1149   CREATININE 1.04 10/28/2021 0804   CALCIUM 8.8 (L) 10/28/2021 0804   PROT 6.9 10/27/2021 1056   PROT 7.5 01/12/2018 1149   ALBUMIN 3.6 10/27/2021 1056   ALBUMIN 4.9 01/12/2018 1149   AST 23 10/27/2021 1056   ALT 22 10/27/2021 1056   ALKPHOS 67 10/27/2021 1056   BILITOT 0.5 10/27/2021 1056   BILITOT 0.6 01/12/2018 1149   GFRNONAA >60 10/28/2021 0804   GFRAA >60 02/22/2018 0317    Troponin I 116; 0924 8/27 Troponin I 105; 1056 8/27 Ethanol 151 08/27  Glucose-capillary 155 Hg A1C 6.7 Heparin unfractionated <0.10 UDS Cocaine positive PT INR 15.4, 1.2  Lipid panel LDL 119 on 8/28  Imaging: Per cardiology, echo from 8/28 shows 55 to 60% LVEF with normal function and no regionally wall motion abnormalities. There is borderline LV hypertrophy. RV is normal in size, wall thickness, and systolic function. Left atrium with mild dilation.  EKG on 8/28 with normal rate and sinus rhythm with mild ST elevation (largely unchanged from yesterday)  Assessment/Plan:  Principal Problem:   Acute chest pain Active Problems:   Morbid obesity ([place])   Hypertension   Type 2 diabetes mellitus with hyperglycemia, without long-term current use of insulin ([place])   Polysubstance use disorder   Patient Summary  Dillon Wang is a 56 y.o. male with past medical history of Diabetes Mellitis Type 2,  HTN, Hyperlipidemia, GERD, OSA, seizures, and testicular cancer who presents with acute chest pain.   NSTEMI Given patient's acute-onset intense chest pain evening of 8/26 that patient describes as squeezing and radiating, there is concern for acute coronary syndrome. Further details in H&P. Additionally, patient's troponin on 8/27 was elevated at 116 and 105. ECG shoed normal rate with sinus rhythm. Patient's chest pain is responsive to nitroglycerin, which could suggest UA. Echocardiogram today overall normal.  - Appreciate Cardiology recommendations - Left +/- right heart cath today  - Appreciate pharmacy recommendation for heparin in ACS - Continue continuous cardiac monitoring - Continue nitroglycerin 0.4 mg sublingual PRN and Aspirin 81 mg tablet daily for acute cardiac presentation - Continue Lasix tablet 40 mg for fluid accumulation - Start ticagrelor tablet 90 mg BID as antiplatelet therapy - Strict I's/O's, daily weights   Hypertension; Hyperlipidemia Patient presents with a history of HTN and hyperlipidemia. On admission 8/27 patient had BP of 183/112. Patient reports being compliant with his home medications - Continue amlodipine 10 mg tablet daily, losartan 50 mg tablet daily, and spironolactone 25 mg tablet for HTN - Continue to monitor BP - Continue atorvastatin 40 mg tablet daily for hyperlipidemia - F/u Lipid panel - Monitor BMP d/t starting new ARB   Diabetes Mellitis, Type II Patient has a history of and reports a history of diabetes mellitis. A1C 6.6 on 8/28. - Continue Novolog 0-15 units, subcutaneous, with meals - Continue CBG monitoring   Seizures Patient has a history of seizures and taking Keppra  - continue patient on 750 mg tablet BID   Cocaine-use disorder Patient reports a history of occassionally using cocaine with the last time being 8/23. 8/27 UDS positive for cocaine - Educate patient on cocaine use cessation and cocaine's impacts on health  History  of heavy Alcohol use - Start CIWA without ativan   Diet: Heart healthy/carb modified VTE: Lovenox injection 40 mg Code: Full   Prior to Admission Living Arrangement: Home Anticipated Discharge Location: Home Barriers to Discharge: Patient currently does not have PCP and needs to be set up with one Dispo: Anticipated discharge in approximately 1 day(s).  Dispo: Admit patient to Observation with expected length of stay less than 2 midnights.   LOS: 0 days   Dillon Wang (Max) [place], MS3 Pager Number: [phone] 10/28/2021, 4:17 PM

## 2021-10-29 ENCOUNTER — Telehealth: Payer: Self-pay

## 2021-10-29 ENCOUNTER — Encounter (HOSPITAL_COMMUNITY): Payer: Self-pay | Admitting: Cardiology

## 2021-10-29 ENCOUNTER — Other Ambulatory Visit (HOSPITAL_COMMUNITY): Payer: Self-pay

## 2021-10-29 DIAGNOSIS — Z7984 Long term (current) use of oral hypoglycemic drugs: Secondary | ICD-10-CM

## 2021-10-29 DIAGNOSIS — E785 Hyperlipidemia, unspecified: Secondary | ICD-10-CM

## 2021-10-29 DIAGNOSIS — F1090 Alcohol use, unspecified, uncomplicated: Secondary | ICD-10-CM

## 2021-10-29 DIAGNOSIS — E876 Hypokalemia: Secondary | ICD-10-CM

## 2021-10-29 DIAGNOSIS — R569 Unspecified convulsions: Secondary | ICD-10-CM

## 2021-10-29 LAB — CBC
HCT: 51.2 % (ref 39.0–52.0)
Hemoglobin: 17.7 g/dL — ABNORMAL HIGH (ref 13.0–17.0)
MCH: 31.1 pg (ref 26.0–34.0)
MCHC: 34.6 g/dL (ref 30.0–36.0)
MCV: 90 fL (ref 80.0–100.0)
Platelets: 189 10*3/uL (ref 150–400)
RBC: 5.69 MIL/uL (ref 4.22–5.81)
RDW: 13.3 % (ref 11.5–15.5)
WBC: 11.7 10*3/uL — ABNORMAL HIGH (ref 4.0–10.5)
nRBC: 0 % (ref 0.0–0.2)

## 2021-10-29 LAB — BASIC METABOLIC PANEL
Anion gap: 11 (ref 5–15)
BUN: 10 mg/dL (ref 6–20)
CO2: 21 mmol/L — ABNORMAL LOW (ref 22–32)
Calcium: 9.1 mg/dL (ref 8.9–10.3)
Chloride: 101 mmol/L (ref 98–111)
Creatinine, Ser: 1.19 mg/dL (ref 0.61–1.24)
GFR, Estimated: >60 mL/min (ref >60)
Glucose, Bld: 145 mg/dL — ABNORMAL HIGH (ref 70–99)
Potassium: 3.2 mmol/L — ABNORMAL LOW (ref 3.5–5.1)
Sodium: 133 mmol/L — ABNORMAL LOW (ref 135–145)

## 2021-10-29 LAB — GLUCOSE, CAPILLARY
Glucose-Capillary: 176 mg/dL — ABNORMAL HIGH (ref 70–99)
Glucose-Capillary: 190 mg/dL — ABNORMAL HIGH (ref 70–99)

## 2021-10-29 LAB — POCT ACTIVATED CLOTTING TIME
Activated Clotting Time: 287 seconds
Activated Clotting Time: 967 seconds
Activated Clotting Time: >1000 seconds

## 2021-10-29 LAB — TROPONIN I (HIGH SENSITIVITY): Troponin I (High Sensitivity): 4204 ng/L (ref <18)

## 2021-10-29 MED ORDER — LOSARTAN POTASSIUM 100 MG PO TABS
100.0000 mg | ORAL_TABLET | Freq: Every day | ORAL | 5 refills | Status: DC
  Filled 2021-10-29: qty 30, 30d supply, fill #0

## 2021-10-29 MED ORDER — METFORMIN HCL ER 500 MG PO TB24
1000.0000 mg | ORAL_TABLET | Freq: Every day | ORAL | 3 refills | Status: DC
  Filled 2021-10-29: qty 60, 30d supply, fill #0

## 2021-10-29 MED ORDER — SPIRONOLACTONE 25 MG PO TABS
25.0000 mg | ORAL_TABLET | Freq: Every day | ORAL | 5 refills | Status: DC
  Filled 2021-10-29: qty 30, 30d supply, fill #0

## 2021-10-29 MED ORDER — LEVETIRACETAM 750 MG PO TABS
750.0000 mg | ORAL_TABLET | Freq: Two times a day (BID) | ORAL | 5 refills | Status: DC
  Filled 2021-10-29: qty 60, 30d supply, fill #0

## 2021-10-29 MED ORDER — ALBUTEROL SULFATE HFA 108 (90 BASE) MCG/ACT IN AERS
2.0000 | INHALATION_SPRAY | RESPIRATORY_TRACT | 2 refills | Status: DC | PRN
  Filled 2021-10-29: qty 18, 30d supply, fill #0

## 2021-10-29 MED ORDER — LOSARTAN POTASSIUM 50 MG PO TABS
100.0000 mg | ORAL_TABLET | Freq: Every day | ORAL | Status: DC
  Administered 2021-10-29: 100 mg via ORAL
  Filled 2021-10-29: qty 2

## 2021-10-29 MED ORDER — ASPIRIN 81 MG PO CHEW
81.0000 mg | CHEWABLE_TABLET | Freq: Every day | ORAL | 11 refills | Status: DC
  Filled 2021-10-29: qty 30, 30d supply, fill #0

## 2021-10-29 MED ORDER — POTASSIUM CHLORIDE CRYS ER 20 MEQ PO TBCR
40.0000 meq | EXTENDED_RELEASE_TABLET | Freq: Once | ORAL | Status: AC
  Administered 2021-10-29: 40 meq via ORAL
  Filled 2021-10-29: qty 2

## 2021-10-29 MED ORDER — BUDESONIDE-FORMOTEROL FUMARATE 160-4.5 MCG/ACT IN AERO
2.0000 | INHALATION_SPRAY | Freq: Two times a day (BID) | RESPIRATORY_TRACT | 3 refills | Status: DC
  Filled 2021-10-29: qty 10.2, 30d supply, fill #0

## 2021-10-29 MED ORDER — AMLODIPINE BESYLATE 10 MG PO TABS
10.0000 mg | ORAL_TABLET | Freq: Every day | ORAL | 5 refills | Status: DC
  Filled 2021-10-29: qty 30, 30d supply, fill #0
  Filled 2022-01-29: qty 30, 30d supply, fill #1

## 2021-10-29 MED ORDER — FUROSEMIDE 40 MG PO TABS
40.0000 mg | ORAL_TABLET | Freq: Every day | ORAL | 5 refills | Status: DC
  Filled 2021-10-29: qty 30, 30d supply, fill #0

## 2021-10-29 MED ORDER — NITROGLYCERIN 0.4 MG SL SUBL
0.4000 mg | SUBLINGUAL_TABLET | SUBLINGUAL | 12 refills | Status: AC | PRN
  Filled 2021-10-29: qty 25, 7d supply, fill #0

## 2021-10-29 MED ORDER — POTASSIUM CHLORIDE CRYS ER 20 MEQ PO TBCR
20.0000 meq | EXTENDED_RELEASE_TABLET | Freq: Every day | ORAL | 3 refills | Status: DC
  Filled 2021-10-29: qty 30, 30d supply, fill #0

## 2021-10-29 MED ORDER — TICAGRELOR 90 MG PO TABS
90.0000 mg | ORAL_TABLET | Freq: Two times a day (BID) | ORAL | 5 refills | Status: DC
  Filled 2021-10-29: qty 60, 30d supply, fill #0

## 2021-10-29 MED ORDER — ROSUVASTATIN CALCIUM 40 MG PO TABS
40.0000 mg | ORAL_TABLET | Freq: Every day | ORAL | 5 refills | Status: DC
  Filled 2021-10-29: qty 30, 30d supply, fill #0

## 2021-10-29 MED FILL — Midazolam HCl Inj 2 MG/2ML (Base Equivalent): INTRAMUSCULAR | Qty: 2 | Status: AC

## 2021-10-29 NOTE — Telephone Encounter (Signed)
Called pt no answer and could not leave a vm

## 2021-10-29 NOTE — Progress Notes (Signed)
Subjective:  One episode of chest pain this morning at rest, resolved with NTG  Objective:  Vital Signs in the last 24 hours: Temp:  [98.3 F (36.8 C)-98.7 F (37.1 C)] 98.3 F (36.8 C) (08/29 0440) Pulse Rate:  [0-93] 86 (08/29 0535) Resp:  [11-25] 14 (08/29 0440) BP: (114-190)/(69-123) 168/107 (08/29 0834) SpO2:  [89 %-100 %] 97 % (08/29 0440) Weight:  [117.6 kg] 117.6 kg (08/29 0500)  Intake/Output from previous day: 08/28 0701 - 08/29 0700 In: 635.5 [I.V.:635.5] Out: 2450 [Urine:2450]  Physical Exam Vitals and nursing note reviewed.  Constitutional:      General: He is not in acute distress. Neck:     Vascular: No JVD.  Cardiovascular:     Rate and Rhythm: Normal rate and regular rhythm.     Heart sounds: Normal heart sounds. No murmur heard. Pulmonary:     Effort: Pulmonary effort is normal.     Breath sounds: Normal breath sounds. No wheezing or rales.  Musculoskeletal:     Right lower leg: No edema.     Left lower leg: No edema.      Imaging/tests reviewed and independently interpreted: CXR 10/27/2021: Cardiomegaly without infiltrate or edema. Probable bibasilar subsegmental atelectasis.    Cardiac Studies:  Telemetry [id number]: No significant arrhythmia  EKG 10/29/2021: Sinus rhythm Inferior infarct, age indeterminate  Echocardiogram 10/28/2021:  1. Left ventricular ejection fraction, by estimation, is 55 to 60%. The  left ventricle has normal function. The left ventricle has no regional  wall motion abnormalities. Left ventricular diastolic parameters are  indeterminate.   2. Right ventricular systolic function is normal. The right ventricular  size is normal.   3. Left atrial size was mildly dilated.   4. The mitral valve is normal in structure. Mild mitral valve  regurgitation. No evidence of mitral stenosis.   5. The aortic valve is normal in structure. Aortic valve regurgitation is  not visualized. No aortic stenosis is present.   Coronary  intervention 10/29/2021: LM: Normal LAD: Ostial to prox diffuse 40% disease Lcx: Large vessel        Mid OM1 (Non-culprit) tandem 60%-80% stenoses RCA: Prox 95% stenosis (Culprit)         Disal focal 60% disease   Successful percutaneous coronary intervention prox RCA and mid OM1        PTCA and stent placement 3.5 X 28 mm Synergy drug-eluting stent-->post dilatation with 4.61m Fouke balloon up to 18 atm        PTCA and stent placement 4.0 X 20 mm Synergy drug-eluting stent-->post dilatation with 4.017mNC balloon up to 18 atm   0% residual stenoses in prox RCA and mid OM1 Distal embolization in small caliber distal OM1   Recommend IV nitroglycerin 10 mics an hour overnight, and IV Aggrastat for 6 hours. Recommend DAPT with aspirin and Brilinta for 1 year    Assessment & Recommendations:  [age].o. African American male  with hypertension, hyperlipidemia, type 2 DM, obesity, OSA, polysubstance abuse-cigarettes 15 PY, EtoH 6 pack/day, cocaine, h/o prostate cancer, h/o seizures, admitted with chest pain  Chest pain: NSTEMI Successful PCI to proximal circumflex and mid OM1. Distal embolization of small caliber distal OM1, possibly with periprocedural infarct. Check troponin months to estimate infarct size.  I anticipate high sensitive troponin to be <5000. Creatinine of nitroglycerin drip. Recommend DAPT with aspirin and Brilinta for 1 year. Cautious introduction of metoprolol tartrate for milligram twice daily.  Counseled regarding ongoing cocaine use. Increase losartan to  100 mg daily. Continue amlodipine 10 mg daily. Recommend Crestor 40 mg daily.  F/u with me on 11/05/2021 2:45 PM  Discussed interpretation of tests and management recommendations with the primary team   Nigel Mormon, MD Pager: [phone] Office: [phone]

## 2021-10-29 NOTE — Discharge Summary (Addendum)
Name: Dillon Wang MRN: [id number] DOB: Jan 02, 1966 56 y.o. PCP: Kerin Perna, NP  Date of Admission: 10/27/2021  9:14 AM Date of Discharge: 10/29/2021 Attending Physician: Dr.  Lottie Mussel   Discharge Diagnosis: Principal Problem:   NSTEMI (non-ST elevated myocardial infarction) [facility]) Active Problems:   Morbid obesity ([place])   Hypertension   Type 2 diabetes mellitus with hyperglycemia, without long-term current use of insulin (HCC)   Acute chest pain   Polysubstance abuse [facility])    Discharge Medications: Allergies as of 10/29/2021       Reactions   Penicillins Anaphylaxis        Medication List     STOP taking these medications    atorvastatin 40 MG tablet Commonly known as: Lipitor   glimepiride 2 MG tablet Commonly known as: AMARYL   hydrochlorothiazide 25 MG tablet Commonly known as: HYDRODIURIL   insulin glargine 100 UNIT/ML injection Commonly known as: LANTUS   Needles & Syringes Misc       TAKE these medications    amLODipine 10 MG tablet Commonly known as: NORVASC Take 1 tablet (10 mg total) by mouth daily.   Aspirin Low Dose 81 MG chewable tablet Generic drug: aspirin Chew 1 tablet (81 mg total) by mouth daily.   Brilinta 90 MG Tabs tablet Generic drug: ticagrelor Take 1 tablet (90 mg total) by mouth 2 (two) times daily.   budesonide-formoterol 160-4.5 MCG/ACT inhaler Commonly known as: SYMBICORT Inhale 2 puffs into the lungs 2 (two) times daily.   furosemide 40 MG tablet Commonly known as: LASIX Take 1 tablet (40 mg total) by mouth daily. Start taking on: October 30, 2021 What changed:  when to take this reasons to take this   levETIRAcetam 750 MG tablet Commonly known as: Keppra Take 1 tablet (750 mg total) by mouth 2 (two) times daily.   losartan 100 MG tablet Commonly known as: COZAAR Take 1 tablet (100 mg total) by mouth daily. Start taking on: October 30, 2021   metFORMIN 500 MG 24 hr tablet Commonly known as:  GLUCOPHAGE-XR Take 2 tablets (1,000 mg total) by mouth daily with breakfast.   Misc. Devices Misc Auto bipap with Medium size [employer] Full Face Mask Simplus mask/ heated humidification. Autopap settings 15-20 cm H2O.   nitroGLYCERIN 0.4 MG SL tablet Commonly known as: NITROSTAT Place 1 tablet (0.4 mg total) under the tongue every 5 (five) minutes as needed for chest pain.   potassium chloride SA 20 MEQ tablet Commonly known as: KLOR-CON M Take 1 tablet (20 mEq total) by mouth daily.   rosuvastatin 40 MG tablet Commonly known as: CRESTOR Take 1 tablet (40 mg total) by mouth daily.   spironolactone 25 MG tablet Commonly known as: ALDACTONE Take 1 tablet (25 mg total) by mouth daily. Start taking on: October 30, 2021   Ventolin HFA 108 (90 Base) MCG/ACT inhaler Generic drug: albuterol Inhale 2 puffs into the lungs every 4 (four) hours as needed for wheezing or shortness of breath. What changed: See the new instructions.        Disposition and follow-up:   [name] was discharged from [facility] in Good condition.  At the hospital follow up visit please address:  1.  Follow-up:  a.  NSTEMI and chest pain. Ensure patient has been compliant with medication. Ensure patient has not had any exertional chest pain or shortness of breath on exertion     b. Follow up on HTN treatment, HLD, and Type II  DM as this can contribute to coronary artery disease.    2.  Labs / imaging needed at time of follow-up: BMP as patient was recently started on high dose of losartan and K was supplemented prior to discharge.   Follow-up Appointments:  Follow-up Information     Nigel Mormon, MD Follow up on 11/05/2021.   Specialties: Cardiology, Radiology Why: 2:45 PM Contact information: [place] [address] [place]. Go on 11/07/2021.   Why: You have been scheduled to see  Dr. Howie Ill at 1:15pm at the internal medicine center to follow-up for hospital follow-up and to establish care..  The address is found on here. Contact information: [address] [place] [facility] Course by problem list: This is a 56 year old male with a past medical history of diabetes mellitus, hypertension, hyperlipidemia, GERD, OSA, seizures, and testicular cancer who presented with acute chest pain.  Patient was found to have elevated troponin initially of 116 (08/27) and on repeat it was 105.  Patient was admitted for NSTEMI.  #NSTEMI status post stenting -Initial troponin on 10/27/2021 elevated at 116 and again at 105.  -10/27/2021 echo showing normal ejection fraction of 55 to 60%. - Patient had left and right heart cath on 10/28/2021 that showed Proximal 95% stenosis of RCA requiring DES and 60%-80% stenosis of the Mid OM1 requiring DES.  -Patient was initially started on heparin drip, prior to and after, patient on aspirin, and Brilinta.  Patient also was started on atorvastatin initially, but was switched to rosuvastatin 40 mg per cardiology -Lasix given and during admission, patient's fluid status remained stable and patient is currently anemia -On discharge day, there was an incident where the patient had some Chest pain which woke him up out of his sleep, and the cardiologist was made aware of this. Trop was 4200, however this was anticipated by the cardiologist given that there may have been a thrombus that went distally to the inferior portion of the circumflex. Cardiology deemed it safe to follow up outpatient.  [facility] patient discharged on aspirin 81 mg daily, Brilinta 90 mg twice daily, metoprolol succinate 25 mg daily  #Hypertension #Hyperlipidemia -Blood pressure was elevated on admission with initial BP of 183/112 -Patient was initially on amlodipine 10 mg daily, losartan 50 mg daily, and spironolactone 25  mg daily.  Patient was also on atorvastatin 40 mg daily -Cardiology recommended switching patient to rosuvastatin 40 mg daily and increasing losartan to 100 milligrams daily. -Blood pressure was stable during admission, and patient will be discharged on amlodipine 10 mg daily, losartan 100 mg daily, and spironolactone 25 mg daily, and rosuvastatin 40 mg daily -Patient to continue to follow-up outpatient for blood pressure control  #Diabetes mellitus, Type II -Patient initial A1c 6.8 -Patient was started on sliding scale insulin on admission, and home metformin was held -Glucose remained stable and was not too elevated during admission ranging from 103-190 -Patient to restart home metformin of 1000 mg BID outpatient and insulin will be stopped. -further management outpatient   #Alcohol use disorder #Cocaine use -08/29 Drug screen positive for cocaine and ethanol level 151. Patient instructed to stay away from both alcohol and cocaine.  -Patient was initially put on CIWA given that the patient drinks about 12 pack of beer a day.  Patient  did not have any withdrawal symptoms at hospital.  #Hypokalemia -Patient had a mildly low potassium at 3.2. It was supplemented prior to discharge   #Seizures -Patient has history of seizures and takes Keppra 750 mg twice daily.   -Patient continues to receive Keppra in hospital, no seizure activity during hospital stay -Patient will be discharged on home Keppra 750 mg twice daily     Discharge Subjective:  Patient is doing well today. He was in the room with cardiac rehab upon my exam. He states that he is doing well. He denies any chest pain. He denies any shortness of breath. He reports that earlier he did have some chest pain this morning, but it was resolved by the time I came to examine him, Patient has no concerns at this time. He states that he is ready to go home.   Discharge Exam:   BP (!) 168/107   Pulse 86   Temp 98.3 F (36.8 C) (Oral)    Resp 14   Ht _0  (1.753 m)   Wt 117.6 kg   SpO2 97%   BMI 38.29 kg/m  Constitutional: Well-appearing and resting in bed in no acute distress HENT: normocephalic atraumatic, mucous membranes moist Eyes: conjunctiva non-erythematous Cardiovascular: regular rate and rhythm, no m/r/g, 2+ pulses to bilateral upper and lower extremities noted Pulmonary/Chest: normal work of breathing on room air, lungs clear to auscultation bilaterally Abdominal: soft, non-tender, non-distended MSK: normal bulk and tone Neurological: alert & oriented x 3, 5/5 strength in bilateral upper and lower extremities Skin: warm and dry Psych: Normal mood and affect  Pertinent Labs, Studies, and Procedures:     Latest Ref Rng & Units 10/29/2021    2:20 AM 10/27/2021    9:25 AM 02/20/2018    3:20 AM  CBC  WBC 4.0 - 10.5 K/uL 11.7  7.6  10.2   Hemoglobin 13.0 - 17.0 g/dL 17.7  15.7  15.0   Hematocrit 39.0 - 52.0 % 51.2  46.1  41.1   Platelets 150 - 400 K/uL 189  179  178        Latest Ref Rng & Units 10/29/2021    2:20 AM 10/28/2021    8:04 AM 10/27/2021   10:56 AM  CMP  Glucose 70 - 99 mg/dL 145  125    BUN 6 - 20 mg/dL 10  8    Creatinine 0.61 - 1.24 mg/dL 1.19  1.04    Sodium 135 - 145 mmol/L 133  137    Potassium 3.5 - 5.1 mmol/L 3.2  3.7    Chloride 98 - 111 mmol/L 101  103    CO2 22 - 32 mmol/L 21  23    Calcium 8.9 - 10.3 mg/dL 9.1  8.8    Total Protein 6.5 - 8.1 g/dL   6.9   Total Bilirubin 0.3 - 1.2 mg/dL   0.5   Alkaline Phos 38 - 126 U/L   67   AST 15 - 41 U/L   23   ALT 0 - 44 U/L   22     CARDIAC CATHETERIZATION  Result Date: 10/28/2021 Images from the original result were not included. LM: Normal LAD: Ostial to prox diffuse 40% disease Lcx: Large vessel        Mid OM1 (Non-culprit) tandem 60%-80% stenoses RCA: Prox 95% stenosis (Culp[place])         Disal focal 60% disease Successful percutaneous coronary intervention prox RCA and mid OM1  PTCA and stent placement 3.5 X 28 mm Synergy  drug-eluting stent-->post dilatation with 4.37m [place] balloon up to 18 atm        PTCA and stent placement 4.0 X 20 mm Synergy drug-eluting stent-->post dilatation with 4.042mNC balloon up to 18 atm 0% residual stenoses in prox RCA and mid OM1 Distal embolization in small caliber distal OM1 Recommend IV nitroglycerin 10 mics an hour overnight, and IV Aggrastat for 6 hours. Recommend DAPT with aspirin and Brilinta for 1 year MaNigel MormonMD Pager: [phone]: [phone] ECHOCARDIOGRAM COMPLETE  Result Date: 10/28/2021    ECHOCARDIOGRAM REPORT   [facility] Date of Exam: 10/28/2021 Medical Rec #:  [id number]  Height:       69.0 in Accession #:    [id number] Weight:       241.0 lb Date of Birth:  09/1965/05/03  BSA:          2.236 m Patient Age:    [age]     BP:           170/96 mmHg Patient Gender: M            HR:           62 bpm. Exam Location:  Inpatient Procedure: 2D Echo, Cardiac Doppler and Color Doppler Indications:    Chest Pain  History:        Patient has no prior history of Echocardiogram examinations.                 Signs/Symptoms:Chest Pain and Syncope; Risk                 Factors:Hypertension, Diabetes, Current Smoker and Sleep Apnea.  Sonographer:    NoGreer Pickereleferring Phys: [id number] CURATOLO  Sonographer Comments: Image acquisition challenging due to respiratory motion and Image acquisition challenging due to patient body habitus. IMPRESSIONS  1. Left ventricular ejection fraction, by estimation, is 55 to 60%. The left ventricle has normal function. The left ventricle has no regional wall motion abnormalities. Left ventricular diastolic parameters are indeterminate.  2. Right ventricular systolic function is normal. The right ventricular size is normal.  3. Left atrial size was mildly dilated.  4. The mitral valve is normal in structure. Mild mitral valve regurgitation. No evidence of mitral stenosis.  5. The aortic valve is normal in structure.  Aortic valve regurgitation is not visualized. No aortic stenosis is present. FINDINGS  Left Ventricle: Left ventricular ejection fraction, by estimation, is 55 to 60%. The left ventricle has normal function. The left ventricle has no regional wall motion abnormalities. The left ventricular internal cavity size was normal in size. There is  borderline left ventricular hypertrophy. Left ventricular diastolic parameters are indeterminate. Right Ventricle: The right ventricular size is normal. No increase in right ventricular wall thickness. Right ventricular systolic function is normal. Left Atrium: Left atrial size was mildly dilated. Right Atrium: Right atrial size was normal in size. Pericardium: There is no evidence of pericardial effusion. Mitral Valve: The mitral valve is normal in structure. Mild mitral valve regurgitation. No evidence of mitral valve stenosis. Tricuspid Valve: The tricuspid valve is normal in structure. Tricuspid valve regurgitation is not demonstrated. No evidence of tricuspid stenosis. Aortic Valve: The aortic valve is normal in structure. Aortic valve regurgitation is not visualized. No aortic stenosis is present. Pulmonic Valve: The pulmonic valve was normal in structure. Pulmonic valve regurgitation is  not visualized. No evidence of pulmonic stenosis. Aorta: The aortic root is normal in size and structure. IAS/Shunts: The interatrial septum was not assessed.  LEFT VENTRICLE PLAX 2D LVIDd:         4.80 cm   Diastology LVIDs:         3.40 cm   LV e' medial:    6.49 cm/s LV PW:         1.40 cm   LV E/e' medial:  11.8 LV IVS:        0.90 cm   LV e' lateral:   6.41 cm/s LVOT diam:     2.00 cm   LV E/e' lateral: 12.0 LV SV:         45 LV SV Index:   20 LVOT Area:     3.14 cm  RIGHT VENTRICLE RV Basal diam:  3.00 cm RV S prime:     11.90 cm/s TAPSE (M-mode): 1.8 cm LEFT ATRIUM           Index        RIGHT ATRIUM           Index LA diam:      3.20 cm 1.43 cm/m   RA Area:     13.70 cm LA Vol  (A4C): 83.5 ml 37.34 ml/m  RA Volume:   31.00 ml  13.86 ml/m  AORTIC VALVE LVOT Vmax:   70.50 cm/s LVOT Vmean:  44.100 cm/s LVOT VTI:    0.142 m  AORTA Ao Root diam: 3.50 cm Ao Asc diam:  3.80 cm MITRAL VALVE MV Area (PHT): 3.06 cm    SHUNTS MV Decel Time: 248 msec    Systemic VTI:  0.14 m MV E velocity: 76.70 cm/s  Systemic Diam: 2.00 cm MV A velocity: 88.30 cm/s MV E/A ratio:  0.87 Manish Patwardhan MD Electronically signed by Vernell Leep MD Signature Date/Time: 10/28/2021/10:58:53 AM    Final    CT HEAD WO CONTRAST (5MM)  Result Date: 10/27/2021 CLINICAL DATA:  Mental status change, unknown cause EXAM: CT HEAD WITHOUT CONTRAST TECHNIQUE: Contiguous axial images were obtained from the base of the skull through the vertex without intravenous contrast. RADIATION DOSE REDUCTION: This exam was performed according to the departmental dose-optimization program which includes automated exposure control, adjustment of the mA and/or kV according to patient size and/or use of iterative reconstruction technique. COMPARISON:  Brain MRI 02/21/2018 FINDINGS: Brain: No evidence of acute infarction, hemorrhage, hydrocephalus, extra-axial collection or mass lesion/mass effect. Vascular: No hyperdense vessel or unexpected calcification. Skull: Normal. Negative for fracture or focal lesion. Sinuses/Orbits: Mucosal thickening in the right frontal sinus and ethmoid air cells. Probable polyp or retention cyst in left maxillary sinus. Mild sinus disease in the right maxillary sinus. Other: None IMPRESSION: 1. No acute intracranial abnormality. 2. Mild paranasal sinus disease. Electronically Signed   By: Markus Daft M.D.   On: 10/27/2021 12:13   DG Chest 2 View  Result Date: 10/27/2021 CLINICAL DATA:  Heartburn. EXAM: CHEST - 2 VIEW COMPARISON:  None Available. FINDINGS: Cardiomegaly. The hila and mediastinum are unremarkable. Mild haziness in the bases is likely atelectasis. No overt edema. No pneumothorax. No other acute  abnormalities. IMPRESSION: Cardiomegaly without infiltrate or edema. Probable bibasilar subsegmental atelectasis. Electronically Signed   By: Dorise Bullion III M.D.   On: 10/27/2021 11:12     Discharge Instructions: Discharge Instructions     AMB Referral to Cardiac Rehabilitation - Phase II   Complete by: As directed  Diagnosis:  Coronary Stents NSTEMI     After initial evaluation and assessments completed: Virtual Based Care may be provided alone or in conjunction with Phase 2 Cardiac Rehab based on patient barriers.: Yes   Intensive Cardiac Rehabilitation (ICR) [place] location only OR Traditional Cardiac Rehabilitation (TCR) If criteria for ICR are not met will enroll in TCR [facility] only): Yes   Diet - low sodium heart healthy   Complete by: As directed    Diet - low sodium heart healthy   Complete by: As directed    Increase activity slowly   Complete by: As directed    Increase activity slowly   Complete by: As directed        Signed: Leigh Aurora, DO 10/29/2021, 2:36 PM   Pager: [phone]

## 2021-10-29 NOTE — Progress Notes (Signed)
10/29/21 0930  Clinical Encounter Type  Visited With Patient  Visit Type Initial (Advance Directive Education)  Referral From Physician;Nurse (Mauricio Gerome Apley, MD; Burman Riis T. Marcello Moores, RN)  Consult/Referral To Chaplain Melvenia Beam)  Recommendations Advance Directive Education   Chaplain responded to spiritual care page request for Advance Directive education with [name]. Met with [name] at patient's bedside.   [name] Stated that he does NOT have a court appointed [place].  [name] stated that he is NOT married.  [name] stated that he intends to list his Mother as his health care agent.    Chaplain provided the Advance Directive packet as well as education on Advance Directives-documents an individual completes to communicate their health care directions in advance of a time when they may need them. Chaplain informed [name] the documents which may be completed here in the hospital are the Living Will and [place].   Chaplain informed patient that the [place] is a legal document in which an individual names another person, as their [place], to communicate his health care decisions, when he, himself is not able to make them for himself. The Health Care Agent's function can be temporary or permanent depending on his ability to make and communicate those decisions independently.   Chaplain informed [name]  in the absence of a [place], the state of [place] directs health care providers to look to the following individuals in the order listed: legal guardian; an attorney-in-fact under a general power of attorney (POA) if that POA includes the right to make health care decisions; person's spouse; a 36 of his children; a 39 of adult brothers and sisters; or an individual who has an established relationship with you, who is acting in good  faith and who can convey your wishes.  If none of these persons are available or willing to make medical decisions on a patient's behalf, the law allows the patient's doctor to make decisions for them as long as another doctor agrees with those decisions.  Chaplain also informed the patient that the Health Care agent has no decision-making authority over any affairs other than those related to his medical care.   The chaplain further educated [name] that a Living Will is a legal document that allows his desires not to receive life-prolonging measures in the event that they have a condition that is incurable and will result in his death in a short period of time; they are unconscious, and doctors are confident that they will not regain consciousness; and/or they have advanced dementia or other substantial and irreversible loss of mental function.   The chaplain informed [name] that life-prolonging measures are medical treatments that would only serve to postpone death, including breathing machines, kidney dialysis, antibiotics, artificial nutrition and hydration (tube feeding), and similar forms of treatment and that if an individual is able to express their wishes, they may also make them known without the use of a Living Will, but in the event that he is not able to express his wishes, a Living Will allows medical providers and his family and friends to ensure that they are not making decisions on his behalf, but rather serving as his voice to convey the decisions that he has already made.   [name] is aware that the decision to create an advance directive is his alone and he may choose not  to complete the documents or may choose to complete one portion or both.  [name] was informed that he can revoke the documents at any time by striking through them and writing void or by completing new documents, but that it is also advisable that the individual verbally notify interested parties that his  wishes have changed.   [name] is also aware that the document must be signed in the presence of a notary public and two witnesses and that this can be done while the patient is still admitted to the hospital or after discharge in the community. If they decide to complete Advance Directives after being discharged from the hospital, they have been advised to notify all interested parties and to provide those documents to their physicians and loved ones in addition to bringing them to the hospital in the event of another hospitalization.   The chaplain informed the [name] that if he desires to proceed with completing Advance Directive Documentation while he is still admitted, notary services are sometimes available at [facility] between the hours of 1:00 and 3:30 Monday-Thursday. [name] did not have any additional questions and stated that he, and his son would complete the documents.   When the patient is ready to have these documents completed, the patient should request that their nurse place a spiritual care consult and indicate that the patient is ready to have their advance directives notarized so that arrangements for witnesses and notary public can be made.   Please page spiritual care if the patient desires further education or has questions.   [address] [place], M. Min., [phone].

## 2021-10-29 NOTE — Discharge Instructions (Addendum)
Here is what we discussed regarding your stay at the hospital.  1.  Regarding your chest pain, you have had a heart attack requiring 2 stents in your heart.  The stents can clot, and therefore we will place you on blood thinners.  You will be taking aspirin and Brilinta and Rosuvastatin 40 mg daily.  Please take your aspirin 81 mg 1 time a day.  Please take your Brilinta 90 mg twice daily.  We will also send you home on 10 mg amlodipine, losartan 100 mg daily, and spironolactone 25 mg daily.  The last medicine we will start you on a is metoprolol succinate, 25 mg daily.  Please take this daily.  2.  Please follow-up with your cardiologist on September 5th.   3.  I have made an appointment for you with the resident clinic on Sep 7th, 1:15, with Dr. Howie Ill, please follow-up.  4.  Please do not skip any medication doses, especially your blood thinner, as skipping medication doses can cause you to clot.  4.  If you develop any worsening chest pain, or shortness of breath and become diaphoretic, please return back to the emergency room to be evaluated.

## 2021-10-29 NOTE — Plan of Care (Signed)
Problem: Education: Goal: Ability to describe self-care measures that may prevent or decrease complications (Diabetes Survival Skills Education) will improve Outcome: Adequate for Discharge Goal: Individualized Educational Video(s) Outcome: Adequate for Discharge   Problem: Coping: Goal: Ability to adjust to condition or change in health will improve Outcome: Adequate for Discharge   Problem: Fluid Volume: Goal: Ability to maintain a balanced intake and output will improve Outcome: Adequate for Discharge   Problem: Health Behavior/Discharge Planning: Goal: Ability to identify and utilize available resources and services will improve Outcome: Adequate for Discharge Goal: Ability to manage health-related needs will improve Outcome: Adequate for Discharge   Problem: Metabolic: Goal: Ability to maintain appropriate glucose levels will improve Outcome: Adequate for Discharge   Problem: Nutritional: Goal: Maintenance of adequate nutrition will improve Outcome: Adequate for Discharge Goal: Progress toward achieving an optimal weight will improve Outcome: Adequate for Discharge   Problem: Skin Integrity: Goal: Risk for impaired skin integrity will decrease Outcome: Adequate for Discharge   Problem: Tissue Perfusion: Goal: Adequacy of tissue perfusion will improve Outcome: Adequate for Discharge   Problem: Education: Goal: Knowledge of General Education information will improve Description: Including pain rating scale, medication(s)/side effects and non-pharmacologic comfort measures Outcome: Adequate for Discharge   Problem: Health Behavior/Discharge Planning: Goal: Ability to manage health-related needs will improve Outcome: Adequate for Discharge   Problem: Clinical Measurements: Goal: Ability to maintain clinical measurements within normal limits will improve Outcome: Adequate for Discharge Goal: Will remain free from infection Outcome: Adequate for Discharge Goal:  Diagnostic test results will improve Outcome: Adequate for Discharge Goal: Respiratory complications will improve Outcome: Adequate for Discharge Goal: Cardiovascular complication will be avoided Outcome: Adequate for Discharge   Problem: Activity: Goal: Risk for activity intolerance will decrease Outcome: Adequate for Discharge   Problem: Nutrition: Goal: Adequate nutrition will be maintained Outcome: Adequate for Discharge   Problem: Coping: Goal: Level of anxiety will decrease Outcome: Adequate for Discharge   Problem: Elimination: Goal: Will not experience complications related to bowel motility Outcome: Adequate for Discharge Goal: Will not experience complications related to urinary retention Outcome: Adequate for Discharge   Problem: Pain Managment: Goal: General experience of comfort will improve Outcome: Adequate for Discharge   Problem: Safety: Goal: Ability to remain free from injury will improve Outcome: Adequate for Discharge   Problem: Skin Integrity: Goal: Risk for impaired skin integrity will decrease Outcome: Adequate for Discharge   Problem: Education: Goal: Understanding of CV disease, CV risk reduction, and recovery process will improve Outcome: Adequate for Discharge Goal: Individualized Educational Video(s) Outcome: Adequate for Discharge   Problem: Activity: Goal: Ability to return to baseline activity level will improve Outcome: Adequate for Discharge   Problem: Cardiovascular: Goal: Ability to achieve and maintain adequate cardiovascular perfusion will improve Outcome: Adequate for Discharge Goal: Vascular access site(s) Level 0-1 will be maintained Outcome: Adequate for Discharge   Problem: Health Behavior/Discharge Planning: Goal: Ability to safely manage health-related needs after discharge will improve Outcome: Adequate for Discharge   Problem: Education: Goal: Understanding of cardiac disease, CV risk reduction, and recovery  process will improve Outcome: Adequate for Discharge Goal: Individualized Educational Video(s) Outcome: Adequate for Discharge   Problem: Activity: Goal: Ability to tolerate increased activity will improve Outcome: Adequate for Discharge   Problem: Cardiac: Goal: Ability to achieve and maintain adequate cardiovascular perfusion will improve Outcome: Adequate for Discharge   Problem: Health Behavior/Discharge Planning: Goal: Ability to safely manage health-related needs after discharge will improve Outcome: Adequate for Discharge

## 2021-10-29 NOTE — Progress Notes (Addendum)
CARDIAC REHAB PHASE I   PRE:  Rate/Rhythm: 88 Sr  BP:  Supine: 176/107 recheck:168/107   SaO2: WNR  MODE:  Ambulation: 470 ft   POST:  Rate/Rhythm: 107 SR  BP:  Sitting: 170/105   SaO2: WNR  Pt to standing from EOB independently. Pt ambulated in hallway independently with standby assist if needed, tolerated well with no s/s. Pt educated on risk factors, exercise guidelines, angina/NTG, stent, ASA and brilinta, MI booklet, restrictions and site care, nutrition, smoking cessation, and orientation to [place] referral to [facility]. Pt receptive to education and all questions answered, pt left on EOB to eat breakfast with call bell in reach.  Dillon Verne, MS 10/29/2021 9:03 AM

## 2021-10-29 NOTE — Care Management (Signed)
10-29-21 1300 Case Manager received consult for PCP needs. Internal Medicine has scheduled a hospital follow up appointment for this patient. No further needs identified at this time.

## 2021-10-30 ENCOUNTER — Other Ambulatory Visit: Payer: Self-pay | Admitting: Student

## 2021-10-30 ENCOUNTER — Telehealth: Payer: Self-pay

## 2021-10-30 LAB — LIPOPROTEIN A (LPA): Lipoprotein (a): 220.5 nmol/L — ABNORMAL HIGH (ref <75.0)

## 2021-10-30 MED ORDER — METOPROLOL SUCCINATE ER 25 MG PO TB24: 25.0000 mg | ORAL_TABLET | Freq: Every day | ORAL | 11 refills | Status: DC

## 2021-10-30 NOTE — Telephone Encounter (Signed)
Transition Care Management Unsuccessful Follow-up Telephone Call  Date of discharge and from where:  10/29/2021, [facility]  Attempts:  1st Attempt  Reason for unsuccessful TCM follow-up call:  Unable to reach patient- I called [phone] twice and the recording stated that the call cannot be completed as dialed. I also called [phone] and the phone just rings.  Dillon Mire, NP @ [facility] is listed as PCP but the patient has not seen her in almost 3 years.  It is noted that he has an appointment at Internal Medicine -11/07/2021 for hospital follow up and to establish care.

## 2021-10-31 ENCOUNTER — Telehealth: Payer: Self-pay

## 2021-10-31 NOTE — Telephone Encounter (Signed)
Transition Care Management Unsuccessful Follow-up Telephone Call  Date of discharge and from where:  10/29/2021, [facility]  Attempts:  2nd Attempt  Reason for unsuccessful TCM follow-up call:  Unable to reach patient    I called [phone] and the recording continues to state that the call cannot be completed as dialed. I also called [phone] and the phone just rings.   Juluis Mire, NP @ [facility] is listed as PCP but the patient has not seen her in almost 3 years.  It is noted that he has an appointment at Internal Medicine -11/07/2021 for hospital follow up and to establish care.

## 2021-10-31 NOTE — Telephone Encounter (Signed)
2nd attempt : Called patient, no VM to leave a message.

## 2021-11-05 ENCOUNTER — Telehealth: Payer: Self-pay

## 2021-11-05 ENCOUNTER — Ambulatory Visit: Payer: Medicaid Other | Admitting: Cardiology

## 2021-11-05 NOTE — Telephone Encounter (Signed)
Transition Care Management Unsuccessful Follow-up Telephone Call  Date of discharge and from where:  10/29/2021, [facility]  Attempts:  3rd Attempt  Reason for unsuccessful TCM follow-up call:  Unable to reach patient I called [phone] and the recording continues to state that the call cannot be completed as dialed. I also called [phone] and the phone just rings.   Juluis Mire, NP @ [facility] is listed as PCP but the patient has not seen her in almost 3 years.  It is noted that he has an appointment at Internal Medicine -11/07/2021 for hospital follow up and to establish care.

## 2021-11-06 ENCOUNTER — Encounter: Payer: Self-pay | Admitting: Cardiology

## 2021-11-06 ENCOUNTER — Ambulatory Visit: Payer: Self-pay | Admitting: Cardiology

## 2021-11-06 VITALS — BP 134/88 | HR 96 | Resp 16 | Ht 69.0 in | Wt 242.0 lb

## 2021-11-06 DIAGNOSIS — I252 Old myocardial infarction: Secondary | ICD-10-CM | POA: Insufficient documentation

## 2021-11-06 DIAGNOSIS — F17210 Nicotine dependence, cigarettes, uncomplicated: Secondary | ICD-10-CM

## 2021-11-06 DIAGNOSIS — E1169 Type 2 diabetes mellitus with other specified complication: Secondary | ICD-10-CM | POA: Insufficient documentation

## 2021-11-06 DIAGNOSIS — E7841 Elevated Lipoprotein(a): Secondary | ICD-10-CM | POA: Insufficient documentation

## 2021-11-06 DIAGNOSIS — I10 Essential (primary) hypertension: Secondary | ICD-10-CM

## 2021-11-06 DIAGNOSIS — I2510 Atherosclerotic heart disease of native coronary artery without angina pectoris: Secondary | ICD-10-CM | POA: Insufficient documentation

## 2021-11-06 DIAGNOSIS — E782 Mixed hyperlipidemia: Secondary | ICD-10-CM | POA: Insufficient documentation

## 2021-11-06 HISTORY — DX: Elevated lipoprotein(a): E78.41

## 2021-11-06 MED ORDER — METOPROLOL SUCCINATE ER 25 MG PO TB24: 25.0000 mg | ORAL_TABLET | Freq: Every day | ORAL | 3 refills | Status: DC

## 2021-11-06 MED ORDER — ASPIRIN 81 MG PO CHEW: 81.0000 mg | CHEWABLE_TABLET | Freq: Every day | ORAL | 3 refills | Status: DC

## 2021-11-06 MED ORDER — TICAGRELOR 90 MG PO TABS: 90.0000 mg | ORAL_TABLET | Freq: Two times a day (BID) | ORAL | 3 refills | Status: DC

## 2021-11-06 MED ORDER — REPATHA SURECLICK 140 MG/ML ~~LOC~~ SOAJ: 140.0000 mg | SUBCUTANEOUS | 5 refills | Status: DC

## 2021-11-06 MED ORDER — NICOTINE POLACRILEX 2 MG MT GUM: 2.0000 mg | CHEWING_GUM | OROMUCOSAL | 3 refills | Status: DC | PRN

## 2021-11-06 MED ORDER — ROSUVASTATIN CALCIUM 20 MG PO TABS: 20.0000 mg | ORAL_TABLET | Freq: Every day | ORAL | 2 refills | Status: DC

## 2021-11-06 NOTE — Progress Notes (Signed)
Follow up visit  Subjective:   Dillon Wang, male    DOB: 1965/11/16, 57 y.o.   MRN: [id number]   HPI  Chief Complaint  Patient presents with   Chest Pain   Hospitalization Follow-up   New Patient (Initial Visit)    56 y.o. African American male  with hypertension, hyperlipidemia, type 2 DM, obesity, OSA, CAD, h/o polysubstance abuse, h/o prostate cancer, h/o seizures  Patient is doing well. He denies chest pain, shortness of breath, palpitations, leg edema, orthopnea, PND, TIA/syncope. Reviewed recent test results with the patient, details below.     Current Outpatient Medications:    albuterol (VENTOLIN HFA) 108 (90 Base) MCG/ACT inhaler, Inhale 2 puffs into the lungs every 4 (four) hours as needed for wheezing or shortness of breath., Disp: 18 g, Rfl: 2   amLODipine (NORVASC) 10 MG tablet, Take 1 tablet (10 mg total) by mouth daily., Disp: 30 tablet, Rfl: 5   aspirin 81 MG chewable tablet, Chew 1 tablet (81 mg total) by mouth daily., Disp: 30 tablet, Rfl: 11   budesonide-formoterol (SYMBICORT) 160-4.5 MCG/ACT inhaler, Inhale 2 puffs into the lungs 2 (two) times daily., Disp: 10.2 g, Rfl: 3   furosemide (LASIX) 40 MG tablet, Take 1 tablet (40 mg total) by mouth daily., Disp: 30 tablet, Rfl: 5   levETIRAcetam (KEPPRA) 750 MG tablet, Take 1 tablet (750 mg total) by mouth 2 (two) times daily., Disp: 60 tablet, Rfl: 5   losartan (COZAAR) 100 MG tablet, Take 1 tablet (100 mg total) by mouth daily., Disp: 30 tablet, Rfl: 5   metFORMIN (GLUCOPHAGE-XR) 500 MG 24 hr tablet, Take 2 tablets (1,000 mg total) by mouth daily with breakfast., Disp: 60 tablet, Rfl: 3   metoprolol succinate (TOPROL XL) 25 MG 24 hr tablet, Take 1 tablet (25 mg total) by mouth daily., Disp: 30 tablet, Rfl: 11   Misc. Devices MISC, Auto bipap with Medium size [employer] Full Face Mask Simplus mask/ heated humidification. Autopap settings 15-20 cm H2O., Disp: 1 each, Rfl: 0   nitroGLYCERIN (NITROSTAT) 0.4 MG SL  tablet, Place 1 tablet (0.4 mg total) under the tongue every 5 (five) minutes as needed for chest pain., Disp: 30 tablet, Rfl: 12   potassium chloride SA (KLOR-CON M) 20 MEQ tablet, Take 1 tablet (20 mEq total) by mouth daily., Disp: 30 tablet, Rfl: 3   rosuvastatin (CRESTOR) 40 MG tablet, Take 1 tablet (40 mg total) by mouth daily., Disp: 30 tablet, Rfl: 5   spironolactone (ALDACTONE) 25 MG tablet, Take 1 tablet (25 mg total) by mouth daily., Disp: 30 tablet, Rfl: 5   ticagrelor (BRILINTA) 90 MG TABS tablet, Take 1 tablet (90 mg total) by mouth 2 (two) times daily., Disp: 60 tablet, Rfl: 5   Cardiovascular & other pertient studies:  Reviewed external labs and tests, independently interpreted  EKG 11/06/2021: Sinus rhythm 93 bpm  Old inferior infarct   EKG 10/29/2021: Sinus rhythm Inferior infarct, age indeterminate   Echocardiogram 10/28/2021:  1. Left ventricular ejection fraction, by estimation, is 55 to 60%. The  left ventricle has normal function. The left ventricle has no regional  wall motion abnormalities. Left ventricular diastolic parameters are  indeterminate.   2. Right ventricular systolic function is normal. The right ventricular  size is normal.   3. Left atrial size was mildly dilated.   4. The mitral valve is normal in structure. Mild mitral valve  regurgitation. No evidence of mitral stenosis.   5. The aortic valve is normal  in structure. Aortic valve regurgitation is  not visualized. No aortic stenosis is present.    Coronary intervention 10/29/2021: LM: Normal LAD: Ostial to prox diffuse 40% disease Lcx: Large vessel        Mid OM1 (Non-culprit) tandem 60%-80% stenoses RCA: Prox 95% stenosis (Culprit)         Disal focal 60% disease   Successful percutaneous coronary intervention prox RCA and mid OM1        PTCA and stent placement 3.5 X 28 mm Synergy drug-eluting stent-->post dilatation with 4.69m [place] balloon up to 18 atm        PTCA and stent placement 4.0 X  20 mm Synergy drug-eluting stent-->post dilatation with 4.041mNC balloon up to 18 atm   0% residual stenoses in prox RCA and mid OM1 Distal embolization in small caliber distal OM1   Recommend IV nitroglycerin 10 mics an hour overnight, and IV Aggrastat for 6 hours. Recommend DAPT with aspirin and Brilinta for 1 year  Recent labs: 10/29/2021: Glucose 145, BUN/Cr 10/1.19. EGFR >60. Na/K 133/3.2.  H/H 17/51. MCV 90. Platelets 189 HbA1C 6.6% Chol 193, TG 95, HDL 55, LDL 119 Lipoprotein (a) 220 Trop HS peak 4200    Review of Systems  Cardiovascular:  Negative for chest pain, dyspnea on exertion, leg swelling, palpitations and syncope.         Vitals:   11/06/21 1458  BP: 134/88  Pulse: 96  Resp: 16  SpO2: 96%    Body mass index is 35.74 kg/m. Filed Weights   11/06/21 1458  Weight: 242 lb (109.8 kg)     Objective:   Physical Exam Vitals and nursing note reviewed.  Constitutional:      General: He is not in acute distress. Neck:     Vascular: No JVD.  Cardiovascular:     Rate and Rhythm: Normal rate and regular rhythm.     Heart sounds: Normal heart sounds. No murmur heard. Pulmonary:     Effort: Pulmonary effort is normal.     Breath sounds: Normal breath sounds. No wheezing or rales.             Visit diagnoses:   ICD-10-CM   1. Coronary artery disease involving native coronary artery of native heart without angina pectoris  I25.10 EKG 12-Lead    Evolocumab (REPATHA SURECLICK) 14284G/ML SOAJ    Lipid panel    Lipoprotein A (LPA)    nicotine polacrilex (NICORETTE) 2 MG gum    2. Elevated lipoprotein(a)  E78.41 Evolocumab (REPATHA SURECLICK) 14132G/ML SOAJ    Lipid panel    Lipoprotein A (LPA)    3. Mixed hyperlipidemia  E78.2 Evolocumab (REPATHA SURECLICK) 14440G/ML SOAJ    Lipid panel    Lipoprotein A (LPA)    4. Hypertension, unspecified type  I10 aspirin 81 MG chewable tablet    5. Nicotine dependence, cigarettes, uncomplicated  F1N02.725nicotine polacrilex (NICORETTE) 2 MG gum       Orders Placed This Encounter  Procedures   Lipid panel   Lipoprotein A (LPA)   EKG 12-Lead    Meds ordered this encounter  Medications   Evolocumab (REPATHA SURECLICK) 14366G/ML SOAJ    Sig: Inject 140 mg into the skin every 14 (fourteen) days.    Dispense:  6 mL    Refill:  5   aspirin 81 MG chewable tablet    Sig: Chew 1 tablet (81 mg total) by mouth daily.    Dispense:  90 tablet    Refill:  3   metoprolol succinate (TOPROL XL) 25 MG 24 hr tablet    Sig: Take 1 tablet (25 mg total) by mouth daily.    Dispense:  90 tablet    Refill:  3   ticagrelor (BRILINTA) 90 MG TABS tablet    Sig: Take 1 tablet (90 mg total) by mouth 2 (two) times daily.    Dispense:  180 tablet    Refill:  3   rosuvastatin (CRESTOR) 20 MG tablet    Sig: Take 1 tablet (20 mg total) by mouth daily.    Dispense:  90 tablet    Refill:  2   nicotine polacrilex (NICORETTE) 2 MG gum    Sig: Take 1 each (2 mg total) by mouth as needed for smoking cessation.    Dispense:  60 tablet    Refill:  3     Assessment & Recommendations:   56 y.o. African American male  with hypertension, hyperlipidemia, type 2 DM, obesity, OSA, CAD, h/o polysubstance abuse, h/o prostate cancer, h/o seizures  CAD: NSTEMI 10/2021 Successful PCI to proximal circumflex and mid OM1. Distal embolization of small caliber distal OM1, periprocedural infarct with peak HS tro 4200 Recommend DAPT with aspirin and Brilinta for 1 year. Cautious introduction of metoprolol tartrate 25 mg bid. Counseled regarding ongoing cocaine use. Continue losartan 100 mg daily, amlodipine 10 mg daily. LDL 119, lipoprotein (a) 220. Reduce Crestor to 20 mg daily, instead add Repatha. Re-emphazied importance of cocaine cessation. Prescribed nicotine gum-currently smokes 2 cigarettes/day. Nigel Mormon, MD Pager: [phone] Office: [phone]

## 2021-11-07 ENCOUNTER — Encounter: Payer: Medicaid Other | Admitting: Internal Medicine

## 2021-11-07 NOTE — Progress Notes (Deleted)
hFU Dc8/29  NSTEMI LHC 8/28 showed 95% proximal RCA stenosis and 60-80% stenosis of mid OM1. 2 DES placed.   Medications: ASA '81mg'$ , ticagrelor 90 mg BID, rosuvastatin 40 mg qd   HTN Medications: losartan 100, metoprolol 25 mg, spironolactone 25 mg   HLD   DM

## 2021-11-13 ENCOUNTER — Telehealth (HOSPITAL_COMMUNITY): Payer: Self-pay | Admitting: *Deleted

## 2021-11-13 NOTE — Telephone Encounter (Signed)
Referral for Cardiac Rehab phase II sent to [facility] per pt request. Maurice Small RN, BSN Cardiac and Pulmonary Rehab Nurse Navigator

## 2021-11-21 ENCOUNTER — Encounter: Payer: Self-pay | Admitting: Internal Medicine

## 2021-11-21 NOTE — Progress Notes (Deleted)
New pt HFU for NSTEMI  PMH: Diabetes, htn, hld, gerd, osa, seizures, testicular cancer   Asa 81 mg Ticagrelor 90 BID Losartan 100 mg Metoprolol 25 mg qd Spironolactone 25 mg Rosuvastatin 40 mg  Surgical history   NSTEMI Presented to hospital 8/27 with chest pain. Troponin elevated to 116. Left and right heart cath 8/28 showed proximal 95% stenosis of RCA requiing DES and 60-80% stenosis of mid OM1 requiring DES. Followed up with cardiology with Dr. Virgina Jock 9/6. Discharge medications include aspirin 81 mg qd, ticagrelo 90 BID, metoprolol succinate 25 mg qd. Plan to continue DAPT uintil 10/2022.   HTN Started on amlodipine 10 mg, spironolactone 25 mg, and losartan 100 mg in hospital  HLD Started on rosuvastatin 40 mg. Reduced to crestor 20 and repatha added  DM A1c at 6.8. Metformin 100 mg BID started at discharge. Recheck A1c 11/23.  Seizure Keppra 750 ng BID  EtOH Reported drinking `1 pack of beer daily  Tobacco  Cocaine use Drug screen positive for cocain and ethanol level 151

## 2021-11-27 ENCOUNTER — Ambulatory Visit (INDEPENDENT_AMBULATORY_CARE_PROVIDER_SITE_OTHER): Payer: Self-pay | Admitting: Internal Medicine

## 2021-11-27 ENCOUNTER — Other Ambulatory Visit: Payer: Self-pay

## 2021-11-27 ENCOUNTER — Other Ambulatory Visit (HOSPITAL_COMMUNITY): Payer: Self-pay

## 2021-11-27 ENCOUNTER — Encounter: Payer: Self-pay | Admitting: Internal Medicine

## 2021-11-27 VITALS — BP 118/67 | HR 84 | Temp 98.1°F | Ht 69.0 in | Wt 250.9 lb

## 2021-11-27 DIAGNOSIS — F17210 Nicotine dependence, cigarettes, uncomplicated: Secondary | ICD-10-CM

## 2021-11-27 DIAGNOSIS — I2510 Atherosclerotic heart disease of native coronary artery without angina pectoris: Secondary | ICD-10-CM

## 2021-11-27 DIAGNOSIS — Z5989 Other problems related to housing and economic circumstances: Secondary | ICD-10-CM

## 2021-11-27 DIAGNOSIS — I10 Essential (primary) hypertension: Secondary | ICD-10-CM

## 2021-11-27 MED ORDER — SPIRONOLACTONE 25 MG PO TABS
25.0000 mg | ORAL_TABLET | Freq: Every day | ORAL | 5 refills | Status: DC
  Filled 2021-11-27: qty 30, 30d supply, fill #0

## 2021-11-27 MED ORDER — TICAGRELOR 90 MG PO TABS
90.0000 mg | ORAL_TABLET | Freq: Two times a day (BID) | ORAL | 3 refills | Status: DC
  Filled 2021-11-27: qty 60, 30d supply, fill #0

## 2021-11-27 MED ORDER — ASPIRIN 81 MG PO CHEW
81.0000 mg | CHEWABLE_TABLET | Freq: Every day | ORAL | 3 refills | Status: DC
  Filled 2021-11-27: qty 90, 90d supply, fill #0

## 2021-11-27 MED ORDER — METOPROLOL TARTRATE 25 MG PO TABS
25.0000 mg | ORAL_TABLET | Freq: Two times a day (BID) | ORAL | 11 refills | Status: DC
  Filled 2021-11-27: qty 60, 30d supply, fill #0

## 2021-11-27 MED ORDER — ROSUVASTATIN CALCIUM 20 MG PO TABS
20.0000 mg | ORAL_TABLET | Freq: Every day | ORAL | 2 refills | Status: DC
  Filled 2021-11-27: qty 30, 30d supply, fill #0
  Filled 2022-01-02: qty 30, 30d supply, fill #1
  Filled 2022-01-29: qty 30, 30d supply, fill #2

## 2021-11-27 MED ORDER — LOSARTAN POTASSIUM 100 MG PO TABS
100.0000 mg | ORAL_TABLET | Freq: Every day | ORAL | 5 refills | Status: DC
  Filled 2021-11-27: qty 30, 30d supply, fill #0
  Filled 2022-01-29: qty 30, 30d supply, fill #1

## 2021-11-27 MED ORDER — FUROSEMIDE 40 MG PO TABS
40.0000 mg | ORAL_TABLET | Freq: Every day | ORAL | 11 refills | Status: DC
  Filled 2021-11-27: qty 30, 30d supply, fill #0
  Filled 2022-01-02: qty 30, 30d supply, fill #1
  Filled 2022-01-29: qty 30, 30d supply, fill #2

## 2021-11-27 NOTE — Assessment & Plan Note (Signed)
Patient presenting for HFU s/p PCI with stenting. He was discharged with appropriate medicines given to him at bedside from [facility]. Today, patient is reporting that he does not have his medicines. The only prescription he has at home is Pattricia Boss which was given to him by Dr. Virgina Jock in the form of samples. He does not have insurance nor has he completed orange card/cafa paperwork, and reports that he would not be able to afford all of his medicines, even if sent through 4$ IM program.  Fortunately, he has not been having any chest pain and no SOB. He also reports that since discharge he has not used any cocaine and has cut his smoking down to 3 cigarettes per day from 1/2ppd.  Blood pressure 118/67, pulse 84, temperature 98.1 F (36.7 C), temperature source Oral, height '5\' 9"'$  (1.753 m), weight 250 lb 14.4 oz (113.8 kg), SpO2 96 %. On exam, he is resting comfortably. Heart is RRR, lungs CTAB. There is trace pitting edema in his bilateral legs.  I have serious concerns about this patient's health literacy. He seems to have difficulty understanding which medicines he needs to take and for what reasons. Repeatedly asked what each medicine did. I spent considerable amount of time discussing the importance of each medicine and the purpose of each as well. Recommended to the patient that he take his Brillinta and aspirin if nothing else. Unfortunately, brillinta is not available through the 4$ program. I have sent the remaining medicines to [facility] IM program.  He will plan to fill all prescriptions once for no up-front cost. Also advised he contact Dr. Bonney Roussel office for additional samples. In the meantime, he will work on getting orange car paperwork filled out. Referral to social work placed for further assistance.  - Continue aspirin 81, ticagrelor '90mg'$  BID, losartan '100mg'$ , metoprolol '25mg'$ , spiro 25, rosuvastatin '40mg'$ , lasix '40mg'$  - social work referral - 69monthf/u

## 2021-11-27 NOTE — Patient Instructions (Addendum)
Dear Dillon Wang,  Thank you for trusting [place] with your care today.   We talked about your heart disease. I am glad that you have cut back on smoking and have not used cocaine since your hospitalization.  I have sent some medicines to our outpatient pharmacy through the IM program. Please take the aspirin and Brilinta. These are the two most important medicines. Please also take your metoprolol, this is the next most important medicine. The other medicines are important as well, and I recommend that you take them if you have them. I have placed a referral to social work to help with medication affordability. Lastly, please call Dr. Virgina Jock to ask for more Brilinta samples. Please return to clinic in 1 month to check back in.

## 2021-11-27 NOTE — Progress Notes (Signed)
   CC: hospital follow up  HPI:[name] is a 56 y.o. male who presents for evaluation of CAD. Please see individual problem based A/P for details.  Patient presenting for HFU s/p PCI with stenting. He was discharged with appropriate medicines given to him at bedside from [facility]. Today, patient is reporting that he does not have his medicines. The only prescription he has at home is Pattricia Boss which was given to him by Dr. Virgina Jock in the form of samples. He does not have insurance nor has he completed orange card/cafa paperwork, and reports that he would not be able to afford all of his medicines, even if sent through 4$ IM program.  Fortunately, he has not been having any chest pain and no SOB. He also reports that since discharge he has not used any cocaine and has cut his smoking down to 3 cigarettes per day from 1/2ppd.  Depression, PHQ-9: Based on the patients  [place] Visit from 03/04/2018 in [place]  PHQ-9 Total Score 0      score we have .  Past Medical History:  Diagnosis Date   Cancer [facility])    prostate   Diabetes mellitus without complication ([place])    GERD (gastroesophageal reflux disease)    Hyperlipidemia    Hypertension    Obstructive sleep apnea 09/20/2017   Seizures ([place])    Sleep apnea    Tussive syncope 08/05/2018   Review of Systems:   Review of Systems  Constitutional: Negative.   Respiratory: Negative.    Cardiovascular: Negative.   Gastrointestinal: Negative.   Neurological: Negative.      Physical Exam: There were no vitals filed for this visit.   General: NAD HEENT: Conjunctiva nl , antiicteric sclerae, moist mucous membranes, no exudate or erythema Cardiovascular: Normal rate, regular rhythm.  No murmurs, rubs, or gallops, trace bilateral pitting edema Pulmonary : Equal breath sounds, No wheezes, rales, or rhonchi Abdominal: soft, nontender,  bowel sounds present Ext: no tenderness to palpation of lower  extremities.   Assessment & Plan:   See Encounters Tab for problem based charting.   I have serious concerns about this patient's health literacy. He seems to have difficulty understanding which medicines he needs to take and for what reasons. Repeatedly asked what each medicine did. I spent considerable amount of time discussing the importance of each medicine and the purpose of each as well. Recommended to the patient that he take his Brillinta and aspirin if nothing else. Unfortunately, brillinta is not available through the 4$ program. I have sent the remaining medicines to [facility] IM program.  He will plan to fill all prescriptions once for no up-front cost. Also advised he contact Dr. Bonney Roussel office for additional samples. In the meantime, he will work on getting orange car paperwork filled out. Referral to social work placed for further assistance.  - Continue aspirin 81, ticagrelor '90mg'$  BID, losartan '100mg'$ , metoprolol '25mg'$ , spiro 25, rosuvastatin '40mg'$ , lasix '40mg'$  - social work referral - 80monthf/u  Patient discussed with Dr.  LSaverio Danker

## 2021-11-29 ENCOUNTER — Other Ambulatory Visit (HOSPITAL_COMMUNITY): Payer: Self-pay

## 2021-12-01 NOTE — Progress Notes (Signed)
Internal Medicine Clinic Attending ° °Case discussed with Dr. Gawaluck  At the time of the visit.  We reviewed the resident’s history and exam and pertinent patient test results.  I agree with the assessment, diagnosis, and plan of care documented in the resident’s note.  °

## 2021-12-25 ENCOUNTER — Other Ambulatory Visit: Payer: Self-pay

## 2021-12-25 ENCOUNTER — Encounter: Payer: Self-pay | Admitting: Internal Medicine

## 2021-12-25 ENCOUNTER — Other Ambulatory Visit (HOSPITAL_COMMUNITY): Payer: Self-pay

## 2021-12-25 ENCOUNTER — Ambulatory Visit (INDEPENDENT_AMBULATORY_CARE_PROVIDER_SITE_OTHER): Payer: Self-pay | Admitting: Internal Medicine

## 2021-12-25 VITALS — BP 127/75 | HR 72 | Temp 97.9°F | Ht 69.0 in | Wt 257.0 lb

## 2021-12-25 DIAGNOSIS — F17200 Nicotine dependence, unspecified, uncomplicated: Secondary | ICD-10-CM

## 2021-12-25 DIAGNOSIS — I10 Essential (primary) hypertension: Secondary | ICD-10-CM

## 2021-12-25 DIAGNOSIS — N179 Acute kidney failure, unspecified: Secondary | ICD-10-CM

## 2021-12-25 DIAGNOSIS — Z23 Encounter for immunization: Secondary | ICD-10-CM

## 2021-12-25 DIAGNOSIS — E782 Mixed hyperlipidemia: Secondary | ICD-10-CM

## 2021-12-25 DIAGNOSIS — E1165 Type 2 diabetes mellitus with hyperglycemia: Secondary | ICD-10-CM

## 2021-12-25 DIAGNOSIS — E876 Hypokalemia: Secondary | ICD-10-CM

## 2021-12-25 DIAGNOSIS — R569 Unspecified convulsions: Secondary | ICD-10-CM

## 2021-12-25 DIAGNOSIS — Z7984 Long term (current) use of oral hypoglycemic drugs: Secondary | ICD-10-CM

## 2021-12-25 DIAGNOSIS — I214 Non-ST elevation (NSTEMI) myocardial infarction: Secondary | ICD-10-CM

## 2021-12-25 DIAGNOSIS — F17210 Nicotine dependence, cigarettes, uncomplicated: Secondary | ICD-10-CM

## 2021-12-25 DIAGNOSIS — I252 Old myocardial infarction: Secondary | ICD-10-CM

## 2021-12-25 DIAGNOSIS — Z0000 Encounter for general adult medical examination without abnormal findings: Secondary | ICD-10-CM

## 2021-12-25 MED ORDER — LEVETIRACETAM 750 MG PO TABS
750.0000 mg | ORAL_TABLET | Freq: Two times a day (BID) | ORAL | 5 refills | Status: DC
  Filled 2021-12-25: qty 60, 30d supply, fill #0
  Filled 2022-01-29: qty 60, 30d supply, fill #1

## 2021-12-25 MED ORDER — ASPIRIN 81 MG PO CHEW
81.0000 mg | CHEWABLE_TABLET | Freq: Every day | ORAL | 3 refills | Status: DC
  Filled 2021-12-25: qty 90, 90d supply, fill #0

## 2021-12-25 NOTE — Progress Notes (Unsigned)
Subjective:  CC: follow-up after PCI placement   HPI:  Dillon Wang is a 56 y.o. male with a past medical history stated below and presents today for follow-up after hospitalization in August due to NSTEMI.  He had 2 drug-eluting stents that were placed.  He followed up with our clinic last month and has had significant difficulties with obtaining medications due to cost. Please see problem based assessment and plan for additional details.  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 18 g 2   amLODipine (NORVASC) 10 MG tablet Take 1 tablet (10 mg total) by mouth daily. 30 tablet 5   budesonide-formoterol (SYMBICORT) 160-4.5 MCG/ACT inhaler Inhale 2 puffs into the lungs 2 (two) times daily. 10.2 g 3   Evolocumab (REPATHA SURECLICK) 924 MG/ML SOAJ Inject 140 mg into the skin every 14 (fourteen) days. 6 mL 5   furosemide (LASIX) 40 MG tablet Take 1 tablet (40 mg total) by mouth daily. 30 tablet 11   losartan (COZAAR) 100 MG tablet Take 1 tablet (100 mg total) by mouth daily. 30 tablet 5   metFORMIN (GLUCOPHAGE-XR) 500 MG 24 hr tablet Take 2 tablets (1,000 mg total) by mouth daily with breakfast. 60 tablet 3   metoprolol succinate (TOPROL XL) 25 MG 24 hr  tablet Take 1 tablet (25 mg total) by mouth daily. 90 tablet 3   Misc. Devices MISC Auto bipap with Medium size [employer] Full Face Mask Simplus mask/ heated humidification. Autopap settings 15-20 cm H2O. 1 each 0   nicotine polacrilex (NICORETTE) 2 MG gum Take 1 each (2 mg total) by mouth as needed for smoking cessation. 60 tablet 3   nitroGLYCERIN (NITROSTAT) 0.4 MG SL tablet Place 1 tablet (0.4 mg total) under the tongue every 5 (five) minutes as needed for chest pain. 30 tablet 12   potassium chloride SA (KLOR-CON M) 20 MEQ tablet Take 1 tablet (20 mEq total) by mouth daily. 30 tablet 3   rosuvastatin (CRESTOR) 20 MG tablet Take 1 tablet (20 mg total) by mouth daily. 90 tablet 2   spironolactone (ALDACTONE) 25 MG tablet Take 1 tablet (25 mg total) by mouth daily. 30 tablet 5   ticagrelor (BRILINTA) 90 MG TABS tablet Take 1 tablet (90 mg total) by mouth 2 (two) times daily. 180 tablet 3   No current facility-administered medications on file prior to visit.    Family History  Problem Relation Age of Onset   Hypertension Mother    Seizures Father    Seizures Brother    Diabetes Maternal Aunt    Colon cancer Neg Hx    Esophageal cancer Neg Hx    Rectal cancer Neg Hx    Stomach cancer  Neg Hx     Social History   Socioeconomic History   Marital status: Single    Spouse name: Not on file   Number of children: Not on file   Years of education: Not on file   Highest education level: Not on file  Occupational History   Not on file  Tobacco Use   Smoking status: Some Days    Packs/day: 0.50    Types: Cigarettes   Smokeless tobacco: Never   Tobacco comments:    2 cigarettes a day  Vaping Use   Vaping Use: Never used  Substance and Sexual Activity   Alcohol use: Yes    Comment: beer every couple of days   Drug use: Not Currently    Types: Cocaine    Comment: sober 6 months   Sexual activity: Not on file  Other Topics Concern   Not on file  Social History Narrative    Not on file   Social Determinants of Health   Financial Resource Strain: Not on file  Food Insecurity: Not on file  Transportation Needs: Not on file  Physical Activity: Not on file  Stress: Not on file  Social Connections: Not on file  Intimate Partner Violence: Not on file    Review of Systems: ROS negative except for what is noted on the assessment and plan.  Objective:   Vitals:   12/25/21 0930  BP: 127/75  Pulse: 72  Temp: 97.9 F (36.6 C)  TempSrc: Oral  SpO2: 100%  Weight: 257 lb (116.6 kg)  Height: '5\' 9"'$  (1.753 m)    Physical Exam: Constitutional: well-appearing Cardiovascular: regular rate and rhythm, no m/r/g Pulmonary/Chest: normal work of breathing on room air, lungs clear to auscultation bilaterally MSK: No edema in lower extremities bilaterally Neurological: normal gait   Assessment & Plan:  History of non-ST elevation myocardial infarction (NSTEMI) Patient following up with history of 2 drug-eluting stents placed to RCA in August 2023.  He follows with Dr. Virgina Jock for cardiology.  He needs to remain on aspirin and ticagrelor for 1 year.  He continues to have difficulties with obtaining medications due to cost.  He has been getting Ticagrelor samples through cardiology office.  He denies using cocaine.  Over the last few weeks, he is having some chest pain when he is doing yard work.  He denies chest pain with walking and currently.  Denies shortness of breath. Assessment: Patient presenting with recent PCI 2 months ago who is now having signs of angina with yard work.  He denies current chest pain. Plan: Patient was given funds to go to [facility] outpatient program to get month supply of aspirin.  Currently has samples of ticagrelor I sent a message to Dr. Virgina Jock in regards to chest pain and ticagrelor samples.  If he is not able to continue to receive samples an alternative would be to switch patient to clopidogrel which is on the IM program and  would be $4 a month. -Referral placed to community social work to help with applying for Medicaid. -Follow-up in 4 weeks  Mixed hyperlipidemia He follows with history of NSTEMI in 2023.  He follows with cardiology. Lab Results  Component Value Date   CHOL 193 10/28/2021   HDL 55 10/28/2021   LDLCALC 119 (H) 10/28/2021   TRIG 95 10/28/2021   CHOLHDL 3.5 10/28/2021   Medications were switched from rosuvastatin 40 mg to rosuvastatin 20 mg with addition of Repatha.  Patient has not been able to  pick up Repatha and has been adherent with taking rosuvastatin.  He does not currently have insurance coverage and I do not think that Repatha is an attainable medication for him at this time. Plan: Repeat lipid panel to following November 9, 6 weeks after initiating statin therapy.  Has been adherent with rosuvastatin, but has not been able to obtain Repatha.  If LDL remains greater than goal for secondary prevention less than 70 would consider increasing rosuvastatin back up to 40 mg.  Seizures ([place]) Patient with history of seizures.  He has been taking Keppra 750 mg twice daily for several years.  He has never been able to establish care with neurology due to lack of insurance.  He has been out of Keppra for the last 10 days and denies seizure episode. A/P: I called and talked with [facility].  Keppra is on their IM program $4 list.  She was giving funds for 1 month supply of Keppra.  Hypokalemia BMP at hospital discharge of 3.2.  Patient has been on multiple medications that could affect his potassium including losartan, renal lactone, and Lasix. A/P: Recheck BMP  Addendum K wnl at 4.2  Type 2 diabetes mellitus with hyperglycemia, without long-term current use of insulin (HCC) Hemoglobin A1c was completed in August at 6.6.  He has not been able to afford metformin.  He is not having any side effects from this medication. A/P: I talked with patient about lifestyle  modifications that he can make including dietary changes and increasing exercise.  He did note that with yard work he is having some chest pain.  -Repeat hemoglobin A1c at follow-up in 4 weeks  Healthcare maintenance Flu shot received today  Tobacco use disorder He is not currently able to afford nicotine gum.  We will plan to focus on resources for tobacco cessation at follow-up visit in 4 weeks.  AKI (acute kidney injury) ([place]) Addendum: BMP showed potassium within normal limits at 4.2.  However creatinine is elevated at 1.3.  Baseline creatinine appears to be around 1.  Called and spoke with patient.  He has been urinating normally.  He denies orthopnea, PND, and has not noted swelling in his ankles.  His weight is stable from hospital discharge.  I think that creatinine elevation is likely related to losartan, spironolactone or Lasix initiation.  States that he only has 4 more pills left of each medication.  Ask him to hold losartan and spironolactone and to follow-up for a lab only visit to repeat BMP next week.  -Front desk contacted to schedule patient for lab only visit -BMP order placed for future visit   Patient discussed with Dr. Walden Field Rayden Scheper, D.O. [facility]  PGY-2 Pager: [phone]  Phone: [phone] Date 12/26/2021  Time 10:35 AM

## 2021-12-25 NOTE — Patient Instructions (Signed)
Thank you, Dillon Wang for allowing [place] to provide your care today.   Heart attack with stent The 2 most important medications for you are aspirin and brilinta. You were given funds to cover aspirin. I am messaging your cardiologist to make sure they can keep supplying Brilinta.  Seizures I have sent in [place] to [place].  Blood pressure Your blood pressure looked good with losartan and amlodipine. We need to work on Print production planner so that medications are affordable.  Diabetes Your sugars are high. I will message the social worker to see about the medicaid coverage.   Low potassium I will call with results.  I have ordered the following labs for you:   Lab Orders         BMP8+Anion Gap      Referrals ordered today:   Referral Orders  No referral(s) requested today     I have ordered the following medication/changed the following medications:   Stop the following medications: Medications Discontinued During This Encounter  Medication Reason   metoprolol tartrate (LOPRESSOR) 25 MG tablet Change in therapy   levETIRAcetam (KEPPRA) 750 MG tablet Reorder   aspirin 81 MG chewable tablet Reorder     Start the following medications: Meds ordered this encounter  Medications   aspirin 81 MG chewable tablet    Sig: Chew 1 tablet (81 mg total) by mouth daily.    Dispense:  90 tablet    Refill:  3    IM program   levETIRAcetam (KEPPRA) 750 MG tablet    Sig: Take 1 tablet (750 mg total) by mouth 2 (two) times daily.    Dispense:  60 tablet    Refill:  5     Follow up:  1 month    We look forward to seeing you next time. Please call our clinic at [phone] if you have any questions or concerns. The best time to call is Monday-Friday from 9am-4pm, but there is someone available 24/7. If after hours or the weekend, call the main hospital number and ask for the Internal Medicine Resident On-Call. If you need medication refills, please notify your pharmacy one  week in advance and they will send [place] a request.   Thank you for trusting me with your care. Wishing you the best!   Christiana Fuchs, [place]

## 2021-12-26 ENCOUNTER — Encounter: Payer: Self-pay | Admitting: Internal Medicine

## 2021-12-26 ENCOUNTER — Telehealth: Payer: Self-pay | Admitting: *Deleted

## 2021-12-26 ENCOUNTER — Other Ambulatory Visit (HOSPITAL_COMMUNITY): Payer: Self-pay

## 2021-12-26 DIAGNOSIS — N179 Acute kidney failure, unspecified: Secondary | ICD-10-CM | POA: Insufficient documentation

## 2021-12-26 DIAGNOSIS — E876 Hypokalemia: Secondary | ICD-10-CM

## 2021-12-26 DIAGNOSIS — Z0000 Encounter for general adult medical examination without abnormal findings: Secondary | ICD-10-CM | POA: Insufficient documentation

## 2021-12-26 DIAGNOSIS — R569 Unspecified convulsions: Secondary | ICD-10-CM | POA: Insufficient documentation

## 2021-12-26 HISTORY — DX: Hypokalemia: E87.6

## 2021-12-26 LAB — BMP8+ANION GAP
Anion Gap: 18 mmol/L (ref 10.0–18.0)
BUN/Creatinine Ratio: 13 (ref 9–20)
BUN: 17 mg/dL (ref 6–24)
CO2: 21 mmol/L (ref 20–29)
Calcium: 9.7 mg/dL (ref 8.7–10.2)
Chloride: 103 mmol/L (ref 96–106)
Creatinine, Ser: 1.33 mg/dL — ABNORMAL HIGH (ref 0.76–1.27)
Glucose: 109 mg/dL — ABNORMAL HIGH (ref 70–99)
Potassium: 4.2 mmol/L (ref 3.5–5.2)
Sodium: 142 mmol/L (ref 134–144)
eGFR: 63 mL/min/{1.73_m2} (ref >59)

## 2021-12-26 NOTE — Assessment & Plan Note (Signed)
Patient with history of seizures.  He has been taking Keppra 750 mg twice daily for several years.  He has never been able to establish care with neurology due to lack of insurance.  He has been out of Keppra for the last 10 days and denies seizure episode. A/P: I called and talked with [facility].  Keppra is on their IM program $4 list.  She was giving funds for 1 month supply of Keppra.

## 2021-12-26 NOTE — Assessment & Plan Note (Addendum)
Patient following up with history of 2 drug-eluting stents placed to RCA in August 2023.  He follows with Dr. Virgina Jock for cardiology.  He needs to remain on aspirin and ticagrelor for 1 year.  He continues to have difficulties with obtaining medications due to cost.  He has been getting Ticagrelor samples through cardiology office.  He denies using cocaine.  Over the last few weeks, he is having some chest pain when he is doing yard work.  He denies chest pain with walking and currently.  Denies shortness of breath. Assessment: Patient presenting with recent PCI 2 months ago who is now having signs of angina with yard work.  He denies current chest pain. Plan: Patient was given funds to go to [facility] outpatient program to get month supply of aspirin.  Currently has samples of ticagrelor I sent a message to Dr. Virgina Jock in regards to chest pain and ticagrelor samples.  If he is not able to continue to receive samples an alternative would be to switch patient to clopidogrel which is on the IM program and would be $4 a month. -Referral placed to community social work to help with applying for Medicaid. -Follow-up in 4 weeks

## 2021-12-26 NOTE — Assessment & Plan Note (Signed)
He is not currently able to afford nicotine gum.  We will plan to focus on resources for tobacco cessation at follow-up visit in 4 weeks.

## 2021-12-26 NOTE — Addendum Note (Signed)
Addended by: Edwyna Perfect on: 12/26/2021 10:35 AM   Modules accepted: Orders

## 2021-12-26 NOTE — Assessment & Plan Note (Signed)
Addendum: BMP showed potassium within normal limits at 4.2.  However creatinine is elevated at 1.3.  Baseline creatinine appears to be around 1.  Called and spoke with patient.  He has been urinating normally.  He denies orthopnea, PND, and has not noted swelling in his ankles.  His weight is stable from hospital discharge.  I think that creatinine elevation is likely related to losartan, spironolactone or Lasix initiation.  States that he only has 4 more pills left of each medication.  Ask him to hold losartan and spironolactone and to follow-up for a lab only visit to repeat BMP next week.  -Front desk contacted to schedule patient for lab only visit -BMP order placed for future visit

## 2021-12-26 NOTE — Assessment & Plan Note (Signed)
He follows with history of NSTEMI in 2023.  He follows with cardiology. Lab Results  Component Value Date   CHOL 193 10/28/2021   HDL 55 10/28/2021   LDLCALC 119 (H) 10/28/2021   TRIG 95 10/28/2021   CHOLHDL 3.5 10/28/2021   Medications were switched from rosuvastatin 40 mg to rosuvastatin 20 mg with addition of Repatha.  Patient has not been able to pick up Repatha and has been adherent with taking rosuvastatin.  He does not currently have insurance coverage and I do not think that Repatha is an attainable medication for him at this time. Plan: Repeat lipid panel to following November 9, 6 weeks after initiating statin therapy.  Has been adherent with rosuvastatin, but has not been able to obtain Repatha.  If LDL remains greater than goal for secondary prevention less than 70 would consider increasing rosuvastatin back up to 40 mg.

## 2021-12-26 NOTE — Assessment & Plan Note (Addendum)
BMP at hospital discharge of 3.2.  Patient has been on multiple medications that could affect his potassium including losartan, renal lactone, and Lasix. A/P: Recheck BMP  Addendum K wnl at 4.2

## 2021-12-26 NOTE — Chronic Care Management (AMB) (Signed)
  Care Coordination   Note   12/26/2021 Name: Dillon Wang MRN: [id number] DOB: Nov 07, 1965  Dillon Wang is a 56 y.o. year old male who sees Delene Ruffini, MD for primary care. I reached out to [name] by phone today to offer care coordination services.  Referral received   Dillon Wang was given information about Care Coordination services today including:   The Care Coordination services include support from the care team which includes your Nurse Coordinator, Clinical Social Worker, or Pharmacist.  The Care Coordination team is here to help remove barriers to the health concerns and goals most important to you. Care Coordination services are voluntary, and the patient may decline or stop services at any time by request to their care team member.   Care Coordination Consent Status: Patient agreed to services and verbal consent obtained.   Follow up plan:  Telephone appointment with care coordination team member scheduled for:  12/30/2021  Encounter Outcome:  Pt. Scheduled from referral   Julian Hy, [place] Direct Dial: [phone]

## 2021-12-26 NOTE — Assessment & Plan Note (Signed)
Hemoglobin A1c was completed in August at 6.6.  He has not been able to afford metformin.  He is not having any side effects from this medication. A/P: I talked with patient about lifestyle modifications that he can make including dietary changes and increasing exercise.  He did note that with yard work he is having some chest pain.  -Repeat hemoglobin A1c at follow-up in 4 weeks

## 2021-12-26 NOTE — Assessment & Plan Note (Signed)
Flu shot received today.

## 2021-12-30 ENCOUNTER — Ambulatory Visit: Payer: Self-pay | Admitting: Licensed Clinical Social Worker

## 2021-12-30 NOTE — Progress Notes (Signed)
Internal Medicine Clinic Attending  Case discussed with Dr. Masters  at the time of the visit.  We reviewed the resident's history and exam and pertinent patient test results.  I agree with the assessment, diagnosis, and plan of care documented in the resident's note.  

## 2021-12-30 NOTE — Addendum Note (Signed)
Addended by: Gilles Chiquito B on: 12/30/2021 09:58 AM   Modules accepted: Level of Service

## 2021-12-31 ENCOUNTER — Other Ambulatory Visit (HOSPITAL_COMMUNITY): Payer: Self-pay

## 2021-12-31 ENCOUNTER — Telehealth: Payer: Self-pay

## 2021-12-31 NOTE — Telephone Encounter (Signed)
MAILED PATIENT ASSISTANCE APPLICATION FOR BRILINTA ([employer] PATIENT ASSISTANCE) TO PT HOME.

## 2022-01-01 NOTE — Patient Outreach (Signed)
  Care Coordination   Initial Visit Note   12/30/2021 Name: Ziair Penson MRN: [id number] DOB: 08/07/1965  Coulter Oldaker is a 56 y.o. year old male who sees Delene Ruffini, MD for primary care. I spoke with  [name] by phone today.  What matters to the patients health and wellness today?  Prescription cost    Goals Addressed               This Visit's Progress     Care Coordination Activities- Medication Cost/ Medicaid (pt-stated)        Patient needing assistance with medication cost. Per PCP, Patient was given funds to go to [facility] outpatient program to get month supply of aspirin.  Currently has samples of ticagrelor.... SW sent inbasket to [facility] Pharmacist Tech to assist.   SW screened patient for Medicaid eligibility. Patient is pending disability and may be eligible. SW advised patient to apply for medicaid at [place] and advise agency he is pending disability. Patient advised he understood.  SDOH assessment completed. No additional barrier present.           SDOH assessments and interventions completed:  Yes  SDOH Interventions Today    Flowsheet Row Most Recent Value  SDOH Interventions   Financial Strain Interventions Other (Comment)  [Referred patient to Pharmacist]        Care Coordination Interventions Activated:  Yes  Care Coordination Interventions:  Pt. Visit CompletedYes, provided   Follow up plan: No further intervention required.   Encounter Outcome:

## 2022-01-01 NOTE — Patient Instructions (Signed)
Visit Information  Thank you for taking time to visit with me today. Please don't hesitate to contact me if I can be of assistance to you.   Following are the goals we discussed today:   Goals Addressed               This Visit's Progress     Care Coordination Activities- Medication Cost/ Medicaid (pt-stated)        Patient needing assistance with medication cost. Per PCP, Patient was given funds to go to [facility] outpatient program to get month supply of aspirin.  Currently has samples of ticagrelor.... SW sent inbasket to [facility] Pharmacist Tech to assist.   SW screened patient for Medicaid eligibility. Patient is pending disability and may be eligible. SW advised patient to apply for medicaid at [place] and advise agency he is pending disability. Patient advised he understood.  SDOH assessment completed. No additional barrier present.             Patient verbalizes understanding of instructions and care plan provided today and agrees to view in [place]. Active MyChart status and patient understanding of how to access instructions and care plan via MyChart confirmed with patient.     The care management team will reach out to the patient again over the next 30 days.   Milus Height, Arita Miss , MSW, [place] Social Worker [facility] Care Management  [phone]

## 2022-01-02 ENCOUNTER — Other Ambulatory Visit: Payer: Self-pay | Admitting: Internal Medicine

## 2022-01-02 ENCOUNTER — Other Ambulatory Visit (HOSPITAL_COMMUNITY): Payer: Self-pay

## 2022-01-02 ENCOUNTER — Other Ambulatory Visit: Payer: Medicaid Other

## 2022-01-02 ENCOUNTER — Encounter: Payer: Self-pay | Admitting: *Deleted

## 2022-01-02 ENCOUNTER — Other Ambulatory Visit: Payer: Self-pay | Admitting: Cardiology

## 2022-01-02 DIAGNOSIS — I252 Old myocardial infarction: Secondary | ICD-10-CM

## 2022-01-02 DIAGNOSIS — E7841 Elevated Lipoprotein(a): Secondary | ICD-10-CM

## 2022-01-02 DIAGNOSIS — E782 Mixed hyperlipidemia: Secondary | ICD-10-CM

## 2022-01-02 DIAGNOSIS — I2510 Atherosclerotic heart disease of native coronary artery without angina pectoris: Secondary | ICD-10-CM

## 2022-01-02 DIAGNOSIS — N179 Acute kidney failure, unspecified: Secondary | ICD-10-CM

## 2022-01-02 MED ORDER — CLOPIDOGREL BISULFATE 75 MG PO TABS
75.0000 mg | ORAL_TABLET | Freq: Every day | ORAL | 0 refills | Status: DC
  Filled 2022-01-02: qty 30, 30d supply, fill #0

## 2022-01-02 NOTE — Progress Notes (Signed)
01-02-2022    I called and spoke with Dillon Wang at [facility] [place] office.  Explained that Dillon Wang had a lab visit at our office and Dillon Wang labs orders for Dillon Wang (Lipid and LPA) were released in error.  We have canceled the orders released in error. Explained new orders would need to be created for the expected date of 01-31-22 at [place], per original orders. I apologized for the inconvenience.  Dillon Wang. [place] [phone]

## 2022-01-02 NOTE — Addendum Note (Signed)
Addended by: Truddie Crumble on: 01/02/2022 02:06 PM   Modules accepted: Orders

## 2022-01-02 NOTE — Telephone Encounter (Signed)
Patient notified at today's lab only visit that PAP paperwork was mailed to him on 10/31. Requested he complete and return to [facility] as soon as possible. In the interim, Dr. Howie Ill will send Rx for Plavix to [facility] as patient took last dose of Brilinta yesterday.

## 2022-01-02 NOTE — Progress Notes (Signed)
Patient came to clinic as he has run out of brilinta, last dose 11/1. I have not received message from cardiology about samples. Rosendo Gros, [facility] pharmacy tech, mailed Brilinta patient assistance application 01/10.  Plan: I have sent in 30 day supply of plavix to [facility]. He was encouraged to complete application for brilinta. -plavix 75 mg for 30 tabs sent to [facility]  -discontinued brilinta -follow-up in 3 weeks Addendum: IM program added to comments.

## 2022-01-03 ENCOUNTER — Other Ambulatory Visit (HOSPITAL_COMMUNITY): Payer: Self-pay

## 2022-01-03 LAB — BMP8+ANION GAP
Anion Gap: 14 mmol/L (ref 10.0–18.0)
BUN/Creatinine Ratio: 13 (ref 9–20)
BUN: 14 mg/dL (ref 6–24)
CO2: 20 mmol/L (ref 20–29)
Calcium: 9.5 mg/dL (ref 8.7–10.2)
Chloride: 103 mmol/L (ref 96–106)
Creatinine, Ser: 1.1 mg/dL (ref 0.76–1.27)
Glucose: 132 mg/dL — ABNORMAL HIGH (ref 70–99)
Potassium: 4.3 mmol/L (ref 3.5–5.2)
Sodium: 137 mmol/L (ref 134–144)
eGFR: 79 mL/min/{1.73_m2} (ref >59)

## 2022-01-03 MED ORDER — CLOPIDOGREL BISULFATE 75 MG PO TABS
75.0000 mg | ORAL_TABLET | Freq: Every day | ORAL | 0 refills | Status: DC
  Filled 2022-01-03: qty 30, 30d supply, fill #0

## 2022-01-27 ENCOUNTER — Other Ambulatory Visit (HOSPITAL_COMMUNITY): Payer: Self-pay

## 2022-01-27 ENCOUNTER — Encounter: Payer: Self-pay | Admitting: Student

## 2022-01-27 ENCOUNTER — Ambulatory Visit (INDEPENDENT_AMBULATORY_CARE_PROVIDER_SITE_OTHER): Payer: Self-pay | Admitting: Student

## 2022-01-27 VITALS — BP 183/105 | HR 72 | Temp 98.2°F | Ht 69.0 in | Wt 273.0 lb

## 2022-01-27 DIAGNOSIS — E782 Mixed hyperlipidemia: Secondary | ICD-10-CM

## 2022-01-27 DIAGNOSIS — E119 Type 2 diabetes mellitus without complications: Secondary | ICD-10-CM

## 2022-01-27 DIAGNOSIS — I252 Old myocardial infarction: Secondary | ICD-10-CM

## 2022-01-27 DIAGNOSIS — I10 Essential (primary) hypertension: Secondary | ICD-10-CM

## 2022-01-27 DIAGNOSIS — E1165 Type 2 diabetes mellitus with hyperglycemia: Secondary | ICD-10-CM

## 2022-01-27 DIAGNOSIS — F17200 Nicotine dependence, unspecified, uncomplicated: Secondary | ICD-10-CM

## 2022-01-27 DIAGNOSIS — G4733 Obstructive sleep apnea (adult) (pediatric): Secondary | ICD-10-CM

## 2022-01-27 DIAGNOSIS — N179 Acute kidney failure, unspecified: Secondary | ICD-10-CM

## 2022-01-27 DIAGNOSIS — F17210 Nicotine dependence, cigarettes, uncomplicated: Secondary | ICD-10-CM

## 2022-01-27 DIAGNOSIS — Z7984 Long term (current) use of oral hypoglycemic drugs: Secondary | ICD-10-CM

## 2022-01-27 LAB — POCT GLYCOSYLATED HEMOGLOBIN (HGB A1C): Hemoglobin A1C: 6.8 % — AB (ref 4.0–5.6)

## 2022-01-27 LAB — GLUCOSE, CAPILLARY: Glucose-Capillary: 158 mg/dL — ABNORMAL HIGH (ref 70–99)

## 2022-01-27 MED ORDER — METFORMIN HCL ER 500 MG PO TB24
1000.0000 mg | ORAL_TABLET | Freq: Every day | ORAL | 3 refills | Status: DC
  Filled 2022-01-27: qty 60, 30d supply, fill #0

## 2022-01-27 MED ORDER — CLOPIDOGREL BISULFATE 75 MG PO TABS
75.0000 mg | ORAL_TABLET | Freq: Every day | ORAL | 0 refills | Status: DC
  Filled 2022-01-27: qty 30, 30d supply, fill #0

## 2022-01-27 NOTE — Assessment & Plan Note (Signed)
BP elevated to 178/103 and 183/105. Takes losartan 100 mg daily, spironolactone 25 mg daily but was stopped after last OV due to elevated Cr. Repeat BMP with normalization.   -Restart losartan 100 mg daily -BMP at next visit

## 2022-01-27 NOTE — Assessment & Plan Note (Signed)
Resolved with holding spironolactone and losartan. Will restart losartan for elevated BP

## 2022-01-27 NOTE — Assessment & Plan Note (Addendum)
Not currently on medications. A1c today 6.8% glucose meter with CBG between 110-160. Foot exam completed today. Discussed continuing with diet and lifestyle modifications. Previously on metformin but ran out. Could restart this if he is able to afford it, but will have him prioritize picking up plavix and losartan. He is agreeable to this. Will also need referral to ophthalmology and ordered future microalbumin/creatine that he can get once insurance starts on 01/31/2022

## 2022-01-27 NOTE — Patient Instructions (Addendum)
It was a pleasure seeing you in clinic today.  Your blood pressure is elevated  Please restart losartan 100 mg daily  Please pick up your losartan and plavix as soon as you can   Diabetes Your A1c is well controlled at 6.8% ( goal is less than 7) continue with diet and lifestyle changes If you are able to afford if please pick up your metformin and take 500 mg daily for 1 week, then increase to 1000 mg daily   We will check your cholestrol levels today  Please follow up with cardiology on 12/8  Follow up in 2 weeks for blood pressure recheck

## 2022-01-27 NOTE — Assessment & Plan Note (Signed)
Will check lipid panel, need to come for lab only visit as insurance coverage begins on 12/1. May need to increase rosuvastatin from 20 mg to 40 mg daily if still elevated.

## 2022-01-27 NOTE — Progress Notes (Signed)
Established Patient Office Visit  Subjective   Patient ID: Dillon Wang, male    DOB: [birth year]  Age: 56 y.o. MRN: [id number]  No chief complaint on file.   Dillon Wang is a 56 y.o. person living with a history listed below who presents to clinic for follow up of HTN, DM, and CAD. Please refer to problem based charting for further details and assessment and plan of current problem and chronic medical conditions.     Patient Active Problem List   Diagnosis Date Noted   Seizures ([place]) 12/26/2021   Hypokalemia 12/26/2021   Healthcare maintenance 12/26/2021   AKI (acute kidney injury) ([place]) 12/26/2021   History of non-ST elevation myocardial infarction (NSTEMI) 11/06/2021   Mixed hyperlipidemia 11/06/2021   Type 2 diabetes mellitus with hyperglycemia, without long-term current use of insulin ([place]) 09/20/2018   Obstructive sleep apnea 09/20/2017   Morbid obesity ([place]) 09/20/2017   Hypertension    Cocaine abuse ([place])    Tobacco use disorder    Alcohol abuse       ROS: negative as per HPI    Objective:     BP (!) 183/105 (BP Location: Right Arm, Patient Position: Sitting, Cuff Size: Normal)   Pulse 72   Temp 98.2 F (36.8 C) (Oral)   Ht _0  (1.753 m)   Wt 273 lb (123.8 kg)   SpO2 100%   BMI 40.32 kg/m  BP Readings from Last 3 Encounters:  01/27/22 (!) 183/105  12/25/21 127/75  11/27/21 118/67      Physical Exam Constitutional:      Appearance: He is obese.  HENT:     Mouth/Throat:     Mouth: Mucous membranes are moist.     Pharynx: Oropharynx is clear.  Eyes:     Extraocular Movements: Extraocular movements intact.     Pupils: Pupils are equal, round, and reactive to light.  Cardiovascular:     Rate and Rhythm: Normal rate and regular rhythm.     Pulses: Normal pulses.     Heart sounds: No murmur heard. Pulmonary:     Effort: Pulmonary effort is normal.     Breath sounds: No rhonchi or rales.  Abdominal:     General: Abdomen is flat. Bowel sounds  are normal. There is no distension.     Palpations: Abdomen is soft.     Tenderness: There is no abdominal tenderness.  Musculoskeletal:        General: Normal range of motion.     Right lower leg: No edema.     Left lower leg: No edema.  Skin:    General: Skin is warm and dry.     Capillary Refill: Capillary refill takes less than 2 seconds.  Neurological:     General: No focal deficit present.     Mental Status: He is alert and oriented to person, place, and time.  Psychiatric:        Mood and Affect: Mood normal.        Behavior: Behavior normal.     Results for orders placed or performed in visit on 01/27/22  Glucose, capillary  Result Value Ref Range   Glucose-Capillary 158 (H) 70 - 99 mg/dL  POC Hbg A1C  Result Value Ref Range   Hemoglobin A1C 6.8 (A) 4.0 - 5.6 %   HbA1c POC (<> result, manual entry)     HbA1c, POC (prediabetic range)     HbA1c, POC (controlled diabetic range)      Last  metabolic panel Lab Results  Component Value Date   GLUCOSE 132 (H) 01/02/2022   NA 137 01/02/2022   K 4.3 01/02/2022   CL 103 01/02/2022   CO2 20 01/02/2022   BUN 14 01/02/2022   CREATININE 1.10 01/02/2022   EGFR 79 01/02/2022   CALCIUM 9.5 01/02/2022   PHOS 2.9 02/21/2018   PROT 6.9 10/27/2021   ALBUMIN 3.6 10/27/2021   LABGLOB 2.6 01/12/2018   AGRATIO 1.9 01/12/2018   BILITOT 0.5 10/27/2021   ALKPHOS 67 10/27/2021   AST 23 10/27/2021   ALT 22 10/27/2021   ANIONGAP 11 10/29/2021   Last hemoglobin A1c Lab Results  Component Value Date   HGBA1C 6.8 (A) 01/27/2022      The ASCVD Risk score (Arnett DK, et al., 2019) failed to calculate for the following reasons:   The patient has a prior MI or stroke diagnosis    Assessment & Plan:   Problem List Items Addressed This Visit       Cardiovascular and Mediastinum   Hypertension    BP elevated to 178/103 and 183/105. Takes losartan 100 mg daily, spironolactone 25 mg daily but was stopped after last OV due to  elevated Cr. Repeat BMP with normalization.   -Restart losartan 100 mg daily -BMP at next visit        Respiratory   Obstructive sleep apnea    Uses CPAP machine nightly        Endocrine   Type 2 diabetes mellitus with hyperglycemia, without long-term current use of insulin ([place]) - Primary    Not currently on medications. A1c today 6.8% glucose meter with CBG between 110-160. Foot exam completed today. Discussed continuing with diet and lifestyle modifications. Previously on metformin but ran out. Could restart this if he is able to afford it, but will have him prioritize picking up plavix and losartan. He is agreeable to this. Will also need referral to ophthalmology and ordered future microalbumin/creatine that he can get once insurance starts on 01/31/2022      Relevant Medications   metFORMIN (GLUCOPHAGE-XR) 500 MG 24 hr tablet   Other Relevant Orders   POC Hbg A1C (Completed)   Microalbumin / Creatinine Urine Ratio     Genitourinary   AKI (acute kidney injury) ([place])    Resolved with holding spironolactone and losartan. Will restart losartan for elevated BP        Other   Tobacco use disorder    Previously smoking 1ppd now cut down to 1/2 ppd. Encouraged him to continue cutting down. Will continue discussing with him at next visit. Will hold off on medical therapy until he has insurance.      History of non-ST elevation myocardial infarction (NSTEMI)    Had one episode of  chest tightness 2 weeks ago while raking leaves improved with SL nitro. No further CP since then.   Now taking plavix as he cannot afford brilinta. Working on getting PA for brilinta through cardiology office. Has been out of plavix for 2 days. Also out of rosuvastatin. He can pick both up tomorrow once sister is paid.   -Restart clopidogrel 75 mg daily and rosuvastatin 20 mg -Check lipid panel and lipoprotein a  - Has follow up with cardiology on 12/8, he will discuss brilinta with cardiologist         Relevant Medications   clopidogrel (PLAVIX) 75 MG tablet   Mixed hyperlipidemia    Will check lipid panel, need to come for lab only visit  as insurance coverage begins on 12/1. May need to increase rosuvastatin from 20 mg to 40 mg daily if still elevated.       Relevant Orders   Lipid Profile   Lipoprotein A (LPA)   Other Visit Diagnoses     Type 2 diabetes mellitus without complication, without long-term current use of insulin ([facility])       Relevant Medications   metFORMIN (GLUCOPHAGE-XR) 500 MG 24 hr tablet       Return in about 2 weeks (around 02/10/2022).    Iona Beard, MD

## 2022-01-27 NOTE — Assessment & Plan Note (Signed)
Previously smoking 1ppd now cut down to 1/2 ppd. Encouraged him to continue cutting down. Will continue discussing with him at next visit. Will hold off on medical therapy until he has insurance.

## 2022-01-27 NOTE — Assessment & Plan Note (Signed)
Had one episode of  chest tightness 2 weeks ago while raking leaves improved with SL nitro. No further CP since then.   Now taking plavix as he cannot afford brilinta. Working on getting PA for brilinta through cardiology office. Has been out of plavix for 2 days. Also out of rosuvastatin. He can pick both up tomorrow once sister is paid.   -Restart clopidogrel 75 mg daily and rosuvastatin 20 mg -Check lipid panel and lipoprotein a  - Has follow up with cardiology on 12/8, he will discuss brilinta with cardiologist

## 2022-01-27 NOTE — Assessment & Plan Note (Addendum)
Uses CPAP machine nightly

## 2022-01-29 ENCOUNTER — Other Ambulatory Visit (HOSPITAL_COMMUNITY): Payer: Self-pay

## 2022-01-30 ENCOUNTER — Ambulatory Visit: Payer: Self-pay | Admitting: Licensed Clinical Social Worker

## 2022-01-30 NOTE — Patient Instructions (Signed)
Visit Information  Thank you for taking time to visit with me today. Please don't hesitate to contact me if I can be of assistance to you.   Following are the goals we discussed today:   Goals Addressed               This Visit's Progress     Care Coordination Activities- Medication Cost/ Medicaid (pt-stated)        Patient stated he was able to get his prescription and approved for medical insurance. Patient reported no further assistance needed.            Patient verbalizes understanding of instructions and care plan provided today and agrees to view in [place]. Active MyChart status and patient understanding of how to access instructions and care plan via MyChart confirmed with patient.     No further follow up required: .  Milus Height, Arita Miss, MSW, [place]  Social Worker [facility] Care Management  [phone]

## 2022-01-30 NOTE — Patient Outreach (Signed)
  Care Coordination   Follow Up Visit Note   01/30/2022 Name: Dillon Wang MRN: [id number] DOB: [birth year]  Dillon Wang is a 56 y.o. year old male who sees Dillon Ruffini, MD for primary care. I spoke with  Dillon Wang by phone today.  What matters to the patients health and wellness today?  Prescription and medical coverage    Goals Addressed               This Visit's Progress     Care Coordination Activities- Medication Cost/ Medicaid (pt-stated)        Patient stated he was able to get his prescription and approved for medical insurance. Patient reported no further assistance needed.          SDOH assessments and interventions completed:  Yes     Care Coordination Interventions:  Yes, provided   Follow up plan: No further intervention required.   Encounter Outcome:  Pt. Visit Completed

## 2022-02-03 NOTE — Progress Notes (Signed)
Internal Medicine Clinic Attending  Case discussed with Dr. Lisabeth Devoid  at the time of the visit.  We reviewed the resident's history and exam and pertinent patient test results.  I agree with the assessment, diagnosis, and plan of care documented in the resident's note.

## 2022-02-07 ENCOUNTER — Encounter: Payer: Self-pay | Admitting: Cardiology

## 2022-02-07 ENCOUNTER — Ambulatory Visit: Payer: Medicaid Other | Admitting: Cardiology

## 2022-02-07 VITALS — BP 135/91 | HR 95 | Ht 69.0 in | Wt 273.8 lb

## 2022-02-07 DIAGNOSIS — R0609 Other forms of dyspnea: Secondary | ICD-10-CM | POA: Insufficient documentation

## 2022-02-07 DIAGNOSIS — R0600 Dyspnea, unspecified: Secondary | ICD-10-CM | POA: Insufficient documentation

## 2022-02-07 DIAGNOSIS — I25118 Atherosclerotic heart disease of native coronary artery with other forms of angina pectoris: Secondary | ICD-10-CM

## 2022-02-07 MED ORDER — METOPROLOL SUCCINATE ER 25 MG PO TB24: 25.0000 mg | ORAL_TABLET | Freq: Every day | ORAL | 3 refills | Status: DC

## 2022-02-07 NOTE — Progress Notes (Signed)
Follow up visit  Subjective:   Dillon Wang, male    DOB: 1965-05-16, 56 y.o.   MRN: [id number]   HPI  Chief Complaint  Patient presents with   Follow-up   Coronary Artery Disease   Hypertension   Hyperlipidemia    56 y.o. African American male  with hypertension, hyperlipidemia, type 2 DM, obesity, OSA, CAD, h/o polysubstance abuse, h/o prostate cancer, h/o seizures  Patient has recently had episodes of left sided chest pain that occurs only with lifting certain objects. Patient states that this pain is different from his episodes of NSTEMI in 10/2021. He does endorse exertional dyspnea symptoms.    Current Outpatient Medications:    albuterol (VENTOLIN HFA) 108 (90 Base) MCG/ACT inhaler, Inhale 2 puffs into the lungs every 4 (four) hours as needed for wheezing or shortness of breath., Disp: 18 g, Rfl: 2   amLODipine (NORVASC) 10 MG tablet, Take 1 tablet (10 mg total) by mouth daily., Disp: 30 tablet, Rfl: 5   aspirin 81 MG chewable tablet, Chew 1 tablet (81 mg total) by mouth daily., Disp: 90 tablet, Rfl: 3   budesonide-formoterol (SYMBICORT) 160-4.5 MCG/ACT inhaler, Inhale 2 puffs into the lungs 2 (two) times daily., Disp: 10.2 g, Rfl: 3   clopidogrel (PLAVIX) 75 MG tablet, Take 1 tablet (75 mg total) by mouth daily., Disp: 30 tablet, Rfl: 0   furosemide (LASIX) 40 MG tablet, Take 1 tablet (40 mg total) by mouth daily., Disp: 30 tablet, Rfl: 11   levETIRAcetam (KEPPRA) 750 MG tablet, Take 1 tablet (750 mg total) by mouth 2 (two) times daily., Disp: 60 tablet, Rfl: 5   losartan (COZAAR) 100 MG tablet, Take 1 tablet (100 mg total) by mouth daily., Disp: 30 tablet, Rfl: 5   metFORMIN (GLUCOPHAGE-XR) 500 MG 24 hr tablet, Take 2 tablets (1,000 mg total) by mouth daily with breakfast., Disp: 60 tablet, Rfl: 3   metoprolol succinate (TOPROL XL) 25 MG 24 hr tablet, Take 1 tablet (25 mg total) by mouth daily., Disp: 90 tablet, Rfl: 3   Misc. Devices MISC, Auto bipap with Medium size  [employer] Full Face Mask Simplus mask/ heated humidification. Autopap settings 15-20 cm H2O., Disp: 1 each, Rfl: 0   nitroGLYCERIN (NITROSTAT) 0.4 MG SL tablet, Place 1 tablet (0.4 mg total) under the tongue every 5 (five) minutes as needed for chest pain., Disp: 30 tablet, Rfl: 12   rosuvastatin (CRESTOR) 20 MG tablet, Take 1 tablet (20 mg total) by mouth daily., Disp: 90 tablet, Rfl: 2   Evolocumab (REPATHA SURECLICK) 858 MG/ML SOAJ, Inject 140 mg into the skin every 14 (fourteen) days. (Patient not taking: Reported on 02/07/2022), Disp: 6 mL, Rfl: 5   nicotine polacrilex (NICORETTE) 2 MG gum, Take 1 each (2 mg total) by mouth as needed for smoking cessation. (Patient not taking: Reported on 02/07/2022), Disp: 60 tablet, Rfl: 3   Cardiovascular & other pertient studies:  Reviewed external labs and tests, independently interpreted  EKG 11/06/2021: Sinus rhythm 93 bpm  Old inferior infarct   EKG 10/29/2021: Sinus rhythm Inferior infarct, age indeterminate   Echocardiogram 10/28/2021:  1. Left ventricular ejection fraction, by estimation, is 55 to 60%. The  left ventricle has normal function. The left ventricle has no regional  wall motion abnormalities. Left ventricular diastolic parameters are  indeterminate.   2. Right ventricular systolic function is normal. The right ventricular  size is normal.   3. Left atrial size was mildly dilated.   4. The mitral valve  is normal in structure. Mild mitral valve  regurgitation. No evidence of mitral stenosis.   5. The aortic valve is normal in structure. Aortic valve regurgitation is  not visualized. No aortic stenosis is present.    Coronary intervention 10/29/2021: LM: Normal LAD: Ostial to prox diffuse 40% disease Lcx: Large vessel        Mid OM1 (Non-culprit) tandem 60%-80% stenoses RCA: Prox 95% stenosis (Culprit)         Disal focal 60% disease   Successful percutaneous coronary intervention prox RCA and mid OM1        PTCA and  stent placement 3.5 X 28 mm Synergy drug-eluting stent-->post dilatation with 4.77m [place] balloon up to 18 atm        PTCA and stent placement 4.0 X 20 mm Synergy drug-eluting stent-->post dilatation with 4.029mNC balloon up to 18 atm   0% residual stenoses in prox RCA and mid OM1 Distal embolization in small caliber distal OM1   Recommend IV nitroglycerin 10 mics an hour overnight, and IV Aggrastat for 6 hours. Recommend DAPT with aspirin and Brilinta for 1 year  Recent labs: 01/02/2022: Glucose 132, BUN/Cr 14/1.1. EGFR 79. Na/K 137/4.3.  HbA1C 6/8%  10/29/2021: Glucose 145, BUN/Cr 10/1.19. EGFR >60. Na/K 133/3.2.  H/H 17/51. MCV 90. Platelets 189 HbA1C 6.6% Chol 193, TG 95, HDL 55, LDL 119 Lipoprotein (a) 220 Trop HS peak 4200    Review of Systems  Cardiovascular:  Positive for chest pain and dyspnea on exertion. Negative for leg swelling, palpitations and syncope.         Vitals:   02/07/22 1416 02/07/22 1420  BP: (!) 141/91 (!) 135/91  Pulse: 97 95  SpO2: 94%     Body mass index is 40.43 kg/m. Filed Weights   02/07/22 1416  Weight: 273 lb 12.8 oz (124.2 kg)     Objective:   Physical Exam Vitals and nursing note reviewed.  Constitutional:      General: He is not in acute distress. Neck:     Vascular: No JVD.  Cardiovascular:     Rate and Rhythm: Normal rate and regular rhythm.     Heart sounds: Normal heart sounds. No murmur heard. Pulmonary:     Effort: Pulmonary effort is normal.     Breath sounds: Normal breath sounds. No wheezing or rales.  Musculoskeletal:     Right lower leg: No edema.     Left lower leg: No edema.             Visit diagnoses:   ICD-10-CM   1. Coronary artery disease of native artery of native heart with stable angina pectoris ([place] I25.118 PCV MYOCARDIAL PERFUSION WO LEXISCAN    2. Exertional dyspnea  R06.09        Orders Placed This Encounter  Procedures   PCV MYOCARDIAL PERFUSION WO LEXISCAN    Meds ordered  this encounter  Medications   metoprolol succinate (TOPROL XL) 25 MG 24 hr tablet    Sig: Take 1 tablet (25 mg total) by mouth daily.    Dispense:  90 tablet    Refill:  3     Assessment & Recommendations:   [age].o. African American male  with hypertension, hyperlipidemia, type 2 DM, obesity, OSA, CAD, h/o polysubstance abuse, h/o prostate cancer, h/o seizures  CAD: NSTEMI 10/2021 Successful PCI to proximal circumflex and mid OM1. Distal embolization of small caliber distal OM1, periprocedural infarct with peak HS tro 4200 Recommend DAPT with  aspirin and Plavix for 1 year (could not afford Brilinta). Cautious introduction of metoprolol tartrate 25 mg bid. Counseled regarding ongoing cocaine use. Continue losartan 100 mg daily, amlodipine 10 mg daily. LDL 119, lipoprotein (a) 220. Currently on Crestor to 20 mg daily. Check lipid panel. Re-emphazied importance of cocaine cessation.  Recent chest pain, although different from his angina in 10/2021, are concerning. Recommend exercise nuclear stress test.      Nigel Mormon, MD Pager: [phone] Office: [phone]

## 2022-02-10 ENCOUNTER — Telehealth: Payer: Self-pay | Admitting: *Deleted

## 2022-02-10 ENCOUNTER — Encounter: Payer: Medicaid Other | Admitting: Student

## 2022-02-10 NOTE — Telephone Encounter (Signed)
Attempted to contact pt regarding missed appt for today with Internal Medicine- No answer Unable to leave message-mailbox full No further action needed at this time  Phone call complete

## 2022-02-14 ENCOUNTER — Other Ambulatory Visit (INDEPENDENT_AMBULATORY_CARE_PROVIDER_SITE_OTHER): Payer: Commercial Managed Care - HMO

## 2022-02-14 DIAGNOSIS — E1165 Type 2 diabetes mellitus with hyperglycemia: Secondary | ICD-10-CM

## 2022-02-14 DIAGNOSIS — E782 Mixed hyperlipidemia: Secondary | ICD-10-CM | POA: Diagnosis not present

## 2022-02-16 LAB — MICROALBUMIN / CREATININE URINE RATIO
Creatinine, Urine: 11.1 mg/dL
Microalb/Creat Ratio: <27 mg/g creat (ref 0–29)
Microalbumin, Urine: <3 ug/mL

## 2022-02-16 LAB — LIPID PANEL
Chol/HDL Ratio: 3.1 ratio (ref 0.0–5.0)
Cholesterol, Total: 159 mg/dL (ref 100–199)
HDL: 51 mg/dL (ref >39)
LDL Chol Calc (NIH): 88 mg/dL (ref 0–99)
Triglycerides: 111 mg/dL (ref 0–149)
VLDL Cholesterol Cal: 20 mg/dL (ref 5–40)

## 2022-02-16 LAB — LIPOPROTEIN A (LPA): Lipoprotein (a): 195.3 nmol/L — ABNORMAL HIGH (ref <75.0)

## 2022-02-19 ENCOUNTER — Other Ambulatory Visit: Payer: Self-pay

## 2022-02-19 ENCOUNTER — Encounter: Payer: Self-pay | Admitting: Student

## 2022-02-19 ENCOUNTER — Ambulatory Visit: Payer: Commercial Managed Care - HMO | Admitting: Student

## 2022-02-19 VITALS — BP 134/81 | HR 95 | Temp 98.2°F | Ht 69.0 in | Wt 271.5 lb

## 2022-02-19 DIAGNOSIS — N179 Acute kidney failure, unspecified: Secondary | ICD-10-CM

## 2022-02-19 DIAGNOSIS — E782 Mixed hyperlipidemia: Secondary | ICD-10-CM

## 2022-02-19 DIAGNOSIS — E1165 Type 2 diabetes mellitus with hyperglycemia: Secondary | ICD-10-CM

## 2022-02-19 DIAGNOSIS — F17210 Nicotine dependence, cigarettes, uncomplicated: Secondary | ICD-10-CM

## 2022-02-19 DIAGNOSIS — I25118 Atherosclerotic heart disease of native coronary artery with other forms of angina pectoris: Secondary | ICD-10-CM

## 2022-02-19 DIAGNOSIS — Z7984 Long term (current) use of oral hypoglycemic drugs: Secondary | ICD-10-CM

## 2022-02-19 DIAGNOSIS — I10 Essential (primary) hypertension: Secondary | ICD-10-CM | POA: Diagnosis not present

## 2022-02-19 DIAGNOSIS — F17200 Nicotine dependence, unspecified, uncomplicated: Secondary | ICD-10-CM

## 2022-02-19 MED ORDER — METOPROLOL SUCCINATE ER 25 MG PO TB24: 25.0000 mg | ORAL_TABLET | Freq: Every day | ORAL | 9 refills | Status: DC

## 2022-02-19 MED ORDER — VARENICLINE TARTRATE (STARTER) 0.5 MG X 11 & 1 MG X 42 PO TBPK: ORAL_TABLET | ORAL | 5 refills | Status: DC

## 2022-02-19 MED ORDER — ROSUVASTATIN CALCIUM 40 MG PO TABS: 40.0000 mg | ORAL_TABLET | Freq: Every day | ORAL | 11 refills | Status: DC

## 2022-02-19 NOTE — Patient Instructions (Addendum)
For your rosuvastatin (crestor) please take two '20mg'$  tablets daily. After this bottle please fill the new prescription with '40mg'$  tablet daily.   Please start taking chantix to help stop smoking.

## 2022-02-20 LAB — BMP8+ANION GAP
Anion Gap: 18 mmol/L (ref 10.0–18.0)
BUN/Creatinine Ratio: 14 (ref 9–20)
BUN: 15 mg/dL (ref 6–24)
CO2: 23 mmol/L (ref 20–29)
Calcium: 9.2 mg/dL (ref 8.7–10.2)
Chloride: 100 mmol/L (ref 96–106)
Creatinine, Ser: 1.1 mg/dL (ref 0.76–1.27)
Glucose: 151 mg/dL — ABNORMAL HIGH (ref 70–99)
Potassium: 4.3 mmol/L (ref 3.5–5.2)
Sodium: 141 mmol/L (ref 134–144)
eGFR: 79 mL/min/{1.73_m2} (ref >59)

## 2022-02-20 NOTE — Assessment & Plan Note (Signed)
Patient continues to have stable angina. He had normal TTE 10/2021. He follows regularly with cardiology (12/8 was LCV). He will undergo nuclear stress test 12/27.   Continue metop succinate, increased crestor to '40mg'$  qd given LDL 88 Discussed smoking cessation, patient willing to trial chantix.

## 2022-02-20 NOTE — Progress Notes (Signed)
   CC: f/u of chronic medical problems   HPI:  Dillon Wang is a 56 y.o. M with PMH per below who presents for follow up of his chronic medical problems.   Patient reports that he continues to have chronic stable angina. He had an NSTEMI 10/28/2021 and is s/p PCI with DES to prox RCA and mid OM1. He states that he has a pulling and tightness in the L chest whenever he walks or is working. He has not symptoms at rest. He continues to smoke and no longer uses cocaine. He is adherent with all of his medications. He takes ASA and plavix because of the affordability of brillinta. His crestor was increased to '40mg'$  daily given his LDL was 88. Cardiology also recently started him on metop succinate '25mg'$  qd 12/8.   Regarding his HTN he is near goal with losartan. Which was resumed 11/27. Spironolactone was held due to AKI. He denies orthostasis.   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   Physical Exam:  Vitals:   02/19/22 0947  BP: 134/81  Pulse: 95  Temp: 98.2 F (36.8 C)  TempSrc: Oral  SpO2: 95%  Weight: 271 lb 8 oz (123.2 kg)  Height: '5\' 9"'$  (1.753 m)   Constitutional: Well-developed, well-nourished, and in no distress.  Cardiovascular: Normal rate, regular rhythm, intact distal pulses. No gallop and no friction rub.  No murmur heard. No lower extremity edema  Pulmonary: Non labored breathing on room air, no wheezing or rales  Abdominal:  Soft. Normal bowel sounds. Non distended and non tender Musculoskeletal: Normal range of motion.        General: No tenderness or edema.  Neurological: Alert and oriented to person, place, and time. Non focal  Skin: Skin is warm and dry.    Assessment & Plan:   See Encounters Tab for problem based charting.  Patient discussed with Dr. Evette Doffing

## 2022-02-20 NOTE — Progress Notes (Signed)
Internal Medicine Clinic Attending  Case discussed with Dr. Carter  At the time of the visit.  We reviewed the resident's history and exam and pertinent patient test results.  I agree with the assessment, diagnosis, and plan of care documented in the resident's note.  

## 2022-02-20 NOTE — Assessment & Plan Note (Signed)
Resolved. Continue his losartan but will continue to hold spironolactone

## 2022-02-20 NOTE — Progress Notes (Signed)
Called patient and got his voicemail to let him know that his kidney function is at baseline.

## 2022-02-20 NOTE — Assessment & Plan Note (Signed)
    02/19/2022    9:47 AM 02/07/2022    2:20 PM 02/07/2022    2:16 PM  Vitals with BMI  Height '5\' 9"'$   '5\' 9"'$   Weight 271 lbs 8 oz  273 lbs 13 oz  BMI 44.92  01.00  Systolic 712 197 588  Diastolic 81 91 91  Pulse 95 95 97  Blood pressure is near goal with amlodipine '10mg'$ , losartan '100mg'$  qd, lasix '40mg'$  qd, and metop succinate '25mg'$  qd. Will continue to hold his spironolactone given recent AKI.  Would consider transitioning patient to carvedilol if he requires further blood pressure control.

## 2022-02-26 ENCOUNTER — Other Ambulatory Visit (HOSPITAL_COMMUNITY): Payer: Self-pay

## 2022-02-26 ENCOUNTER — Other Ambulatory Visit: Payer: Medicaid Other

## 2022-02-26 ENCOUNTER — Telehealth: Payer: Self-pay

## 2022-02-26 NOTE — Telephone Encounter (Signed)
Submitted application for BRILINTA to [place] for patient assistance.   Phone: [phone]

## 2022-02-28 ENCOUNTER — Other Ambulatory Visit: Payer: Self-pay

## 2022-02-28 DIAGNOSIS — I252 Old myocardial infarction: Secondary | ICD-10-CM

## 2022-02-28 MED ORDER — CLOPIDOGREL BISULFATE 75 MG PO TABS: 75.0000 mg | ORAL_TABLET | Freq: Every day | ORAL | 11 refills | Status: DC

## 2022-02-28 NOTE — Telephone Encounter (Signed)
Requested Clopidogrel refill. Refill sent in per request.

## 2022-03-07 ENCOUNTER — Ambulatory Visit: Payer: Medicaid Other | Admitting: Cardiology

## 2022-03-24 ENCOUNTER — Ambulatory Visit: Payer: Medicaid Other

## 2022-03-24 DIAGNOSIS — I25118 Atherosclerotic heart disease of native coronary artery with other forms of angina pectoris: Secondary | ICD-10-CM

## 2022-03-26 NOTE — Progress Notes (Deleted)
Follow up visit  Subjective:   Dillon Wang, male    DOB: 03-Jan-1966, 57 y.o.   MRN: [id number]   HPI  No chief complaint on file.   57 y.o. African American male  with hypertension, hyperlipidemia, type 2 DM, obesity, OSA, CAD, h/o polysubstance abuse, h/o prostate cancer, h/o seizures  Patient has recently had episodes of left sided chest pain that occurs only with lifting certain objects. Patient states that this pain is different from his episodes of NSTEMI in 10/2021. He does endorse exertional dyspnea symptoms.    Current Outpatient Medications:    albuterol (VENTOLIN HFA) 108 (90 Base) MCG/ACT inhaler, Inhale 2 puffs into the lungs every 4 (four) hours as needed for wheezing or shortness of breath., Disp: 18 g, Rfl: 2   amLODipine (NORVASC) 10 MG tablet, Take 1 tablet (10 mg total) by mouth daily., Disp: 30 tablet, Rfl: 5   aspirin 81 MG chewable tablet, Chew 1 tablet (81 mg total) by mouth daily., Disp: 90 tablet, Rfl: 3   budesonide-formoterol (SYMBICORT) 160-4.5 MCG/ACT inhaler, Inhale 2 puffs into the lungs 2 (two) times daily., Disp: 10.2 g, Rfl: 3   clopidogrel (PLAVIX) 75 MG tablet, Take 1 tablet (75 mg total) by mouth daily., Disp: 30 tablet, Rfl: 11   Evolocumab (REPATHA SURECLICK) 935 MG/ML SOAJ, Inject 140 mg into the skin every 14 (fourteen) days. (Patient not taking: Reported on 02/07/2022), Disp: 6 mL, Rfl: 5   furosemide (LASIX) 40 MG tablet, Take 1 tablet (40 mg total) by mouth daily., Disp: 30 tablet, Rfl: 11   levETIRAcetam (KEPPRA) 750 MG tablet, Take 1 tablet (750 mg total) by mouth 2 (two) times daily., Disp: 60 tablet, Rfl: 5   losartan (COZAAR) 100 MG tablet, Take 1 tablet (100 mg total) by mouth daily., Disp: 30 tablet, Rfl: 5   metFORMIN (GLUCOPHAGE-XR) 500 MG 24 hr tablet, Take 2 tablets (1,000 mg total) by mouth daily with breakfast., Disp: 60 tablet, Rfl: 3   metoprolol succinate (TOPROL XL) 25 MG 24 hr tablet, Take 1 tablet (25 mg total) by mouth  daily., Disp: 30 tablet, Rfl: 9   Misc. Devices MISC, Auto bipap with Medium size Fisher[employer] Full Face Mask Simplus mask/ heated humidification. Autopap settings 15-20 cm H2O., Disp: 1 each, Rfl: 0   nicotine polacrilex (NICORETTE) 2 MG gum, Take 1 each (2 mg total) by mouth as needed for smoking cessation. (Patient not taking: Reported on 02/07/2022), Disp: 60 tablet, Rfl: 3   nitroGLYCERIN (NITROSTAT) 0.4 MG SL tablet, Place 1 tablet (0.4 mg total) under the tongue every 5 (five) minutes as needed for chest pain., Disp: 30 tablet, Rfl: 12   rosuvastatin (CRESTOR) 40 MG tablet, Take 1 tablet (40 mg total) by mouth daily., Disp: 30 tablet, Rfl: 11   Varenicline Tartrate, Starter, (CHANTIX STARTING MONTH PAK) 0.5 MG X 11 & 1 MG X 42 TBPK, Initial, 0.5 mg orally once daily for 3 days, then 0.5 mg twice daily on days 4 through 7, and then 1 mg twice daily thereafter for 12 weeks, Disp: 53 each, Rfl: 5   Cardiovascular & other pertient studies:  Reviewed external labs and tests, independently interpreted  Lexiscan/modified Bruce Tetrofosmin stress test 03/26/2022: Lexiscan/modified Bruce nuclear stress test performed using 1-day protocol. Patient walked for 3.9 METS, reached 84% MPHR. Resting hypertension 170/100 mmHg, peak BP 220/94 mmHg. No chest pain reported. Stress EKG is non-diagnostic, as this is pharmacological stress test. In addition, stress EKG at 84% MPHR showed sinus  tachycardia, possible old anteroseptal infarct, poor R wave progression, no significant ST-T abnormality.  SPECT images show small sized, medium intensity, reversible perfusion defect in apical anterolateral myocardium. In addition, there mildly decreased tracer uptake at rest and stress, likely due to diaphragmatic attenuation. Normal wall motion and thickening. Stress LVEF 77%. Low risk study.    EKG 11/06/2021: Sinus rhythm 93 bpm  Old inferior infarct   EKG 10/29/2021: Sinus rhythm Inferior infarct, age indeterminate    Echocardiogram 10/28/2021:  1. Left ventricular ejection fraction, by estimation, is 55 to 60%. The  left ventricle has normal function. The left ventricle has no regional  wall motion abnormalities. Left ventricular diastolic parameters are  indeterminate.   2. Right ventricular systolic function is normal. The right ventricular  size is normal.   3. Left atrial size was mildly dilated.   4. The mitral valve is normal in structure. Mild mitral valve  regurgitation. No evidence of mitral stenosis.   5. The aortic valve is normal in structure. Aortic valve regurgitation is  not visualized. No aortic stenosis is present.    Coronary intervention 10/29/2021: LM: Normal LAD: Ostial to prox diffuse 40% disease Lcx: Large vessel        Mid OM1 (Non-culprit) tandem 60%-80% stenoses RCA: Prox 95% stenosis (Culprit)         Disal focal 60% disease   Successful percutaneous coronary intervention prox RCA and mid OM1        PTCA and stent placement 3.5 X 28 mm Synergy drug-eluting stent-->post dilatation with 4.17m [place] balloon up to 18 atm        PTCA and stent placement 4.0 X 20 mm Synergy drug-eluting stent-->post dilatation with 4.038mNC balloon up to 18 atm   0% residual stenoses in prox RCA and mid OM1 Distal embolization in small caliber distal OM1   Recommend IV nitroglycerin 10 mics an hour overnight, and IV Aggrastat for 6 hours. Recommend DAPT with aspirin and Brilinta for 1 year  Recent labs: 01/02/2022: Glucose 132, BUN/Cr 14/1.1. EGFR 79. Na/K 137/4.3.  HbA1C 6/8%  10/29/2021: Glucose 145, BUN/Cr 10/1.19. EGFR >60. Na/K 133/3.2.  H/H 17/51. MCV 90. Platelets 189 HbA1C 6.6% Chol 193, TG 95, HDL 55, LDL 119 Lipoprotein (a) 220 Trop HS peak 4200    Review of Systems  Cardiovascular:  Positive for chest pain and dyspnea on exertion. Negative for leg swelling, palpitations and syncope.         There were no vitals filed for this visit.   There is no height or weight  on file to calculate BMI. There were no vitals filed for this visit.    Objective:   Physical Exam Vitals and nursing note reviewed.  Constitutional:      General: He is not in acute distress. Neck:     Vascular: No JVD.  Cardiovascular:     Rate and Rhythm: Normal rate and regular rhythm.     Heart sounds: Normal heart sounds. No murmur heard. Pulmonary:     Effort: Pulmonary effort is normal.     Breath sounds: Normal breath sounds. No wheezing or rales.  Musculoskeletal:     Right lower leg: No edema.     Left lower leg: No edema.             Visit diagnoses: No diagnosis found.    No orders of the defined types were placed in this encounter.   No orders of the defined types were placed in this  encounter.    Assessment & Recommendations:   57 y.o. African American male  with hypertension, hyperlipidemia, type 2 DM, obesity, OSA, CAD, h/o polysubstance abuse, h/o prostate cancer, h/o seizures  CAD: NSTEMI 10/2021 Successful PCI to proximal circumflex and mid OM1. Distal embolization of small caliber distal OM1, periprocedural infarct with peak HS tro 4200 Recommend DAPT with aspirin and Plavix for 1 year (could not afford Brilinta). Cautious introduction of metoprolol tartrate 25 mg bid. Counseled regarding ongoing cocaine use. Continue losartan 100 mg daily, amlodipine 10 mg daily. LDL 119, lipoprotein (a) 220. Currently on Crestor to 20 mg daily. Check lipid panel. Re-emphazied importance of cocaine cessation.  Recent chest pain, although different from his angina in 10/2021, are concerning. Recommend exercise nuclear stress test.      Nigel Mormon, MD Pager: [phone] Office: [phone]

## 2022-04-03 ENCOUNTER — Encounter: Payer: Medicaid Other | Admitting: Cardiology

## 2022-04-09 ENCOUNTER — Telehealth: Payer: Self-pay

## 2022-04-09 NOTE — Telephone Encounter (Signed)
Patient requesting results of his stress test.

## 2022-04-09 NOTE — Telephone Encounter (Signed)
I was going to discuss the results at his f/u appt. I think he missed his appt. I will call him later today to discuss.  Thanks MJP

## 2022-04-14 ENCOUNTER — Encounter: Payer: Self-pay | Admitting: Cardiology

## 2022-04-14 ENCOUNTER — Ambulatory Visit: Payer: Medicaid Other | Admitting: Cardiology

## 2022-04-14 ENCOUNTER — Telehealth: Payer: Self-pay

## 2022-04-14 VITALS — BP 148/97 | HR 88 | Ht 69.0 in | Wt 288.0 lb

## 2022-04-14 DIAGNOSIS — E782 Mixed hyperlipidemia: Secondary | ICD-10-CM

## 2022-04-14 DIAGNOSIS — E7841 Elevated Lipoprotein(a): Secondary | ICD-10-CM

## 2022-04-14 DIAGNOSIS — I25118 Atherosclerotic heart disease of native coronary artery with other forms of angina pectoris: Secondary | ICD-10-CM

## 2022-04-14 DIAGNOSIS — R0609 Other forms of dyspnea: Secondary | ICD-10-CM

## 2022-04-14 MED ORDER — FUROSEMIDE 40 MG PO TABS: 40.0000 mg | ORAL_TABLET | Freq: Two times a day (BID) | ORAL | 2 refills | Status: DC

## 2022-04-14 MED ORDER — REPATHA SURECLICK 140 MG/ML ~~LOC~~ SOAJ: 140.0000 mg | SUBCUTANEOUS | 5 refills | Status: AC

## 2022-04-14 NOTE — Telephone Encounter (Signed)
Patient assistance for Re[atha on 04/14/2022. Patient will stop by to drop off patient assistance with income.

## 2022-04-14 NOTE — Progress Notes (Signed)
Follow up visit  Subjective:   Dillon Wang, male    DOB: [birth year], 57 y.o.   MRN: [id number]   HPI  Chief Complaint  Patient presents with   Coronary artery disease of native artery of native heart wi    57 y.o. African American male  with hypertension, hyperlipidemia, type 2 DM, obesity, OSA, CAD, h/o polysubstance abuse, h/o prostate cancer, h/o seizures  Patient denies any chest pain, but has exertional dyspnea with minimal exertion.  He has gained several pounds in the last few months.  He has had orthopnea, but also attributes this to not having his CPAP available.  He also has eczema.  Reviewed recent stress results with the patient.  Of note, patient did indeed have chest pain when he had his ACS, but has not had any chest pain recently.    Current Outpatient Medications:    albuterol (VENTOLIN HFA) 108 (90 Base) MCG/ACT inhaler, Inhale 2 puffs into the lungs every 4 (four) hours as needed for wheezing or shortness of breath., Disp: 18 g, Rfl: 2   amLODipine (NORVASC) 10 MG tablet, Take 1 tablet (10 mg total) by mouth daily., Disp: 30 tablet, Rfl: 5   aspirin 81 MG chewable tablet, Chew 1 tablet (81 mg total) by mouth daily., Disp: 90 tablet, Rfl: 3   budesonide-formoterol (SYMBICORT) 160-4.5 MCG/ACT inhaler, Inhale 2 puffs into the lungs 2 (two) times daily., Disp: 10.2 g, Rfl: 3   clopidogrel (PLAVIX) 75 MG tablet, Take 1 tablet (75 mg total) by mouth daily., Disp: 30 tablet, Rfl: 11   Evolocumab (REPATHA SURECLICK) XX123456 MG/ML SOAJ, Inject 140 mg into the skin every 14 (fourteen) days., Disp: 6 mL, Rfl: 5   furosemide (LASIX) 40 MG tablet, Take 1 tablet (40 mg total) by mouth daily., Disp: 30 tablet, Rfl: 11   levETIRAcetam (KEPPRA) 750 MG tablet, Take 1 tablet (750 mg total) by mouth 2 (two) times daily., Disp: 60 tablet, Rfl: 5   losartan (COZAAR) 100 MG tablet, Take 1 tablet (100 mg total) by mouth daily., Disp: 30 tablet, Rfl: 5   metFORMIN (GLUCOPHAGE-XR) 500 MG 24  hr tablet, Take 2 tablets (1,000 mg total) by mouth daily with breakfast., Disp: 60 tablet, Rfl: 3   metoprolol succinate (TOPROL XL) 25 MG 24 hr tablet, Take 1 tablet (25 mg total) by mouth daily., Disp: 30 tablet, Rfl: 9   Misc. Devices MISC, Auto bipap with Medium size [employer] Full Face Mask Simplus mask/ heated humidification. Autopap settings 15-20 cm H2O., Disp: 1 each, Rfl: 0   nicotine polacrilex (NICORETTE) 2 MG gum, Take 1 each (2 mg total) by mouth as needed for smoking cessation., Disp: 60 tablet, Rfl: 3   nitroGLYCERIN (NITROSTAT) 0.4 MG SL tablet, Place 1 tablet (0.4 mg total) under the tongue every 5 (five) minutes as needed for chest pain., Disp: 30 tablet, Rfl: 12   rosuvastatin (CRESTOR) 40 MG tablet, Take 1 tablet (40 mg total) by mouth daily., Disp: 30 tablet, Rfl: 11   Varenicline Tartrate, Starter, (CHANTIX STARTING MONTH PAK) 0.5 MG X 11 & 1 MG X 42 TBPK, Initial, 0.5 mg orally once daily for 3 days, then 0.5 mg twice daily on days 4 through 7, and then 1 mg twice daily thereafter for 12 weeks, Disp: 53 each, Rfl: 5   Cardiovascular & other pertient studies:  Reviewed external labs and tests, independently interpreted  EKG 04/14/2022: Sinus rhythm 89 bpm Nonspecific T-abnormality  Lexiscan/modified Bruce Tetrofosmin stress test 03/26/2022: Lexiscan/modified  Bruce nuclear stress test performed using 1-day protocol. Patient walked for 3.9 METS, reached 84% MPHR. Resting hypertension 170/100 mmHg, peak BP 220/94 mmHg. No chest pain reported. Stress EKG is non-diagnostic, as this is pharmacological stress test. In addition, stress EKG at 84% MPHR showed sinus tachycardia, possible old anteroseptal infarct, poor R wave progression, no significant ST-T abnormality.  SPECT images show small sized, medium intensity, reversible perfusion defect in apical anterolateral myocardium. In addition, there mildly decreased tracer uptake at rest and stress, likely due to diaphragmatic  attenuation. Normal wall motion and thickening. Stress LVEF 77%. Low risk study.    Echocardiogram 10/28/2021:  1. Left ventricular ejection fraction, by estimation, is 55 to 60%. The  left ventricle has normal function. The left ventricle has no regional  wall motion abnormalities. Left ventricular diastolic parameters are  indeterminate.   2. Right ventricular systolic function is normal. The right ventricular  size is normal.   3. Left atrial size was mildly dilated.   4. The mitral valve is normal in structure. Mild mitral valve  regurgitation. No evidence of mitral stenosis.   5. The aortic valve is normal in structure. Aortic valve regurgitation is  not visualized. No aortic stenosis is present.    Coronary intervention 10/29/2021: LM: Normal LAD: Ostial to prox diffuse 40% disease Lcx: Large vessel        Mid OM1 (Non-culprit) tandem 60%-80% stenoses RCA: Prox 95% stenosis (Culprit)         Disal focal 60% disease   Successful percutaneous coronary intervention prox RCA and mid OM1        PTCA and stent placement 3.5 X 28 mm Synergy drug-eluting stent-->post dilatation with 4.14m [place] balloon up to 18 atm        PTCA and stent placement 4.0 X 20 mm Synergy drug-eluting stent-->post dilatation with 4.017mNC balloon up to 18 atm   0% residual stenoses in prox RCA and mid OM1 Distal embolization in small caliber distal OM1   Recommend IV nitroglycerin 10 mics an hour overnight, and IV Aggrastat for 6 hours. Recommend DAPT with aspirin and Brilinta for 1 year    Recent labs: 02/19/2022: Glucose 151, BUN/Cr 15/1.1. EGFR 79. Na/K 141/4.3.  Chol 159, TG 111, HDL 51, LDL 88 Lipoprotein (a) 195  01/02/2022: Glucose 132, BUN/Cr 14/1.1. EGFR 79. Na/K 137/4.3.  HbA1C 6/8%  10/29/2021: Glucose 145, BUN/Cr 10/1.19. EGFR >60. Na/K 133/3.2.  H/H 17/51. MCV 90. Platelets 189 HbA1C 6.6% Chol 193, TG 95, HDL 55, LDL 119 Lipoprotein (a) 220 Trop HS peak 4200    Review of Systems   Cardiovascular:  Positive for dyspnea on exertion and leg swelling. Negative for chest pain, palpitations and syncope.         Vitals:   04/14/22 1044 04/14/22 1050  BP: (!) 148/106 (!) 148/97  Pulse: 89 88  SpO2: 94% 94%    Body mass index is 42.53 kg/m. Filed Weights   04/14/22 1044  Weight: 288 lb (130.6 kg)     Objective:   Physical Exam Vitals and nursing note reviewed.  Constitutional:      General: He is not in acute distress. Neck:     Vascular: No JVD.  Cardiovascular:     Rate and Rhythm: Normal rate and regular rhythm.     Heart sounds: Normal heart sounds. No murmur heard. Pulmonary:     Effort: Pulmonary effort is normal.     Breath sounds: Normal breath sounds. No wheezing or rales.  Musculoskeletal:     Right lower leg: Edema (1+) present.     Left lower leg: Edema (1+) present.             Visit diagnoses:   ICD-10-CM   1. Coronary artery disease of native artery of native heart with stable angina pectoris (HCC)  I25.118 EKG 12-Lead    Evolocumab (REPATHA SURECLICK) XX123456 MG/ML SOAJ    Lipid panel    Lipid panel    2. Mixed hyperlipidemia  E78.2 Evolocumab (REPATHA SURECLICK) XX123456 MG/ML SOAJ    Lipid panel    Lipid panel    3. Elevated lipoprotein(a)  E78.41 Evolocumab (REPATHA SURECLICK) XX123456 MG/ML SOAJ    4. Exertional dyspnea  R06.09        Orders Placed This Encounter  Procedures   Lipid panel   EKG 12-Lead    Meds ordered this encounter  Medications   Evolocumab (REPATHA SURECLICK) XX123456 MG/ML SOAJ    Sig: Inject 140 mg into the skin every 14 (fourteen) days.    Dispense:  6 mL    Refill:  5   furosemide (LASIX) 40 MG tablet    Sig: Take 1 tablet (40 mg total) by mouth 2 (two) times daily.    Dispense:  60 tablet    Refill:  2    IM program     Assessment & Recommendations:   57 y.o. African American male  with hypertension, hyperlipidemia, type 2 DM, obesity, OSA, CAD, h/o polysubstance abuse, h/o prostate cancer,  h/o seizures  Exertional dyspnea: Likely multifactorial.  Normal EF on echocardiogram and recent stress test, could have component of HFpEF. Hypertension obesity, as well as inadequate treatment of OSA are certainly contributing. Increase Lasix to 40 mg twice daily. Recommend low-salt diet regular weight check.  CAD: NSTEMI 10/2021 Successful PCI to proximal circumflex and mid OM1. Distal embolization of small caliber distal OM1, periprocedural infarct with peak HS tro 4200 Recommend DAPT with aspirin and Plavix for 1 year (could not afford Brilinta) till 10/2022. In addition to statin, added Repatha. Continue metoprolol tartrate, amlodipine. Repeat lipid panel 3 months  Mild apical anterolateral ischemia without any symptoms of chest pain.  Continue medical management at this time.  No plans for immediate coronary angiography or heart catheterization.  F/u in 3 months     Nigel Mormon, MD Pager: [phone] Office: [phone]

## 2022-04-22 ENCOUNTER — Encounter: Payer: Medicaid Other | Admitting: Internal Medicine

## 2022-04-22 ENCOUNTER — Encounter: Payer: Self-pay | Admitting: Internal Medicine

## 2022-04-22 NOTE — Progress Notes (Deleted)
CC: 2 month follow up  HPI:  [name] is a 57 y.o. with medical history of HTN, HLD, CAD, hx MI s/p PCI, OSA, GERD presenting to [facility] for a follow up.   Please see problem-based list for further details, assessments, and plans.  Past Medical History:  Diagnosis Date   Acute chest pain 10/27/2021   Acute hypoxemic respiratory failure ([place]) 02/20/2018   Atypical chest pain 09/20/2017   Cancer ([place])    prostate   Daytime somnolence 09/20/2017   Diabetes mellitus without complication (HCC)    Elevated lipoprotein(a) 11/06/2021   GERD (gastroesophageal reflux disease)    Hyperlipidemia    Hypertension    Obstructive sleep apnea 09/20/2017   Polysubstance abuse ([place]) 10/27/2021   Seizures ([place])    Sleep apnea    Tussive syncope 08/05/2018   Tussive syncope 08/05/2018    Current Outpatient Medications (Endocrine & Metabolic):    metFORMIN (GLUCOPHAGE-XR) 500 MG 24 hr tablet, Take 2 tablets (1,000 mg total) by mouth daily with breakfast.  Current Outpatient Medications (Cardiovascular):    amLODipine (NORVASC) 10 MG tablet, Take 1 tablet (10 mg total) by mouth daily.   Evolocumab (REPATHA SURECLICK) [id number] MG/ML SOAJ, Inject 140 mg into the skin every 14 (fourteen) days.   furosemide (LASIX) 40 MG tablet, Take 1 tablet (40 mg total) by mouth 2 (two) times daily.   losartan (COZAAR) 100 MG tablet, Take 1 tablet (100 mg total) by mouth daily.   metoprolol succinate (TOPROL XL) 25 MG 24 hr tablet, Take 1 tablet (25 mg total) by mouth daily.   nitroGLYCERIN (NITROSTAT) 0.4 MG SL tablet, Place 1 tablet (0.4 mg total) under the tongue every 5 (five) minutes as needed for chest pain.   rosuvastatin (CRESTOR) 40 MG tablet, Take 1 tablet (40 mg total) by mouth daily.  Current Outpatient Medications (Respiratory):    albuterol (VENTOLIN HFA) 108 (90 Base) MCG/ACT inhaler, Inhale 2 puffs into the lungs every 4 (four) hours as needed for wheezing or shortness of breath.   budesonide-formoterol  (SYMBICORT) 160-4.5 MCG/ACT inhaler, Inhale 2 puffs into the lungs 2 (two) times daily.  Current Outpatient Medications (Analgesics):    aspirin 81 MG chewable tablet, Chew 1 tablet (81 mg total) by mouth daily.  Current Outpatient Medications (Hematological):    clopidogrel (PLAVIX) 75 MG tablet, Take 1 tablet (75 mg total) by mouth daily.  Current Outpatient Medications (Other):    levETIRAcetam (KEPPRA) 750 MG tablet, Take 1 tablet (750 mg total) by mouth 2 (two) times daily.   Misc. Devices MISC, Auto bipap with Medium size Fisher&Paykel Full Face Mask Simplus mask/ heated humidification. Autopap settings 15-20 cm H2O.   nicotine polacrilex (NICORETTE) 2 MG gum, Take 1 each (2 mg total) by mouth as needed for smoking cessation.   Varenicline Tartrate, Starter, (CHANTIX STARTING MONTH PAK) 0.5 MG X 11 & 1 MG X 42 TBPK, Initial, 0.5 mg orally once daily for 3 days, then 0.5 mg twice daily on days 4 through 7, and then 1 mg twice daily thereafter for 12 weeks  Review of Systems:  Review of system negative unless stated in the problem list or HPI.    Physical Exam:  There were no vitals filed for this visit.  Physical Exam General: NAD HENT: NCAT Lungs: CTAB, no wheeze, rhonchi or rales.  Cardiovascular: Normal heart sounds, no r/m/g, 2+ pulses in all extremities. No LE edema Abdomen: No TTP, normal bowel sounds MSK: No asymmetry or muscle atrophy.  Skin: no lesions noted on exposed skin Neuro: Alert and oriented x4. CN grossly intact Psych: Normal mood and normal affect   Assessment & Plan:   No problem-specific Assessment & Plan notes found for this encounter.   See Encounters Tab for problem based charting.  Patient discussed with Dr. {NAMES:[id number]::"Guilloud","Hoffman","Mullen","Narendra","Vincent","Machen","Lau","Hatcher"} [name], MD Tillie Rung. [facility] Internal Medicine Residency, PGY-2   HTN/stable angina Amlodipine 45m, Losartan 1063mqd, Lasix  4029mID, and Metoprolol succinate 5m54m. Low risk stress test on 03/2022. Renal fxn normal in 01/2022. Can restart spiro if needed.   HLD Repatha 140 mg q2 weeks. Crestor 40 mg qd.  CAD Plavix 75 mg qd, aspirin 81 mg qd.   COPD  Symbicort BID, albuterol prn  DMII Metformin 1000 mg qd. Last A1c in 01/2022 was 6.8. Repeat A1c today.   Care Gaps: hep C and eye exam follow up. Will discuss colon cancer screening.    Smoking cessation was given chantix.

## 2022-04-27 NOTE — Progress Notes (Unsigned)
CC: HTN and DMII follow up  HPI:  Dillon Wang is a 57 y.o. with medical history of HTN, HLD, DMII, OSA, tobacco use disorder and GERD presenting to [facility] for follow up on HTN and DMII.   Please see problem-based list for further details, assessments, and plans.  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  Current Outpatient Medications (Cardiovascular):    amLODipine (NORVASC) 10 MG tablet, Take 1 tablet (10 mg total) by mouth daily.   Evolocumab (REPATHA SURECLICK) [id number] MG/ML SOAJ, Inject 140 mg into the skin every 14 (fourteen) days.   furosemide (LASIX) 40 MG tablet, Take 1 tablet (40 mg total) by mouth 2 (two) times daily.   losartan (COZAAR) 100 MG tablet, Take 1 tablet (100 mg total) by mouth daily.   metoprolol succinate (TOPROL XL) 25 MG 24 hr tablet, Take 1 tablet (25 mg total) by mouth daily.   nitroGLYCERIN (NITROSTAT) 0.4 MG SL tablet, Place 1 tablet (0.4 mg total) under the tongue every 5 (five) minutes as needed for chest pain.   rosuvastatin (CRESTOR) 40 MG tablet, Take 1 tablet (40 mg total) by mouth daily.  Current Outpatient Medications (Respiratory):    albuterol (VENTOLIN HFA) 108 (90 Base) MCG/ACT inhaler, Inhale 2 puffs into the lungs every 4 (four) hours as  needed for wheezing or shortness of breath.   budesonide-formoterol (SYMBICORT) 160-4.5 MCG/ACT inhaler, Inhale 2 puffs into the lungs 2 (two) times daily.  Current Outpatient Medications (Analgesics):    aspirin 81 MG chewable tablet, Chew 1 tablet (81 mg total) by mouth daily.  Current Outpatient Medications (Hematological):    clopidogrel (PLAVIX) 75 MG tablet, Take 1 tablet (75 mg total) by mouth daily.  Current Outpatient Medications (Other):    levETIRAcetam (KEPPRA) 750 MG tablet, Take 1 tablet (750 mg total) by mouth 2 (two) times daily.   Misc. Devices MISC, Auto bipap with Medium size [employer] Full Face Mask Simplus mask/ heated humidification. Autopap settings 15-20 cm H2O.   nicotine polacrilex (NICORETTE) 2 MG gum, Take 1 each (2 mg total) by mouth as needed for smoking cessation.   Varenicline Tartrate, Starter, (CHANTIX STARTING MONTH PAK) 0.5 MG X 11 & 1 MG X 42 TBPK, Initial, 0.5 mg orally once daily for 3 days, then 0.5 mg twice daily on days 4 through 7, and then 1 mg twice daily thereafter for 12 weeks  Review of Systems:  Review of system negative unless stated in the problem list or HPI.    Physical Exam:  Vitals:   04/28/22 1004 04/28/22 1015  BP: (!) 168/94 (!) 154/89  Pulse: 87 86  Temp: 98.1 F (36.7 C)   TempSrc: Oral   SpO2: 96%   Weight: 282 lb 8 oz (128.1 kg)  Height: '5\' 9"'$  (1.753 m)     Physical Exam General: NAD HENT: NCAT Lungs: CTAB, no wheeze, rhonchi or rales.  Cardiovascular: Normal heart sounds, no r/m/g, 2+ pulses in all extremities. No LE edema Abdomen: No TTP, normal bowel sounds MSK: No asymmetry or muscle atrophy.  Skin: no lesions noted on exposed skin Neuro: Alert and oriented x4. CN grossly intact Psych: Normal mood and normal affect   Assessment & Plan:   No problem-specific Assessment & Plan notes found for this encounter.   See Encounters Tab for problem based charting.  Patient discussed with Dr.  {NAMES:[id number]::"Guilloud","Hoffman","Mullen","Narendra","Vincent","Machen","Lau","Hatcher"} Dillon Schuller, MD Tillie Rung. [facility] Internal Medicine Residency, PGY-2   DMII A1c this visit. Previous A1c was 6.8 3 months ago.    HTN On amlodipine 10 mg , losartan 100 mg qd, Lasix 40 mg qd, Metoprolol 25 mg qd. BMP with with normal renal fxn in 01/2022. ACR normal. Was on spiro but held due to hx of AKI. Lasix increased to 40 mg BID by the neurologist due to swelling in the legs. Has been on lasix since his heart attack. Not taking amlodipine. Normal echo with indeterminate diastolic function. Will restart 5 mg amlodipine.   HLD On Crestor 40 mg qd this was increased from 20 mg in December. Startd on repatha q2weeks. Can check Lipid again as pt was uncontrolled. Cardiology repatha.   HM Hep C Eye exam- will give number Colonoscopy-will need referral.   Weight increase from 271 to 282.

## 2022-04-28 ENCOUNTER — Encounter: Payer: Self-pay | Admitting: Internal Medicine

## 2022-04-28 ENCOUNTER — Ambulatory Visit: Payer: BLUE CROSS/BLUE SHIELD | Admitting: Internal Medicine

## 2022-04-28 ENCOUNTER — Other Ambulatory Visit: Payer: Self-pay

## 2022-04-28 VITALS — BP 154/89 | HR 86 | Temp 98.1°F | Ht 69.0 in | Wt 282.5 lb

## 2022-04-28 DIAGNOSIS — E782 Mixed hyperlipidemia: Secondary | ICD-10-CM | POA: Diagnosis not present

## 2022-04-28 DIAGNOSIS — I10 Essential (primary) hypertension: Secondary | ICD-10-CM

## 2022-04-28 DIAGNOSIS — I110 Hypertensive heart disease with heart failure: Secondary | ICD-10-CM

## 2022-04-28 DIAGNOSIS — I509 Heart failure, unspecified: Secondary | ICD-10-CM

## 2022-04-28 DIAGNOSIS — E1165 Type 2 diabetes mellitus with hyperglycemia: Secondary | ICD-10-CM | POA: Diagnosis not present

## 2022-04-28 DIAGNOSIS — K635 Polyp of colon: Secondary | ICD-10-CM | POA: Diagnosis not present

## 2022-04-28 DIAGNOSIS — J45909 Unspecified asthma, uncomplicated: Secondary | ICD-10-CM

## 2022-04-28 DIAGNOSIS — F17210 Nicotine dependence, cigarettes, uncomplicated: Secondary | ICD-10-CM

## 2022-04-28 DIAGNOSIS — Z1159 Encounter for screening for other viral diseases: Secondary | ICD-10-CM

## 2022-04-28 DIAGNOSIS — Z0000 Encounter for general adult medical examination without abnormal findings: Secondary | ICD-10-CM

## 2022-04-28 MED ORDER — BUDESONIDE-FORMOTEROL FUMARATE 160-4.5 MCG/ACT IN AERO: 2.0000 | INHALATION_SPRAY | Freq: Two times a day (BID) | RESPIRATORY_TRACT | 3 refills | Status: DC

## 2022-04-28 MED ORDER — ALBUTEROL SULFATE HFA 108 (90 BASE) MCG/ACT IN AERS: 2.0000 | INHALATION_SPRAY | RESPIRATORY_TRACT | 2 refills | Status: DC | PRN

## 2022-04-28 NOTE — Patient Instructions (Signed)
[name], it was a pleasure seeing you today! You endorsed feeling well today. Below are some of the things we talked about this visit. We look forward to seeing you in the follow up appointment!  Today we discussed: I will restart your amlodipine at 5 mg back. Continue your other medications.  We will check lab work today.  We will also order ultrasound of the heart.  I placed a referral  for colonoscopy.   I have ordered the following labs today:  Lab Orders         Lipid Profile         Hemoglobin A1c         BMP8+Anion Gap         Hepatitis C Ab reflex to Quant PCR         Interpretation:       Referrals ordered today:   Referral Orders         Ambulatory referral to Gastroenterology      I have ordered the following medication/changed the following medications:   Stop the following medications: Medications Discontinued During This Encounter  Medication Reason   budesonide-formoterol (SYMBICORT) 160-4.5 MCG/ACT inhaler Reorder   albuterol (VENTOLIN HFA) 108 (90 Base) MCG/ACT inhaler Reorder     Start the following medications: Meds ordered this encounter  Medications   albuterol (VENTOLIN HFA) 108 (90 Base) MCG/ACT inhaler    Sig: Inhale 2 puffs into the lungs every 4 (four) hours as needed for wheezing or shortness of breath.    Dispense:  18 g    Refill:  2   budesonide-formoterol (SYMBICORT) 160-4.5 MCG/ACT inhaler    Sig: Inhale 2 puffs into the lungs 2 (two) times daily.    Dispense:  10.2 g    Refill:  3     Follow-up: 3-4 week follow up   Please make sure to arrive 15 minutes prior to your next appointment. If you arrive late, you may be asked to reschedule.   We look forward to seeing you next time. Please call our clinic at [phone] if you have any questions or concerns. The best time to call is Monday-Friday from 9am-4pm, but there is someone available 24/7. If after hours or the weekend, call the main hospital number and ask for the Internal  Medicine Resident On-Call. If you need medication refills, please notify your pharmacy one week in advance and they will send [place] a request.  Thank you for letting us take part in your care. Wishing you the best!  Thank you, Dillon Schuller, MD

## 2022-04-29 LAB — HCV AB W REFLEX TO QUANT PCR: HCV Ab: NONREACTIVE

## 2022-04-29 LAB — BMP8+ANION GAP
Anion Gap: 17 mmol/L (ref 10.0–18.0)
BUN/Creatinine Ratio: 13 (ref 9–20)
BUN: 16 mg/dL (ref 6–24)
CO2: 21 mmol/L (ref 20–29)
Calcium: 9.2 mg/dL (ref 8.7–10.2)
Chloride: 97 mmol/L (ref 96–106)
Creatinine, Ser: 1.21 mg/dL (ref 0.76–1.27)
Glucose: 423 mg/dL — ABNORMAL HIGH (ref 70–99)
Potassium: 4.4 mmol/L (ref 3.5–5.2)
Sodium: 135 mmol/L (ref 134–144)
eGFR: 71 mL/min/{1.73_m2} (ref >59)

## 2022-04-29 LAB — LIPID PANEL
Chol/HDL Ratio: 4.6 ratio (ref 0.0–5.0)
Cholesterol, Total: 110 mg/dL (ref 100–199)
HDL: 24 mg/dL — ABNORMAL LOW (ref >39)
LDL Chol Calc (NIH): 31 mg/dL (ref 0–99)
Triglycerides: 379 mg/dL — ABNORMAL HIGH (ref 0–149)
VLDL Cholesterol Cal: 55 mg/dL — ABNORMAL HIGH (ref 5–40)

## 2022-04-29 LAB — HEMOGLOBIN A1C
Est. average glucose Bld gHb Est-mCnc: 260 mg/dL
Hgb A1c MFr Bld: 10.7 % — ABNORMAL HIGH (ref 4.8–5.6)

## 2022-04-29 LAB — HCV INTERPRETATION

## 2022-04-29 NOTE — Assessment & Plan Note (Addendum)
Pt has hx of HTN. He is supposed to be on amlodipine 10 mg, losartan 100 mg qd, Lasix 40 mg qd, Metoprolol 25 mg qd. Pt not taking amlodipine and states this was stopped during the last visit. BMP with with normal renal fxn in 01/2022. ACR normal. Was on spiro but held due to hx of AKI. Lasix increased to 40 mg BID by the cardiologist due to swelling in the legs. Has been on lasix since his heart attack. Not taking amlodipine. Normal echo with indeterminate diastolic function. Will restart 5 mg amlodipine. Pt euvolemic today, so will advise him to use lasix 40 mg qd. Advised him to weigh daily and if >3 lbs in one day then take an extra dose. Will follow up in 3-4 weeks for HTN.

## 2022-04-29 NOTE — Assessment & Plan Note (Addendum)
A1c this visit increased to 10.7. Previous A1c was 6.8 3 months ago. Pt taking metformin 1000 mg daily. Will increase to 1000 mg BID. Will discuss adding GLP1 agonist and SGLT2 inhibitor to his regimen.

## 2022-04-29 NOTE — Assessment & Plan Note (Signed)
Hep C ordered Colonoscopy referral placed.

## 2022-04-29 NOTE — Assessment & Plan Note (Signed)
On Crestor 40 mg qd this was increased from 20 mg in December. Startd on repatha q2weeks. Can recheck lipid as pt was uncontrolled. Cardiology started repatha but pt unable to pick it up. LDL improved to 31 which is at goal <70. Will message his cardiologist on this improvement as repatha has not been started yet but ordered.

## 2022-04-30 ENCOUNTER — Telehealth: Payer: Self-pay | Admitting: Student

## 2022-04-30 DIAGNOSIS — I10 Essential (primary) hypertension: Secondary | ICD-10-CM

## 2022-04-30 MED ORDER — AMLODIPINE BESYLATE 10 MG PO TABS: 10.0000 mg | ORAL_TABLET | Freq: Every day | ORAL | 5 refills | Status: DC

## 2022-04-30 NOTE — Telephone Encounter (Signed)
Spoke to patient on the phone.  Requested amlodipine and diabetes injectable to be sent to [facility] in [place].  I refilled amlodipine.  I noticed that this patient had a conversation with Dr. Chancy Milroy about starting GLP-1 agonist during 04/28/2022 visit, but this medication was not ordered.

## 2022-05-01 MED ORDER — OZEMPIC (0.25 OR 0.5 MG/DOSE) 2 MG/1.5ML ~~LOC~~ SOPN: 0.2500 mg | PEN_INJECTOR | SUBCUTANEOUS | 0 refills | Status: AC

## 2022-05-01 NOTE — Addendum Note (Signed)
Addended by: [name] on: 05/01/2022 07:06 AM   Modules accepted: Orders

## 2022-05-05 NOTE — Progress Notes (Signed)
Internal Medicine Clinic Attending  Case discussed with Dr. Khan  at the time of the visit.  We reviewed the resident's history and exam and pertinent patient test results.  I agree with the assessment, diagnosis, and plan of care documented in the resident's note.  

## 2022-05-05 NOTE — Addendum Note (Signed)
Addended by: Gilles Chiquito B on: 05/05/2022 10:26 AM   Modules accepted: Level of Service

## 2022-05-19 ENCOUNTER — Encounter: Payer: Self-pay | Admitting: Student

## 2022-05-19 ENCOUNTER — Ambulatory Visit (INDEPENDENT_AMBULATORY_CARE_PROVIDER_SITE_OTHER): Payer: BLUE CROSS/BLUE SHIELD | Admitting: Student

## 2022-05-19 VITALS — BP 133/78 | HR 86 | Temp 97.6°F | Resp 28 | Ht 69.0 in | Wt 273.9 lb

## 2022-05-19 DIAGNOSIS — R0609 Other forms of dyspnea: Secondary | ICD-10-CM

## 2022-05-19 DIAGNOSIS — F17200 Nicotine dependence, unspecified, uncomplicated: Secondary | ICD-10-CM

## 2022-05-19 DIAGNOSIS — I10 Essential (primary) hypertension: Secondary | ICD-10-CM

## 2022-05-19 DIAGNOSIS — I2510 Atherosclerotic heart disease of native coronary artery without angina pectoris: Secondary | ICD-10-CM

## 2022-05-19 DIAGNOSIS — E1165 Type 2 diabetes mellitus with hyperglycemia: Secondary | ICD-10-CM

## 2022-05-19 DIAGNOSIS — F17210 Nicotine dependence, cigarettes, uncomplicated: Secondary | ICD-10-CM

## 2022-05-19 DIAGNOSIS — F1010 Alcohol abuse, uncomplicated: Secondary | ICD-10-CM | POA: Diagnosis not present

## 2022-05-19 DIAGNOSIS — Z0000 Encounter for general adult medical examination without abnormal findings: Secondary | ICD-10-CM

## 2022-05-19 MED ORDER — NICOTINE POLACRILEX 2 MG MT GUM: 2.0000 mg | CHEWING_GUM | OROMUCOSAL | 3 refills | Status: DC | PRN

## 2022-05-19 MED ORDER — NICOTINE 14 MG/24HR TD PT24: 14.0000 mg | MEDICATED_PATCH | TRANSDERMAL | 2 refills | Status: DC

## 2022-05-19 NOTE — Patient Instructions (Addendum)
It was a pleasure seeing you in clinic today  Diabetes Increase metformin to 1000 mg twice daily Please pick up Ozempic if this not affordable please call and we an try a different medication  Smoking cessation Please start nicotine 14 mg daily and nicotine gum as needed to help with craving and to cut down on how much you smoke  Dyspnea on exertion I have ordered pulmonary function testing.  You should be called to have this done to see if your difficulty breathing may be caused by your lungs.  Please call the GI office to make an appointment for colonoscopy their information is listed below: [facility] Gastroenterologist in [place], [place] in: [facility] [place] Address: [address] [place], [place], [place] [address]   Phone: [phone]  Follow-up in 2 months

## 2022-05-21 NOTE — Assessment & Plan Note (Signed)
Patient smoking approximately 1 cigar a day no longer smoking cigarettes.  Appears he was started on Chantix several months ago.  He states he was not able to tolerate this due to vivid dreams.  History is of seizures and cannot use bupropion.  He is still interested in smoking cessation and is trying to cut down.  Did try patches before with some effect.  Will restart him on 14 mg nicotine patches and nicotine gum and follow-up in 2 months.

## 2022-05-21 NOTE — Assessment & Plan Note (Signed)
Currently on metformin daily.  He did not pick up Ozempic as he was not sure if this was covered by insurance.  Reports fasting CBGs around 1 25-1 50 likely.  Quite elevated glucose levels in the setting of drinking liquor and beer frequently.  He has stopped drinking any alcohol lately and states his sugars appear to be doing well at home.  Advise he pick up his Ozempic and call us if this is not affordable.    increase metformin to twice daily as discussed at last visit Start Ozempic 0.25 mg weekly  Has appoint with ophthalmology in June Follow-up 2 months

## 2022-05-21 NOTE — Assessment & Plan Note (Addendum)
Patient continues to have dyspnea with exertion cannot walk or stand for long.'s due to this.  Has had previous cardiac workup after NSTEMI on 10/2021. Status post PCI and DAPT with Plavix and aspirin.  No longer having chest pain lipid profile much improved.  He is wearing his CPAP nightly.  BP is better controlled currently.  However he reports he is still using albuterol once for exertional dyspnea.  He denies any wheezing but does cough frequently.  He denies orthopnea or lower extremity swelling.  Weight is stable and does not suggest heart failure exacerbation patient is contributing. He endorses a smoking history of 1-1/2 packs a day for 20 years and currently smoking cigars daily.  Despite management of his cardiac conditions and OSA does not seem to have much improvement in his dyspnea given his smoking history we will consider obstructive lung pathology may be contributing to his symptoms.  Will check PFTs to further evaluate.

## 2022-05-21 NOTE — Assessment & Plan Note (Signed)
Has not scheduled with GI for colonoscopy given him office contact information.

## 2022-05-21 NOTE — Progress Notes (Signed)
Internal Medicine Clinic Attending ? ?Case discussed with Dr. Liang  At the time of the visit.  We reviewed the resident?s history and exam and pertinent patient test results.  I agree with the assessment, diagnosis, and plan of care documented in the resident?s note. ? ?

## 2022-05-21 NOTE — Assessment & Plan Note (Signed)
Reports he is no longer drinking alcohol.  Since visit about 1 month ago.  Denied any significant withdrawal symptoms, seizures, hallucinations, delirium.  Advised continued alcohol cessation.

## 2022-05-21 NOTE — Assessment & Plan Note (Signed)
BP well-controlled on amlodipine 10 mg, losartan 100 mg, Lasix 40 mg, and metoprolol 25 mg daily.  Will believe amlodipine 5 mg was started at his last visit but he seems to be taking 1 tablet a day.  No significant increase in leg swelling with addition of amlodipine and weight seems stable compared to last visit.  Given his blood pressure is well-controlled we will continue him on current medications.

## 2022-05-21 NOTE — Progress Notes (Signed)
Established Patient Office Visit  Subjective   Patient ID: Dillon Wang, male    DOB: [birth year]  Age: 57 y.o. MRN: [id number]  Chief Complaint  Patient presents with   Follow-up   Results    Blood work    Dillon Wang is a 57 y.o. person living with a history listed below who presents to clinic for follow up of diabetes and dyspnea on exertion. Please refer to problem based charting for further details and assessment and plan of current problem and chronic medical conditions.      Patient Active Problem List   Diagnosis Date Noted   Coronary artery disease of native artery of native heart with stable angina pectoris ([place]) 02/07/2022   Exertional dyspnea 02/07/2022   Seizures ([place]) 12/26/2021   Healthcare maintenance 12/26/2021   History of non-ST elevation myocardial infarction (NSTEMI) 11/06/2021   Hyperlipidemia associated with type 2 diabetes mellitus ([place]) 11/06/2021   Type 2 diabetes mellitus with hyperglycemia, without long-term current use of insulin ([place]) 09/20/2018   Obstructive sleep apnea 09/20/2017   Morbid obesity ([place]) 09/20/2017   Hypertension    Cocaine abuse ([place])    Tobacco use disorder    Alcohol abuse       Review of Systems  Respiratory:  Positive for cough, sputum production and shortness of breath (with exhertion).   All other systems reviewed and are negative.     Objective:     BP 133/78 (BP Location: Right Arm, Patient Position: Sitting, Cuff Size: Large)   Pulse 86   Temp 97.6 F (36.4 C) (Oral)   Resp (!) 28   Ht 5\' 9"  (1.753 m)   Wt 273 lb 14.4 oz (124.2 kg)   SpO2 96% Comment: room air  BMI 40.45 kg/m  BP Readings from Last 3 Encounters:  05/19/22 133/78  04/28/22 (!) 154/89  04/14/22 (!) 148/97      Physical Exam Constitutional:      Appearance: Normal appearance.  HENT:     Head: Normocephalic and atraumatic.     Mouth/Throat:     Mouth: Mucous membranes are moist.     Pharynx: Oropharynx is clear.  Eyes:      Extraocular Movements: Extraocular movements intact.     Conjunctiva/sclera: Conjunctivae normal.     Pupils: Pupils are equal, round, and reactive to light.  Cardiovascular:     Rate and Rhythm: Normal rate and regular rhythm.     Pulses: Normal pulses.     Heart sounds: No murmur heard.    Comments: No JVD Pulmonary:     Effort: Pulmonary effort is normal. No respiratory distress.     Breath sounds: Normal breath sounds. No wheezing, rhonchi or rales.  Abdominal:     General: Abdomen is flat. Bowel sounds are normal. There is no distension.     Palpations: Abdomen is soft.     Tenderness: There is no abdominal tenderness.  Musculoskeletal:        General: Normal range of motion.     Cervical back: Normal range of motion.     Right lower leg: No edema.     Left lower leg: No edema.  Skin:    General: Skin is warm and dry.     Capillary Refill: Capillary refill takes less than 2 seconds.  Neurological:     General: No focal deficit present.     Mental Status: He is alert and oriented to person, place, and time.  Psychiatric:  Mood and Affect: Mood normal.        Behavior: Behavior normal.      No results found for any visits on 05/19/22.  Last hemoglobin A1c Lab Results  Component Value Date   HGBA1C 10.7 (H) 04/28/2022      The ASCVD Risk score (Arnett DK, et al., 2019) failed to calculate for the following reasons:   The patient has a prior MI or stroke diagnosis    Assessment & Plan:   Problem List Items Addressed This Visit       Cardiovascular and Mediastinum   Hypertension    BP well-controlled on amlodipine 10 mg, losartan 100 mg, Lasix 40 mg, and metoprolol 25 mg daily.  Will believe amlodipine 5 mg was started at his last visit but he seems to be taking 1 tablet a day.  No significant increase in leg swelling with addition of amlodipine and weight seems stable compared to last visit.  Given his blood pressure is well-controlled we will continue him  on current medications.        Endocrine   Type 2 diabetes mellitus with hyperglycemia, without long-term current use of insulin (HCC)    Currently on metformin daily.  He did not pick up Ozempic as he was not sure if this was covered by insurance.  Reports fasting CBGs around 1 25-1 50 likely.  Quite elevated glucose levels in the setting of drinking liquor and beer frequently.  He has stopped drinking any alcohol lately and states his sugars appear to be doing well at home.  Advise he pick up his Ozempic and call us if this is not affordable.    increase metformin to twice daily as discussed at last visit Start Ozempic 0.25 mg weekly  Has appoint with ophthalmology in June Follow-up 2 months         Other   Tobacco use disorder    Patient smoking approximately 1 cigar a day no longer smoking cigarettes.  Appears he was started on Chantix several months ago.  He states he was not able to tolerate this due to vivid dreams.  History is of seizures and cannot use bupropion.  He is still interested in smoking cessation and is trying to cut down.  Did try patches before with some effect.  Will restart him on 14 mg nicotine patches and nicotine gum and follow-up in 2 months.      Alcohol abuse    Reports he is no longer drinking alcohol.  Since visit about 1 month ago.  Denied any significant withdrawal symptoms, seizures, hallucinations, delirium.  Advised continued alcohol cessation.      Healthcare maintenance    Has not scheduled with GI for colonoscopy given him office contact information.      Exertional dyspnea - Primary    Patient continues to have dyspnea with exertion cannot walk or stand for long.'s due to this.  Has had previous cardiac workup after NSTEMI on 10/2021. Status post PCI and DAPT with Plavix and aspirin.  No longer having chest pain lipid profile much improved.  He is wearing his CPAP nightly.  BP is better controlled currently.  However he reports he is still using  albuterol once for exertional dyspnea.  He denies any wheezing but does cough frequently.  He denies orthopnea or lower extremity swelling.  Weight is stable and does not suggest heart failure exacerbation patient is contributing. He endorses a smoking history of 1-1/2 packs a day for 20 years and  currently smoking cigars daily.  Despite management of his cardiac conditions and OSA does not seem to have much improvement in his dyspnea given his smoking history we will consider obstructive lung pathology may be contributing to his symptoms.  Will check PFTs to further evaluate.      Relevant Orders   Pulmonary function test   Other Visit Diagnoses     Nicotine dependence, cigarettes, uncomplicated       Relevant Medications   nicotine polacrilex (NICORETTE) 2 MG gum   nicotine (NICODERM CQ - DOSED IN MG/24 HOURS) 14 mg/24hr patch       Return in about 2 months (around 07/19/2022).    Iona Beard, MD

## 2022-05-25 ENCOUNTER — Other Ambulatory Visit: Payer: Self-pay | Admitting: Student

## 2022-05-25 DIAGNOSIS — E119 Type 2 diabetes mellitus without complications: Secondary | ICD-10-CM

## 2022-05-28 ENCOUNTER — Telehealth: Payer: Self-pay

## 2022-05-28 ENCOUNTER — Encounter: Payer: Self-pay | Admitting: Gastroenterology

## 2022-05-28 NOTE — Telephone Encounter (Signed)
Pt states insurance will not covered for his insulin pen. Requesting to speak with a nurse. Please call pt back.

## 2022-05-28 NOTE — Telephone Encounter (Signed)
Returned call to patient. No answer. No VM set up.

## 2022-07-07 ENCOUNTER — Other Ambulatory Visit: Payer: Self-pay | Admitting: Internal Medicine

## 2022-07-18 ENCOUNTER — Encounter: Payer: BLUE CROSS/BLUE SHIELD | Admitting: Student

## 2022-07-27 ENCOUNTER — Other Ambulatory Visit: Payer: Self-pay | Admitting: Cardiology

## 2022-08-05 ENCOUNTER — Ambulatory Visit: Payer: BLUE CROSS/BLUE SHIELD | Admitting: Gastroenterology

## 2022-08-06 ENCOUNTER — Encounter: Payer: Self-pay | Admitting: *Deleted

## 2022-08-13 ENCOUNTER — Encounter: Payer: BLUE CROSS/BLUE SHIELD | Admitting: Student

## 2022-08-13 ENCOUNTER — Encounter: Payer: Medicaid Other | Admitting: Cardiology

## 2022-08-13 NOTE — Progress Notes (Deleted)
Follow up visit  Subjective:   Dillon Wang, male    DOB: 16-Apr-1965, 57 y.o.   MRN: [id number]   HPI  Chief Complaint  Patient presents with   Coronary Artery Disease   Follow-up    57 y.o. African American male  with hypertension, hyperlipidemia, type 2 DM, obesity, OSA, CAD, h/o polysubstance abuse, h/o prostate cancer, h/o seizures  Patient denies any chest pain, but has exertional dyspnea with minimal exertion.  He has gained several pounds in the last few months.  He has had orthopnea, but also attributes this to not having his CPAP available.  He also has eczema.  Reviewed recent stress results with the patient.  Of note, patient did indeed have chest pain when he had his ACS, but has not had any chest pain recently.    Current Outpatient Medications:    albuterol (VENTOLIN HFA) 108 (90 Base) MCG/ACT inhaler, Inhale 2 puffs into the lungs every 4 (four) hours as needed for wheezing or shortness of breath., Disp: 18 g, Rfl: 2   amLODipine (NORVASC) 10 MG tablet, Take 1 tablet (10 mg total) by mouth daily., Disp: 30 tablet, Rfl: 5   aspirin 81 MG chewable tablet, Chew 1 tablet (81 mg total) by mouth daily., Disp: 90 tablet, Rfl: 3   budesonide-formoterol (SYMBICORT) 160-4.5 MCG/ACT inhaler, Inhale 2 puffs into the lungs 2 (two) times daily., Disp: 10.2 g, Rfl: 3   clopidogrel (PLAVIX) 75 MG tablet, Take 1 tablet (75 mg total) by mouth daily., Disp: 30 tablet, Rfl: 11   Evolocumab (REPATHA SURECLICK) 140 MG/ML SOAJ, Inject 140 mg into the skin every 14 (fourteen) days., Disp: 6 mL, Rfl: 5   furosemide (LASIX) 40 MG tablet, TAKE 1 TABLET BY MOUTH TWICE A DAY, Disp: 60 tablet, Rfl: 6   levETIRAcetam (KEPPRA) 750 MG tablet, Take 1 tablet (750 mg total) by mouth 2 (two) times daily., Disp: 60 tablet, Rfl: 5   losartan (COZAAR) 100 MG tablet, TAKE 1 TABLET BY MOUTH (100MG  TOTAL) DAILY, Disp: 90 tablet, Rfl: 3   metFORMIN (GLUCOPHAGE-XR) 500 MG 24 hr tablet, TAKE 2 TABLETS (1000MG   TOTAL) BY MOUTH DAILY WITH BREAKFAST, Disp: 60 tablet, Rfl: 2   metoprolol succinate (TOPROL XL) 25 MG 24 hr tablet, Take 1 tablet (25 mg total) by mouth daily., Disp: 30 tablet, Rfl: 9   Misc. Devices MISC, Auto bipap with Medium size Fisher[employer]Paykel Full Face Mask Simplus mask/ heated humidification. Autopap settings 15-20 cm H2O., Disp: 1 each, Rfl: 0   nicotine (NICODERM CQ - DOSED IN MG/24 HOURS) 14 mg/24hr patch, Place 1 patch (14 mg total) onto the skin daily., Disp: 60 patch, Rfl: 2   nicotine polacrilex (NICORETTE) 2 MG gum, Take 1 each (2 mg total) by mouth as needed for smoking cessation., Disp: 60 tablet, Rfl: 3   nitroGLYCERIN (NITROSTAT) 0.4 MG SL tablet, Place 1 tablet (0.4 mg total) under the tongue every 5 (five) minutes as needed for chest pain., Disp: 30 tablet, Rfl: 12   rosuvastatin (CRESTOR) 40 MG tablet, Take 1 tablet (40 mg total) by mouth daily., Disp: 30 tablet, Rfl: 11   Cardiovascular & other pertient studies:  Reviewed external labs and tests, independently interpreted  EKG 04/14/2022: Sinus rhythm 89 bpm Nonspecific T-abnormality  Lexiscan/modified Bruce Tetrofosmin stress test 03/26/2022: Lexiscan/modified Bruce nuclear stress test performed using 1-day protocol. Patient walked for 3.9 METS, reached 84% MPHR. Resting hypertension 170/100 mmHg, peak BP 220/94 mmHg. No chest pain reported. Stress EKG is non-diagnostic,  as this is pharmacological stress test. In addition, stress EKG at 84% MPHR showed sinus tachycardia, possible old anteroseptal infarct, poor R wave progression, no significant ST-T abnormality.  SPECT images show small sized, medium intensity, reversible perfusion defect in apical anterolateral myocardium. In addition, there mildly decreased tracer uptake at rest and stress, likely due to diaphragmatic attenuation. Normal wall motion and thickening. Stress LVEF 77%. Low risk study.    Echocardiogram 10/28/2021:  1. Left ventricular ejection fraction, by  estimation, is 55 to 60%. The  left ventricle has normal function. The left ventricle has no regional  wall motion abnormalities. Left ventricular diastolic parameters are  indeterminate.   2. Right ventricular systolic function is normal. The right ventricular  size is normal.   3. Left atrial size was mildly dilated.   4. The mitral valve is normal in structure. Mild mitral valve  regurgitation. No evidence of mitral stenosis.   5. The aortic valve is normal in structure. Aortic valve regurgitation is  not visualized. No aortic stenosis is present.    Coronary intervention 10/29/2021: LM: Normal LAD: Ostial to prox diffuse 40% disease Lcx: Large vessel        Mid OM1 (Non-culprit) tandem 60%-80% stenoses RCA: Prox 95% stenosis (Culprit)         Disal focal 60% disease   Successful percutaneous coronary intervention prox RCA and mid OM1        PTCA and stent placement 3.5 X 28 mm Synergy drug-eluting stent-->post dilatation with 4.35mm [place] balloon up to 18 atm        PTCA and stent placement 4.0 X 20 mm Synergy drug-eluting stent-->post dilatation with 4.82mm [place] balloon up to 18 atm   0% residual stenoses in prox RCA and mid OM1 Distal embolization in small caliber distal OM1   Recommend IV nitroglycerin 10 mics an hour overnight, and IV Aggrastat for 6 hours. Recommend DAPT with aspirin and Brilinta for 1 year    Recent labs: 04/28/2022: Glucose 423, BUN/Cr 16/1.21. EGFR 71. Na/K 135/4.4.  CBC NA HbA1C 10.7% Chol 110, TG 379, HDL 24, LDL cal 31 TSH NA  02/19/2022: Glucose 151, BUN/Cr 15/1.1. EGFR 79. Na/K 141/4.3.  Chol 159, TG 111, HDL 51, LDL 88 Lipoprotein (a) 195  01/02/2022: Glucose 132, BUN/Cr 14/1.1. EGFR 79. Na/K 137/4.3.  HbA1C 6/8%  10/29/2021: Glucose 145, BUN/Cr 10/1.19. EGFR >60. Na/K 133/3.2.  H/H 17/51. MCV 90. Platelets 189 HbA1C 6.6% Chol 193, TG 95, HDL 55, LDL 119 Lipoprotein (a) 220 Trop HS peak 4200    Review of Systems  Cardiovascular:   Positive for dyspnea on exertion and leg swelling. Negative for chest pain, palpitations and syncope.         There were no vitals filed for this visit.   There is no height or weight on file to calculate BMI. There were no vitals filed for this visit.    Objective:   Physical Exam Vitals and nursing note reviewed.  Constitutional:      General: He is not in acute distress. Neck:     Vascular: No JVD.  Cardiovascular:     Rate and Rhythm: Normal rate and regular rhythm.     Heart sounds: Normal heart sounds. No murmur heard. Pulmonary:     Effort: Pulmonary effort is normal.     Breath sounds: Normal breath sounds. No wheezing or rales.  Musculoskeletal:     Right lower leg: Edema (1+) present.     Left lower leg: Edema (  1+) present.             Visit diagnoses: No diagnosis found.    No orders of the defined types were placed in this encounter.   No orders of the defined types were placed in this encounter.    Assessment & Recommendations:   57 y.o. African American male  with hypertension, hyperlipidemia, type 2 DM, obesity, OSA, CAD, h/o polysubstance abuse, h/o prostate cancer, h/o seizures  Exertional dyspnea: Likely multifactorial.  Normal EF on echocardiogram and recent stress test, could have component of HFpEF. Hypertension obesity, as well as inadequate treatment of OSA are certainly contributing. Increase Lasix to 40 mg twice daily. Recommend low-salt diet regular weight check.  CAD: NSTEMI 10/2021 Successful PCI to proximal circumflex and mid OM1. Distal embolization of small caliber distal OM1, periprocedural infarct with peak HS tro 4200 Recommend DAPT with aspirin and Plavix for 1 year (could not afford Brilinta) till 10/2022. In addition to statin, added Repatha. Continue metoprolol tartrate, amlodipine. Repeat lipid panel 3 months  Mild apical anterolateral ischemia without any symptoms of chest pain.  Continue medical management at  this time.  No plans for immediate coronary angiography or heart catheterization.  F/u in 3 months     Elder Negus, MD Pager: [phone] Office: [phone]

## 2022-08-18 ENCOUNTER — Other Ambulatory Visit: Payer: Self-pay | Admitting: Internal Medicine

## 2022-08-18 DIAGNOSIS — R569 Unspecified convulsions: Secondary | ICD-10-CM

## 2022-08-19 ENCOUNTER — Other Ambulatory Visit: Payer: Self-pay | Admitting: Internal Medicine

## 2022-08-19 DIAGNOSIS — E119 Type 2 diabetes mellitus without complications: Secondary | ICD-10-CM

## 2022-09-08 ENCOUNTER — Ambulatory Visit: Payer: Commercial Managed Care - HMO | Admitting: Internal Medicine

## 2022-09-08 ENCOUNTER — Telehealth: Payer: Self-pay | Admitting: *Deleted

## 2022-09-08 ENCOUNTER — Other Ambulatory Visit: Payer: Self-pay | Admitting: Internal Medicine

## 2022-09-08 VITALS — BP 163/95 | HR 91 | Temp 98.2°F | Ht 69.0 in | Wt 263.0 lb

## 2022-09-08 DIAGNOSIS — I5032 Chronic diastolic (congestive) heart failure: Secondary | ICD-10-CM

## 2022-09-08 DIAGNOSIS — I110 Hypertensive heart disease with heart failure: Secondary | ICD-10-CM | POA: Diagnosis not present

## 2022-09-08 DIAGNOSIS — F17210 Nicotine dependence, cigarettes, uncomplicated: Secondary | ICD-10-CM

## 2022-09-08 DIAGNOSIS — F17200 Nicotine dependence, unspecified, uncomplicated: Secondary | ICD-10-CM

## 2022-09-08 DIAGNOSIS — E1165 Type 2 diabetes mellitus with hyperglycemia: Secondary | ICD-10-CM

## 2022-09-08 DIAGNOSIS — I252 Old myocardial infarction: Secondary | ICD-10-CM

## 2022-09-08 DIAGNOSIS — Z794 Long term (current) use of insulin: Secondary | ICD-10-CM

## 2022-09-08 DIAGNOSIS — I10 Essential (primary) hypertension: Secondary | ICD-10-CM

## 2022-09-08 DIAGNOSIS — Z7984 Long term (current) use of oral hypoglycemic drugs: Secondary | ICD-10-CM

## 2022-09-08 LAB — POCT GLYCOSYLATED HEMOGLOBIN (HGB A1C): Hemoglobin A1C: 13.7 % — AB (ref 4.0–5.6)

## 2022-09-08 LAB — GLUCOSE, CAPILLARY: Glucose-Capillary: 307 mg/dL — ABNORMAL HIGH (ref 70–99)

## 2022-09-08 MED ORDER — BLOOD GLUCOSE MONITOR KIT
1.00 | PACK | 0 refills | Status: AC
Start: 2022-09-08 — End: ?

## 2022-09-08 MED ORDER — BLOOD GLUCOSE MONITOR KIT
1.00 | PACK | 0 refills | Status: DC
Start: 2022-09-08 — End: 2022-09-08

## 2022-09-08 MED ORDER — BLOOD GLUCOSE MONITOR KIT
PACK | 0 refills | Status: DC
Start: 2022-09-08 — End: 2022-09-08

## 2022-09-08 MED ORDER — INSULIN GLARGINE 100 UNIT/ML SOLOSTAR PEN
15.0000 [IU] | PEN_INJECTOR | Freq: Every day | SUBCUTANEOUS | 0 refills | Status: DC
Start: 2022-09-08 — End: 2022-09-09

## 2022-09-08 MED ORDER — PEN NEEDLES 32G X 4 MM MISC
1.0000 | Freq: Every day | 3 refills | Status: DC
Start: 2022-09-08 — End: 2023-03-30

## 2022-09-08 MED ORDER — LOSARTAN POTASSIUM-HCTZ 100-25 MG PO TABS
1.0000 | ORAL_TABLET | Freq: Every day | ORAL | 3 refills | Status: DC
Start: 2022-09-08 — End: 2022-09-22

## 2022-09-08 MED ORDER — TRULICITY 0.75 MG/0.5ML ~~LOC~~ SOAJ
0.7500 mg | SUBCUTANEOUS | 3 refills | Status: DC
Start: 2022-09-08 — End: 2022-09-22

## 2022-09-08 NOTE — Progress Notes (Unsigned)
Subjective:  CC: medication refill  HPI:  [name] is a 57 y.o. male with a past medical history stated below and presents today for follow-up on diabetes, hypertension. Please see problem based assessment and plan for additional details.  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 18 g 2   amLODipine (NORVASC) 10 MG tablet Take 1 tablet (10 mg total) by mouth daily. 30 tablet 5   aspirin 81 MG chewable tablet Chew 1 tablet (81 mg total) by mouth daily. 90 tablet 3   budesonide-formoterol (SYMBICORT) 160-4.5 MCG/ACT inhaler Inhale 2 puffs into the lungs 2 (two) times daily. 10.2 g 3   clopidogrel (PLAVIX) 75 MG tablet Take 1 tablet (75 mg total) by mouth daily. 30 tablet 11   Evolocumab (REPATHA SURECLICK) 140 MG/ML SOAJ Inject 140 mg into the skin every 14 (fourteen) days. 6 mL 5   furosemide (LASIX) 40 MG tablet TAKE 1 TABLET BY MOUTH TWICE A DAY 60 tablet 6   levETIRAcetam (KEPPRA) 750 MG tablet TAKE 1 TABLET (750MG  TOTAL) BY MOUTH TWICE A DAY 180 tablet 3   metFORMIN (GLUCOPHAGE-XR) 500 MG 24 hr tablet TAKE 2 TABLETS (1000MG  TOTAL) BY MOUTH DAILY WITH BREAKFAST 60 tablet 2   metoprolol succinate (TOPROL  XL) 25 MG 24 hr tablet Take 1 tablet (25 mg total) by mouth daily. 30 tablet 9   Misc. Devices MISC Auto bipap with Medium size Fisher[employer]Paykel Full Face Mask Simplus mask/ heated humidification. Autopap settings 15-20 cm H2O. 1 each 0   nicotine (NICODERM CQ - DOSED IN MG/24 HOURS) 14 mg/24hr patch Place 1 patch (14 mg total) onto the skin daily. 60 patch 2   nicotine polacrilex (NICORETTE) 2 MG gum Take 1 each (2 mg total) by mouth as needed for smoking cessation. 60 tablet 3   nitroGLYCERIN (NITROSTAT) 0.4 MG SL tablet Place 1 tablet (0.4 mg total) under the tongue every 5 (five) minutes as needed for chest pain. 30 tablet 12   rosuvastatin (CRESTOR) 40 MG tablet Take 1 tablet (40 mg total) by mouth daily. 30 tablet 11   No current facility-administered medications on file prior to visit.    Family History  Problem Relation Age of Onset   Hypertension Mother    Seizures Father    Seizures Brother    Diabetes Maternal Aunt    Colon cancer Neg Hx    Esophageal cancer Neg Hx    Rectal cancer Neg Hx    Stomach cancer Neg Hx     Social History   Socioeconomic History   Marital status: Single    Spouse name: Not on file   Number of children: 0  Years of education: Not on file   Highest education level: Not on file  Occupational History   Not on file  Tobacco Use   Smoking status: Some Days    Packs/day: .5    Types: Cigarettes   Smokeless tobacco: Never   Tobacco comments:    2 cigarettes a day  Vaping Use   Vaping Use: Never used  Substance and Sexual Activity   Alcohol use: Yes    Comment: beer every couple of days   Drug use: Not Currently    Types: Cocaine    Comment: sober 6 months   Sexual activity: Not on file  Other Topics Concern   Not on file  Social History Narrative   Not on file   Social Determinants of Health   Financial Resource Strain: High Risk (12/30/2021)   Overall Financial Resource Strain (CARDIA)    Difficulty of Paying Living Expenses: Very  hard  Food Insecurity: No Food Insecurity (02/19/2022)   Hunger Vital Sign    Worried About Running Out of Food in the Last Year: Never true    Ran Out of Food in the Last Year: Never true  Transportation Needs: No Transportation Needs (12/30/2021)   PRAPARE - Administrator, Civil Service (Medical): No    Lack of Transportation (Non-Medical): No  Physical Activity: Not on file  Stress: Not on file  Social Connections: Moderately Isolated (04/28/2022)   Social Connection and Isolation Panel [NHANES]    Frequency of Communication with Friends and Family: More than three times a week    Frequency of Social Gatherings with Friends and Family: Three times a week    Attends Religious Services: More than 4 times per year    Active Member of Clubs or Organizations: No    Attends Banker Meetings: Never    Marital Status: Never married  Intimate Partner Violence: Not At Risk (02/19/2022)   Humiliation, Afraid, Rape, and Kick questionnaire    Fear of Current or Ex-Partner: No    Emotionally Abused: No    Physically Abused: No    Sexually Abused: No    Review of Systems: ROS negative except for what is noted on the assessment and plan.  Objective:   Vitals:   09/08/22 1317 09/08/22 1346  BP: (!) 158/90 (!) 163/95  Pulse: 96 91  Temp: 98.2 F (36.8 C)   TempSrc: Oral   SpO2: 96%   Weight: 263 lb (119.3 kg)   Height: 5\' 9"  (1.753 m)     Physical Exam: Constitutional: well-appearing  Cardiovascular: regular rate and rhythm, no m/r/g, no JVD Pulmonary/Chest: normal work of breathing on room air, lungs clear to auscultation bilaterally Abdominal: soft, non-tender, non-distended MSK: normal bulk and tone, no lower extremity edema Skin: warm and dry   Assessment & Plan:  Type 2 diabetes mellitus with hyperglycemia, without long-term current use of insulin (HCC) Lab Results  Component Value Date   HGBA1C 10.7 (H) 04/28/2022  A1c increased from 10.7 to  13.7. He reports adherence to metformin 1000 mg bid. He was not able to pick up ozempic as it cost $150 per month with insurance.  His a1c has increased from being well controlled in 2023 to 13.7 this year. He is unsure of case, but does comment that he feels like he eats more fried foods now. Denies polyuria and polydipsia.  P: Continue metformin 1000mg  BID Trulicity 0.75 mg weekly sent  Start glargine 15 units nightly Glucometer sent in  with instructions to check fasting blood sugar. He has been on insulin in the past and understands how to use pens. Main goal of insulin is to get blood sugars under control quickly. Long term I think he would benefit more from GLP-1 and sglt2. Will try sending in different GLP-1. Would consider additional of SGLT2 at follow-up.  Hypertension Blood pressure elevated Initial BP at 158/90 and increased to 163/95 when repeated. He reports adherence with losartan 100 mg and amlodipine 10 mg. He has known sleep apnea and uses CPAP nightly. No prior evaluation for hyperaldosteronism. P: Add losartan-hydrochlorothiazide 100-25 mg Continue amlodipine 10 mg and metoprolol succinate 25 mg F/u in 2 weeks, could consider further evaluation for secondary causes of hypertension at follow-up  Tobacco use disorder He has cut down on smoking to about 1 cigarette daily using nicotine gum.  History of non-ST elevation myocardial infarction (NSTEMI) He remains on DAPT since PCI 8/23. He missed follow-up with cardiology last month and will call to reschedule. He will complete 1 year of DAPT in August.  (HFpEF) heart failure with preserved ejection fraction (HCC) Last echo 8/23 with EF 55-60%. His weight is down about 10 lbs from last visit. He reports shortness of breath is stable from baseline. He appears euvolemic on exam. P: Continue furosemide 40 mg BID Consider addition of sglt2 at follow-up    Patient discussed with Dr. Wille Celeste Delvis Kau, D.O. [facility]  Internal Medicine  PGY-3 Pager: [phone]  Phone: [phone] Date 09/09/2022  Time 7:04 AM

## 2022-09-08 NOTE — Patient Instructions (Addendum)
Thank you, Dillon Wang for allowing [place] to provide your care today.   Diabetes Your A1c was elevated at 13. This puts at higher risk for heart attacks, stroke, kidney disease, and vision damage over time. I am starting long acting insulin called Lantus. Inject 15 units at night prior to going to bed. I sent in another glucometer in case you did not have all parts for glucometer. Check your blood sugar every morning before eating breakfast. The goal is for this number to be <140. I am sending in a different version of ozempic to see if your insurance covers more. This is a good medication to help lower risk of heart disease.  Blood pressure I have sent in a medication called Hyzaar. This is a combination pill with losartan and hydrochlorothiazide. Continue taking amlodipine and metoprolol. Blood pressure goal is <130/80.  I have ordered the following labs for you:  Lab Orders         Glucose, capillary         POC Hbg A1C      I have ordered the following medication/changed the following medications:   Stop the following medications: Medications Discontinued During This Encounter  Medication Reason   losartan (COZAAR) 100 MG tablet    blood glucose meter kit and supplies KIT Reorder     Start the following medications: Meds ordered this encounter  Medications   insulin glargine (LANTUS) 100 UNIT/ML Solostar Pen    Sig: Inject 15 Units into the skin at bedtime.    Dispense:  15 mL    Refill:  0   DISCONTD: blood glucose meter kit and supplies KIT    Sig: Dispense based on patient and insurance preference. Check blood sugar daily prior to eating breakfast.    Dispense:  1 each    Refill:  0    Order Specific Question:   Number of strips    Answer:   100    Order Specific Question:   Number of lancets    Answer:   100   Insulin Pen Needle (PEN NEEDLES) 32G X 4 MM MISC    Sig: 1 Needle by Does not apply route at bedtime.    Dispense:  100 each    Refill:  3    losartan-hydrochlorothiazide (HYZAAR) 100-25 MG tablet    Sig: Take 1 tablet by mouth daily.    Dispense:  90 tablet    Refill:  3   Dulaglutide (TRULICITY) 0.75 MG/0.5ML SOPN    Sig: Inject 0.75 mg into the skin once a week.    Dispense:  2 mL    Refill:  3   blood glucose meter kit and supplies KIT    Sig: Inject 1 each into the skin as directed. Dispense based on patient and insurance preference. Check blood sugar daily prior to eating breakfast.    Dispense:  1 each    Refill:  0    Order Specific Question:   Number of strips    Answer:   100    Order Specific Question:   Number of lancets    Answer:   100     Follow up:  2 weeks    We look forward to seeing you next time. Please call our clinic at [phone] if you have any questions or concerns. The best time to call is Monday-Friday from 9am-4pm, but there is someone available 24/7. If after hours or the weekend, call the main hospital number and ask for  the Internal Medicine Resident On-Call. If you need medication refills, please notify your pharmacy one week in advance and they will send [place] a request.   Thank you for trusting me with your care. Wishing you the best!   Rudene Christians, DO [facility]

## 2022-09-08 NOTE — Telephone Encounter (Signed)
Pt stated he was seen this am and he was started on insulin. And the doctor (Dr Sloan Leiter) told him to call back if it's not covered by his insurance. Lantus is not covered by his insurance. I called [facility] - stated Lantus is not covered but Mariella Saa is. Thanks

## 2022-09-09 DIAGNOSIS — I5030 Unspecified diastolic (congestive) heart failure: Secondary | ICD-10-CM | POA: Insufficient documentation

## 2022-09-09 MED ORDER — BASAGLAR KWIKPEN 100 UNIT/ML ~~LOC~~ SOPN
15.0000 [IU] | PEN_INJECTOR | Freq: Every day | SUBCUTANEOUS | 0 refills | Status: DC
Start: 2022-09-09 — End: 2022-09-26

## 2022-09-09 NOTE — Assessment & Plan Note (Signed)
He remains on DAPT since PCI 8/23. He missed follow-up with cardiology last month and will call to reschedule. He will complete 1 year of DAPT in August.

## 2022-09-09 NOTE — Assessment & Plan Note (Signed)
Blood pressure elevated Initial BP at 158/90 and increased to 163/95 when repeated. He reports adherence with losartan 100 mg and amlodipine 10 mg. He has known sleep apnea and uses CPAP nightly. No prior evaluation for hyperaldosteronism. P: Add losartan-hydrochlorothiazide 100-25 mg Continue amlodipine 10 mg and metoprolol succinate 25 mg F/u in 2 weeks, could consider further evaluation for secondary causes of hypertension at follow-up

## 2022-09-09 NOTE — Telephone Encounter (Signed)
I called pt who stated he got the new rx for Basaglar.

## 2022-09-09 NOTE — Assessment & Plan Note (Signed)
He has cut down on smoking to about 1 cigarette daily using nicotine gum.

## 2022-09-09 NOTE — Telephone Encounter (Signed)
Thank you Glenda, basaglar sent to [facility]

## 2022-09-09 NOTE — Assessment & Plan Note (Signed)
Last echo 8/23 with EF 55-60%. His weight is down about 10 lbs from last visit. He reports shortness of breath is stable from baseline. He appears euvolemic on exam. P: Continue furosemide 40 mg BID Consider addition of sglt2 at follow-up

## 2022-09-09 NOTE — Assessment & Plan Note (Signed)
Lab Results  Component Value Date   HGBA1C 10.7 (H) 04/28/2022  A1c increased from 10.7 to 13.7. He reports adherence to metformin 1000 mg bid. He was not able to pick up ozempic as it cost $150 per month with insurance.  His a1c has increased from being well controlled in 2023 to 13.7 this year. He is unsure of case, but does comment that he feels like he eats more fried foods now. Denies polyuria and polydipsia.  P: Continue metformin 1000mg  BID Trulicity 0.75 mg weekly sent  Start glargine 15 units nightly Glucometer sent in with instructions to check fasting blood sugar. He has been on insulin in the past and understands how to use pens. Main goal of insulin is to get blood sugars under control quickly. Long term I think he would benefit more from GLP-1 and sglt2. Will try sending in different GLP-1. Would consider additional of SGLT2 at follow-up.

## 2022-09-12 NOTE — Progress Notes (Signed)
Internal Medicine Clinic Attending  Case discussed with the resident at the time of the visit.  We reviewed the resident's history and exam and pertinent patient test results.  I agree with the assessment, diagnosis, and plan of care documented in the resident's note.  

## 2022-09-22 ENCOUNTER — Encounter: Payer: Self-pay | Admitting: Internal Medicine

## 2022-09-22 ENCOUNTER — Ambulatory Visit (INDEPENDENT_AMBULATORY_CARE_PROVIDER_SITE_OTHER): Payer: BLUE CROSS/BLUE SHIELD | Admitting: Internal Medicine

## 2022-09-22 ENCOUNTER — Other Ambulatory Visit: Payer: Self-pay

## 2022-09-22 ENCOUNTER — Other Ambulatory Visit: Payer: Self-pay | Admitting: Internal Medicine

## 2022-09-22 VITALS — BP 109/71 | HR 82 | Temp 98.5°F | Resp 28 | Ht 69.0 in | Wt 258.1 lb

## 2022-09-22 DIAGNOSIS — E1165 Type 2 diabetes mellitus with hyperglycemia: Secondary | ICD-10-CM

## 2022-09-22 DIAGNOSIS — I110 Hypertensive heart disease with heart failure: Secondary | ICD-10-CM | POA: Diagnosis not present

## 2022-09-22 DIAGNOSIS — F17210 Nicotine dependence, cigarettes, uncomplicated: Secondary | ICD-10-CM | POA: Diagnosis not present

## 2022-09-22 DIAGNOSIS — Z794 Long term (current) use of insulin: Secondary | ICD-10-CM

## 2022-09-22 DIAGNOSIS — Z7985 Long-term (current) use of injectable non-insulin antidiabetic drugs: Secondary | ICD-10-CM

## 2022-09-22 DIAGNOSIS — Z7984 Long term (current) use of oral hypoglycemic drugs: Secondary | ICD-10-CM

## 2022-09-22 DIAGNOSIS — I5032 Chronic diastolic (congestive) heart failure: Secondary | ICD-10-CM

## 2022-09-22 DIAGNOSIS — I10 Essential (primary) hypertension: Secondary | ICD-10-CM

## 2022-09-22 MED ORDER — LOSARTAN POTASSIUM 100 MG PO TABS: 100.0000 mg | ORAL_TABLET | Freq: Every day | ORAL | 11 refills | Status: DC

## 2022-09-22 MED ORDER — SEMAGLUTIDE(0.25 OR 0.5MG/DOS) 2 MG/3ML ~~LOC~~ SOPN: 0.2500 mg | PEN_INJECTOR | SUBCUTANEOUS | 0 refills | Status: DC

## 2022-09-22 NOTE — Assessment & Plan Note (Signed)
Lab Results  Component Value Date   HGBA1C 13.7 (A) 09/08/2022  A1c has been increasing over last year, he feels like he didn't have access to food at that time but now lives with sister and eats regularly. Started insulin and trulicity at last office visit but he was not able to get GLP-1 due to cost. He lost about 5 lbs in last 2 weeks. He does eat a lot of fried foods, his sister cooks mostly at home. His glucometer readings show elevated blood sugars with AM fasting in 200-300s.  Medications: Glargine 15 units increased to 18 units Metformin 1000 bid Ozempic sent, will need PA Consider adding  SGLT2 at follow-up

## 2022-09-22 NOTE — Assessment & Plan Note (Signed)
Appears euvolemic on exam. Endorses adherence with lasix and requests potassium supplement refill. He has not taken K supplement in > 2 weeks since running out. P: Repeat BMP, if K wnl would not send in additional supplementation

## 2022-09-22 NOTE — Assessment & Plan Note (Signed)
Hydrochlorothiazide was added to losartan and amlodipine since last office visit. He feels dizzy at times when he stands up since starting new medication. His BP is well controlled at 109/71. Current medications include: Losartan-hydrochlorothiazide 100-25 mg and Amlodipine 10 mg. Orthostatics were negative. P: Stop combo pill Continue losartan and amlodipine Repeat BMP

## 2022-09-22 NOTE — Patient Instructions (Addendum)
Thank you, Mr.Bardia Scarpelli for allowing [place] to provide your care today.   Diabetes: Increase Basaglar from 15 units nightly to 18 units nightly. I have sent in a different version of trulicity to see if your insurance covers this, it is called ozempic.  I have put referral in for dietician. I think it would be helpful if you were able to talk with her.  Blood pressure: If you are feeling dizzy on new blood pressure medications we can go back to what you were on before. Please stop taking combo pill and restart losartan 100 mg with amlodipine 10 mg.  I am rechecking you blood work to see if you still need to take potassium.  I have ordered the following medication/changed the following medications:   Stop the following medications: Medications Discontinued During This Encounter  Medication Reason   Dulaglutide (TRULICITY) 0.75 MG/0.5ML SOPN    losartan-hydrochlorothiazide (HYZAAR) 100-25 MG tablet      Start the following medications: Meds ordered this encounter  Medications   Semaglutide,0.25 or 0.5MG /DOS, 2 MG/3ML SOPN    Sig: Inject 0.25 mg into the skin once a week.    Dispense:  3 mL    Refill:  0   losartan (COZAAR) 100 MG tablet    Sig: Take 1 tablet (100 mg total) by mouth daily.    Dispense:  30 tablet    Refill:  11     Follow up:  1 month    We look forward to seeing you next time. Please call our clinic at [phone] if you have any questions or concerns. The best time to call is Monday-Friday from 9am-4pm, but there is someone available 24/7. If after hours or the weekend, call the main hospital number and ask for the Internal Medicine Resident On-Call. If you need medication refills, please notify your pharmacy one week in advance and they will send [place] a request.   Thank you for trusting me with your care. Wishing you the best!   Rudene Christians, DO [facility]

## 2022-09-22 NOTE — Progress Notes (Signed)
Subjective:  CC: dizziness  HPI:  Dillon Wang is a 57 y.o. male with a past medical history stated below and presents today for dizziness since additional of blood pressure medication 2 weeks ago.  He was also started on insulin for uncontrolled diabetes. Please see problem based assessment and plan for additional details.  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 18 g 2   amLODipine (NORVASC) 10 MG tablet Take 1 tablet (10 mg total) by mouth daily. 30 tablet 5   aspirin 81 MG chewable tablet Chew 1 tablet (81 mg total) by mouth daily. 90 tablet 3   blood glucose meter kit and supplies KIT Inject 1 each into the skin as directed. Dispense based on patient and insurance preference. Check blood sugar daily prior to eating breakfast. 1 each 0   budesonide-formoterol (SYMBICORT) 160-4.5 MCG/ACT inhaler Inhale 2 puffs into the lungs 2 (two) times daily. 10.2 g 3   clopidogrel (PLAVIX) 75 MG tablet Take 1 tablet (75 mg total) by mouth daily. 30 tablet 11   Evolocumab (REPATHA SURECLICK) 140 MG/ML SOAJ Inject 140 mg into the skin every 14 (fourteen) days. 6 mL 5   furosemide (LASIX) 40 MG tablet TAKE 1 TABLET BY MOUTH  TWICE A DAY 60 tablet 6   Insulin Glargine (BASAGLAR KWIKPEN) 100 UNIT/ML Inject 15 Units into the skin at bedtime. 15 mL 0   Insulin Pen Needle (PEN NEEDLES) 32G X 4 MM MISC 1 Needle by Does not apply route at bedtime. 100 each 3   levETIRAcetam (KEPPRA) 750 MG tablet TAKE 1 TABLET (750MG  TOTAL) BY MOUTH TWICE A DAY 180 tablet 3   metFORMIN (GLUCOPHAGE-XR) 500 MG 24 hr tablet TAKE 2 TABLETS (1000MG  TOTAL) BY MOUTH DAILY WITH BREAKFAST 60 tablet 2   metoprolol succinate (TOPROL XL) 25 MG 24 hr tablet Take 1 tablet (25 mg total) by mouth daily. 30 tablet 9   Misc. Devices MISC Auto bipap with Medium size [employer] Full Face Mask Simplus mask/ heated humidification. Autopap settings 15-20 cm H2O. 1 each 0   nicotine (NICODERM CQ - DOSED IN MG/24 HOURS) 14 mg/24hr patch Place 1 patch (14 mg total) onto the skin daily. 60 patch 2   nicotine polacrilex (NICORETTE) 2 MG gum Take 1 each (2 mg total) by mouth as needed for smoking cessation. 60 tablet 3   nitroGLYCERIN (NITROSTAT) 0.4 MG SL tablet Place 1 tablet (0.4 mg total) under the tongue every 5 (five) minutes as needed for chest pain. 30 tablet 12   rosuvastatin (CRESTOR) 40 MG tablet Take 1 tablet (40 mg total) by mouth daily. 30 tablet 11   No current facility-administered medications on  file prior to visit.    Family History  Problem Relation Age of Onset   Hypertension Mother    Seizures Father    Seizures Brother    Diabetes Maternal Aunt    Colon cancer Neg Hx    Esophageal cancer Neg Hx    Rectal cancer Neg Hx    Stomach cancer Neg Hx     Social History   Socioeconomic History   Marital status: Single    Spouse name: Not on file   Number of children: 0   Years of education: Not on file   Highest education level: Not on file  Occupational History   Not on file  Tobacco Use   Smoking status: Some Days    Current packs/day: 0.50    Types: Cigarettes   Smokeless tobacco: Never   Tobacco comments:    2 cigarettes a day   Vaping Use   Vaping status: Never Used  Substance and Sexual Activity   Alcohol use: Yes    Comment: beer every couple of days   Drug use: Not Currently    Types: Cocaine    Comment: sober 6 months   Sexual activity: Not on file  Other Topics Concern   Not on file  Social History Narrative   Not on file   Social Determinants of Health   Financial Resource Strain: High Risk (12/30/2021)   Overall Financial Resource Strain (CARDIA)    Difficulty of Paying Living Expenses: Very hard  Food Insecurity: No Food Insecurity (02/19/2022)   Hunger Vital Sign    Worried About Running Out of Food in the Last Year: Never true    Ran Out of Food in the Last Year: Never true  Transportation Needs: No Transportation Needs (12/30/2021)   PRAPARE - Administrator, Civil Service (Medical): No    Lack of Transportation (Non-Medical): No  Physical Activity: Not on file  Stress: Not on file  Social Connections: Moderately Isolated (04/28/2022)   Social Connection and Isolation Panel [NHANES]    Frequency of Communication with Friends and Family: More than three times a week    Frequency of Social Gatherings with Friends and Family: Three times a week    Attends Religious Services: More than 4 times per year    Active Member of Clubs or Organizations: No    Attends Banker Meetings: Never    Marital Status: Never married  Intimate Partner Violence: Not At Risk (02/19/2022)   Humiliation, Afraid, Rape, and Kick questionnaire    Fear of Current or Ex-Partner: No    Emotionally Abused: No    Physically Abused: No    Sexually Abused: No    Review of Systems: ROS negative except for what is noted on the assessment and plan.  Objective:   Vitals:   09/22/22 0939  BP: 109/71  Pulse: 82  Resp: (!) 28  Temp: 98.5 F (36.9 C)  TempSrc: Oral  SpO2: 97%  Weight: 258 lb 1.6 oz (117.1 kg)  Height: 5\' 9"  (1.753 m)    Physical Exam: Constitutional: well-appearing   Cardiovascular: regular rate and rhythm, no m/r/g Pulmonary/Chest: normal work of breathing on room air, lungs clear to auscultation bilaterally Abdominal: soft, non-tender, non-distended MSK: normal bulk and tone Skin: warm and dry  Assessment & Plan:  Type 2 diabetes mellitus with hyperglycemia, without long-term current use of insulin (HCC) Lab Results  Component Value Date   HGBA1C 13.7 (A) 09/08/2022  A1c has been increasing  over last year, he feels like he didn't have access to food at that time but now lives with sister and eats regularly. Started insulin and trulicity at last office visit but he was not able to get GLP-1 due to cost. He lost about 5 lbs in last 2 weeks. He does eat a lot of fried foods, his sister cooks mostly at home. His glucometer readings show elevated blood sugars with AM fasting in 200-300s.  Medications: Glargine 15 units increased to 18 units Metformin 1000 bid Ozempic sent, will need PA Consider adding  SGLT2 at follow-up  Hypertension Hydrochlorothiazide was added to losartan and amlodipine since last office visit. He feels dizzy at times when he stands up since starting new medication. His BP is well controlled at 109/71. Current medications include: Losartan-hydrochlorothiazide 100-25 mg and Amlodipine 10 mg. Orthostatics were negative. P: Stop combo pill Continue losartan and amlodipine Repeat BMP  (HFpEF) heart failure with preserved ejection fraction (HCC) Appears euvolemic on exam. Endorses adherence with lasix and requests potassium supplement refill. He has not taken K supplement in > 2 weeks since running out. P: Repeat BMP, if K wnl would not send in additional supplementation    Patient discussed with Dr. Percell Boston Travis Purk, D.O. [facility]  PGY-3 Pager: [phone]  Phone: [phone] Date 09/22/2022  Time 5:38 PM

## 2022-09-23 ENCOUNTER — Telehealth: Payer: Self-pay

## 2022-09-23 LAB — BMP8+ANION GAP
Anion Gap: 18 mmol/L (ref 10.0–18.0)
BUN/Creatinine Ratio: 12 (ref 9–20)
BUN: 12 mg/dL (ref 6–24)
CO2: 23 mmol/L (ref 20–29)
Calcium: 9.6 mg/dL (ref 8.7–10.2)
Chloride: 93 mmol/L — ABNORMAL LOW (ref 96–106)
Creatinine, Ser: 0.99 mg/dL (ref 0.76–1.27)
Glucose: 313 mg/dL — ABNORMAL HIGH (ref 70–99)
Potassium: 3.8 mmol/L (ref 3.5–5.2)
Sodium: 134 mmol/L (ref 134–144)
eGFR: 90 mL/min/{1.73_m2} (ref >59)

## 2022-09-23 NOTE — Telephone Encounter (Signed)
Morton Peters (Key: [id number]) Ozempic (0.25 or 0.5 MG/DOSE) 2MG /3ML pen-injectors Form [employer] Electronic Prior Authorization Form Created Message from [employer] by health plan.We thank you for taking the time to submit your request. However, this member has alternative pharmacy benefits. [employer] is the payer of last resort. Please have the pharmacy bill the member's other insurance plan first. If the member feels that this is in error, please have the member call Member Services at [phone]. If the requested medication is not covered or was denied by the [employer], an explanation of benefits or proof of denial must be submitted with the request prior to coverage by [facility].

## 2022-09-23 NOTE — Telephone Encounter (Signed)
I haven't received anything from the pharmacy.

## 2022-09-23 NOTE — Telephone Encounter (Signed)
Prior Authorization has been initiated awaiting approval or denial.

## 2022-09-23 NOTE — Telephone Encounter (Signed)
Prior Authorization for patient (Ozempic) was initiated on over my meds was submitted with last office notes and labs awaiting approval or denial.

## 2022-09-24 ENCOUNTER — Encounter: Payer: Self-pay | Admitting: Internal Medicine

## 2022-09-25 ENCOUNTER — Other Ambulatory Visit: Payer: Self-pay | Admitting: Internal Medicine

## 2022-09-25 DIAGNOSIS — E1165 Type 2 diabetes mellitus with hyperglycemia: Secondary | ICD-10-CM

## 2022-09-26 NOTE — Telephone Encounter (Signed)
I've already spoke with the patient he said he would take care of that.

## 2022-09-29 ENCOUNTER — Other Ambulatory Visit: Payer: Self-pay | Admitting: Internal Medicine

## 2022-10-01 NOTE — Progress Notes (Signed)
Internal Medicine Clinic Attending  Case discussed with the resident at the time of the visit.  We reviewed the resident's history and exam and pertinent patient test results.  I agree with the assessment, diagnosis, and plan of care documented in the resident's note.  Debe Coder, MD

## 2022-10-15 ENCOUNTER — Other Ambulatory Visit: Payer: Self-pay | Admitting: Internal Medicine

## 2022-10-15 DIAGNOSIS — E119 Type 2 diabetes mellitus without complications: Secondary | ICD-10-CM

## 2022-10-16 ENCOUNTER — Other Ambulatory Visit: Payer: Self-pay | Admitting: Student

## 2022-10-16 DIAGNOSIS — E119 Type 2 diabetes mellitus without complications: Secondary | ICD-10-CM

## 2022-10-16 MED ORDER — METFORMIN HCL ER 500 MG PO TB24
500.0000 mg | ORAL_TABLET | Freq: Two times a day (BID) | ORAL | 3 refills | Status: DC
Start: 2022-10-16 — End: 2022-10-20

## 2022-10-20 ENCOUNTER — Other Ambulatory Visit: Payer: Self-pay | Admitting: Student

## 2022-10-20 ENCOUNTER — Encounter: Payer: Self-pay | Admitting: Student

## 2022-10-20 ENCOUNTER — Ambulatory Visit (INDEPENDENT_AMBULATORY_CARE_PROVIDER_SITE_OTHER): Payer: BLUE CROSS/BLUE SHIELD | Admitting: Student

## 2022-10-20 ENCOUNTER — Other Ambulatory Visit (HOSPITAL_COMMUNITY): Payer: Self-pay

## 2022-10-20 ENCOUNTER — Other Ambulatory Visit: Payer: Self-pay

## 2022-10-20 ENCOUNTER — Encounter: Payer: BLUE CROSS/BLUE SHIELD | Admitting: Dietician

## 2022-10-20 ENCOUNTER — Telehealth: Payer: Self-pay | Admitting: Dietician

## 2022-10-20 VITALS — BP 134/81 | HR 91 | Temp 98.4°F | Resp 28 | Ht 69.0 in | Wt 261.5 lb

## 2022-10-20 DIAGNOSIS — E1165 Type 2 diabetes mellitus with hyperglycemia: Secondary | ICD-10-CM

## 2022-10-20 DIAGNOSIS — Z794 Long term (current) use of insulin: Secondary | ICD-10-CM | POA: Diagnosis not present

## 2022-10-20 DIAGNOSIS — I10 Essential (primary) hypertension: Secondary | ICD-10-CM | POA: Diagnosis not present

## 2022-10-20 DIAGNOSIS — Z7984 Long term (current) use of oral hypoglycemic drugs: Secondary | ICD-10-CM

## 2022-10-20 DIAGNOSIS — F17210 Nicotine dependence, cigarettes, uncomplicated: Secondary | ICD-10-CM

## 2022-10-20 MED ORDER — BASAGLAR KWIKPEN 100 UNIT/ML ~~LOC~~ SOPN
21.0000 [IU] | PEN_INJECTOR | Freq: Every day | SUBCUTANEOUS | 0 refills | Status: DC
Start: 2022-10-20 — End: 2022-11-20

## 2022-10-20 MED ORDER — SYNJARDY 12.5-1000 MG PO TABS: 1.0000 | ORAL_TABLET | Freq: Every day | ORAL | 3 refills | Status: DC

## 2022-10-20 NOTE — Assessment & Plan Note (Addendum)
Patient presents with a history of hypertension.  He is here for a 1 month follow-up after discontinuing losartan-hydrochlorothiazide combination drug due to dizziness.  His current regimen is losartan, amlodipine, as well as other medications currently used for treatment of CAD and HFpEF: Lasix and Toprol-XL.  Patient reported that his dizziness has resolved since discontinuing the losartan hydrochlorothiazide combination.  He checks his blood pressures at home which will range from 120-130/80-90.  He denies hypotensive and hypertensive symptoms. Plan -Continue losartan 100 mg -Continue amlodipine 10 mg

## 2022-10-20 NOTE — Assessment & Plan Note (Signed)
Patient presents for a 1 month follow-up on type 2 diabetes mellitus.  A1c in July was 13.7.  During last visit, the glargine was increased from 15 units to 18 units and Ozempic order was placed.  Today, the patient reported that the Ozempic was not covered by insurance and cost too much money.  He is using glargine 18 units he reports fasting glucose in the lower 200s.  He denies hypoglycemia, weight loss, polyuria and polydipsia. Plan -Increase glargine to 21 units for a fasting BG goal of 90-140 -Begin Synjardy 12.5-1000mg : patient was informed to discontinue metformin when he begins synjardy -Due to financial barriers, patient was instructed to continue metformin only if he is unable to afford synjardy   -Patient instructed to call the office is synjardy is not covered by insurance  -Return in one month

## 2022-10-20 NOTE — Telephone Encounter (Signed)
Called phone listed. It is not Mr. Vedder but person answering says he can be reached there in the mornings.

## 2022-10-20 NOTE — Progress Notes (Signed)
Established Patient Office Visit  Subjective   Patient ID: Dillon Wang, male    DOB: 1966/01/04  Age: 57 y.o. MRN: [id number]  Chief Complaint  Patient presents with   Follow-up   Medication Refill    Dillon Wang is a 57 year old male who presents for a 1 month follow-up of hypertension and diabetes. Please see problem based assessment and plan for additional details.     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  Eyes:  Negative for blurred vision and double vision.  Respiratory:  Negative for sputum production.   Cardiovascular:  Negative for chest pain.  Genitourinary:  Negative for frequency.  Neurological:  Negative for dizziness and headaches.      Objective:     BP 134/81 (BP Location: Right Arm, Patient Position: Sitting, Cuff Size: Large)   Pulse 91   Temp 98.4 F (36.9 C) (Oral)   Resp (!) 28   Ht 5\' 9"  (1.753 m)   Wt 261 lb 8 oz (118.6 kg)   SpO2 96%   BMI 38.62 kg/m  BP Readings from Last 3 Encounters:  10/20/22 134/81  09/22/22 109/71  09/08/22 (!) 163/95      Physical Exam Constitutional:      General: He is not in acute distress.    Appearance: He is obese. He is not ill-appearing or toxic-appearing.  Cardiovascular:     Rate and Rhythm: Normal rate and regular rhythm.     Heart sounds: Normal heart sounds. No murmur heard. Pulmonary:     Effort: Pulmonary effort is  normal. No respiratory distress.     Breath sounds: Normal breath sounds.  Musculoskeletal:     Right lower leg: No edema.     Left lower leg: No edema.  Skin:    General: Skin is warm and dry.  Neurological:     Mental Status: He is alert and oriented to person, place, and time.  Psychiatric:        Mood and Affect: Mood normal.      No results found for any visits on 10/20/22.  Last CBC Lab Results  Component Value Date   WBC 11.7 (H) 10/29/2021   HGB 17.7 (H) 10/29/2021   HCT 51.2 10/29/2021   MCV 90.0 10/29/2021   MCH 31.1 10/29/2021   RDW 13.3 10/29/2021   PLT 189 10/29/2021   Last metabolic panel Lab Results  Component Value Date   GLUCOSE 313 (H) 09/22/2022   NA 134 09/22/2022   K 3.8 09/22/2022   CL 93 (L) 09/22/2022   CO2 23 09/22/2022   BUN 12 09/22/2022   CREATININE 0.99 09/22/2022   EGFR 90 09/22/2022   CALCIUM 9.6 09/22/2022   PHOS 2.9 02/21/2018   PROT 6.9 10/27/2021   ALBUMIN 3.6 10/27/2021   LABGLOB 2.6 01/12/2018   AGRATIO 1.9 01/12/2018   BILITOT 0.5 10/27/2021  ALKPHOS 67 10/27/2021   AST 23 10/27/2021   ALT 22 10/27/2021   ANIONGAP 11 10/29/2021   Last hemoglobin A1c Lab Results  Component Value Date   HGBA1C 13.7 (A) 09/08/2022      The ASCVD Risk score (Arnett DK, et al., 2019) failed to calculate for the following reasons:   The patient has a prior MI or stroke diagnosis    Assessment & Plan:   Problem List Items Addressed This Visit       Cardiovascular and Mediastinum   Hypertension - Primary    Patient presents with a history of hypertension.  He is here for a 1 month follow-up after discontinuing losartan-hydrochlorothiazide combination drug due to dizziness.  His current regimen is losartan, amlodipine, as well as other medications currently used for treatment of CAD and HFpEF: Lasix and Toprol-XL.  Patient reported that his dizziness has resolved since discontinuing the losartan hydrochlorothiazide combination.  He  checks his blood pressures at home which will range from 120-130/80-90.  He denies hypotensive and hypertensive symptoms. Plan -Continue losartan 100 mg -Continue amlodipine 10 mg        Endocrine   Type 2 diabetes mellitus with hyperglycemia, without long-term current use of insulin (HCC)    Patient presents for a 1 month follow-up on type 2 diabetes mellitus.  A1c in July was 13.7.  During last visit, the glargine was increased from 15 units to 18 units and Ozempic order was placed.  Today, the patient reported that the Ozempic was not covered by insurance and cost too much money.  He is using glargine 18 units he reports fasting glucose in the lower 200s.  He denies hypoglycemia, weight loss, polyuria and polydipsia. Plan -Increase glargine to 21 units for a fasting BG goal of 90-140 -Begin Synjardy 12.5-1000mg : patient was informed to discontinue metformin when he begins synjardy -Due to financial barriers, patient was instructed to continue metformin only if he is unable to afford synjardy   -Patient instructed to call the office is synjardy is not covered by insurance  -Return in one month        Relevant Medications   Empagliflozin-metFORMIN HCl (SYNJARDY) 12.07-998 MG TABS   Insulin Glargine (BASAGLAR KWIKPEN) 100 UNIT/ML      Return in about 4 weeks (around 11/17/2022) for Diabetes .    Faith Rogue, DO

## 2022-10-20 NOTE — Telephone Encounter (Signed)
All to patient to try to reschedule missed appointment today.

## 2022-10-20 NOTE — Patient Instructions (Signed)
Thank you, [name] for allowing [place] to provide your care today. Today we discussed Diabetes and hypertension.      I have ordered the following medication/changed the following medications:   Stop the following medications: Medications Discontinued During This Encounter  Medication Reason   nicotine (NICODERM CQ - DOSED IN MG/24 HOURS) 14 mg/24hr patch Completed Course   Semaglutide,0.25 or 0.5MG /DOS, 2 MG/3ML SOPN Cost of medication   metFORMIN (GLUCOPHAGE-XR) 500 MG 24 hr tablet Change in therapy   Insulin Glargine (BASAGLAR KWIKPEN) 100 UNIT/ML Reorder     Start the following medications: Meds ordered this encounter  Medications   Empagliflozin-metFORMIN HCl (SYNJARDY) 12.07-998 MG TABS    Sig: Take 1 tablet by mouth daily.    Dispense:  90 tablet    Refill:  3   Insulin Glargine (BASAGLAR KWIKPEN) 100 UNIT/ML    Sig: Inject 21 Units into the skin at bedtime.    Dispense:  15 mL    Refill:  0     Follow up:  1  month      Remember: To STOP the metformin once you pick up the Synjardy (if insurance does not cover the synjardy: do NOT stop the metformin)   Should you have any questions or concerns please call the internal medicine clinic at [phone].     Faith Rogue, D.O. [facility]

## 2022-10-21 NOTE — Progress Notes (Signed)
Internal Medicine Clinic Attending  I was physically present during the key portions of the resident provided service and participated in the medical decision making of patient's management care. I reviewed pertinent patient test results.  The assessment, diagnosis, and plan were formulated together and I agree with the documentation in the resident's note.  Guilloud, Carolyn, MD  

## 2022-10-22 ENCOUNTER — Telehealth: Payer: Self-pay

## 2022-10-22 ENCOUNTER — Other Ambulatory Visit (HOSPITAL_COMMUNITY): Payer: Self-pay

## 2022-10-22 NOTE — Telephone Encounter (Signed)
Pharmacy Patient Advocate Encounter   Received notification from Physician's Office that prior authorization for [facility] is required/requested.   Insurance verification completed.   The patient is insured through [employer] .   Per test claim: PA required; PA started via CoverMyMeds. KEY [id number] . Waiting for clinical questions to populate. Marland Kitchen

## 2022-10-22 NOTE — Telephone Encounter (Signed)
No, PA was started today by [employer].

## 2022-10-24 ENCOUNTER — Telehealth: Payer: Self-pay

## 2022-10-24 NOTE — Telephone Encounter (Signed)
Pharmacy Patient Advocate Encounter   PA required; PA submitted to [facility] via CoverMyMeds Key/confirmation #/EOC [id number]. Status is pending

## 2022-10-24 NOTE — Telephone Encounter (Signed)
error 

## 2022-10-27 ENCOUNTER — Encounter: Payer: Self-pay | Admitting: Dietician

## 2022-10-27 NOTE — Telephone Encounter (Signed)
Unable to reach patient by phone to reschedule appointment. Will mail him a letter.

## 2022-10-29 ENCOUNTER — Other Ambulatory Visit: Payer: Self-pay | Admitting: Student

## 2022-10-29 DIAGNOSIS — F17210 Nicotine dependence, cigarettes, uncomplicated: Secondary | ICD-10-CM

## 2022-10-29 MED ORDER — NICOTINE POLACRILEX 2 MG MT GUM
2.0000 mg | CHEWING_GUM | OROMUCOSAL | 3 refills | Status: DC | PRN
Start: 2022-10-29 — End: 2022-10-29

## 2022-10-29 NOTE — Telephone Encounter (Signed)
Pharmacy Patient Advocate Encounter  Received notification from [facility] that Prior Authorization for [facility] has been  CANCELLED:    Patient has primary commercial insurance coverage. [Please have the pharmacy bill the member's other insurance plan first. If the member feels that this is in error, please have the member call Member Services at [phone]]  PA #/Case ID/Reference #: [id number]

## 2022-11-03 ENCOUNTER — Other Ambulatory Visit: Payer: Self-pay | Admitting: Student

## 2022-11-03 DIAGNOSIS — I10 Essential (primary) hypertension: Secondary | ICD-10-CM

## 2022-11-06 NOTE — Progress Notes (Signed)
This encounter was created in error - please disregard.

## 2022-11-20 ENCOUNTER — Ambulatory Visit: Payer: BLUE CROSS/BLUE SHIELD | Admitting: Internal Medicine

## 2022-11-20 ENCOUNTER — Encounter: Payer: Self-pay | Admitting: Internal Medicine

## 2022-11-20 ENCOUNTER — Other Ambulatory Visit: Payer: Self-pay

## 2022-11-20 VITALS — BP 150/97 | HR 77 | Temp 98.2°F | Ht 69.0 in | Wt 270.2 lb

## 2022-11-20 DIAGNOSIS — I10 Essential (primary) hypertension: Secondary | ICD-10-CM | POA: Diagnosis not present

## 2022-11-20 DIAGNOSIS — E1165 Type 2 diabetes mellitus with hyperglycemia: Secondary | ICD-10-CM

## 2022-11-20 DIAGNOSIS — Z7984 Long term (current) use of oral hypoglycemic drugs: Secondary | ICD-10-CM | POA: Diagnosis not present

## 2022-11-20 DIAGNOSIS — Z0000 Encounter for general adult medical examination without abnormal findings: Secondary | ICD-10-CM

## 2022-11-20 DIAGNOSIS — Z794 Long term (current) use of insulin: Secondary | ICD-10-CM | POA: Diagnosis not present

## 2022-11-20 MED ORDER — LOSARTAN POTASSIUM 100 MG PO TABS
100.0000 mg | ORAL_TABLET | Freq: Every day | ORAL | 11 refills | Status: AC
Start: 2022-11-20 — End: 2023-11-20

## 2022-11-20 MED ORDER — LOSARTAN POTASSIUM-HCTZ 50-12.5 MG PO TABS: 2.0000 | ORAL_TABLET | Freq: Every day | ORAL | 3 refills | Status: DC

## 2022-11-20 MED ORDER — LOSARTAN POTASSIUM-HCTZ 50-12.5 MG PO TABS: 1.0000 | ORAL_TABLET | Freq: Every day | ORAL | 3 refills | Status: DC

## 2022-11-20 MED ORDER — BASAGLAR KWIKPEN 100 UNIT/ML ~~LOC~~ SOPN
25.0000 [IU] | PEN_INJECTOR | Freq: Every day | SUBCUTANEOUS | 0 refills | Status: DC
Start: 2022-11-20 — End: 2023-02-09

## 2022-11-20 MED ORDER — CARVEDILOL 12.5 MG PO TABS: 12.5000 mg | ORAL_TABLET | Freq: Every day | ORAL | 11 refills | Status: AC

## 2022-11-20 MED ORDER — EMPAGLIFLOZIN 10 MG PO TABS: 10.0000 mg | ORAL_TABLET | Freq: Every day | ORAL | 2 refills | Status: DC

## 2022-11-20 NOTE — Patient Instructions (Signed)
Dillon Wang,   It was a pleasure meeting you today,   Here are the medication adjustments I made today,   Please start taking Hyzar instead of your losartan. Continue taking amlodipine.   Please take 25 units of Glargine at night. I am also writing a prescription for a medication called Jardiance. Please call the clinic if you are not able to pick this up at the pharmacy.   We will plan to see you in another month for follow up and A1c check.   Thanks  Dr Carlynn Purl

## 2022-11-20 NOTE — Progress Notes (Signed)
Subjective:  CC: follow up DM, HTN  HPI:  [name] is a 57 y.o. male with a past medical history stated below and presents today for above. Please see problem based assessment and plan for additional details.  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 18 g 2   amLODipine (NORVASC) 10 MG tablet TAKE 1 TABLET BY MOUTH EVERY DAY 90 tablet 2   aspirin 81 MG chewable tablet Chew 1 tablet (81 mg total) by mouth daily. 90 tablet 3   blood glucose meter kit and supplies KIT Inject 1 each into the skin as directed. Dispense based on patient and insurance preference. Check blood sugar daily prior to eating breakfast. 1 each 0   budesonide-formoterol (SYMBICORT) 160-4.5 MCG/ACT inhaler Inhale 2 puffs into the lungs 2 (two) times daily. 10.2 g 3   clopidogrel (PLAVIX) 75 MG tablet Take 1 tablet (75 mg total) by mouth daily. 30 tablet 11   Evolocumab (REPATHA SURECLICK) 140 MG/ML SOAJ Inject 140 mg into the skin every 14 (fourteen) days. 6 mL 5   furosemide (LASIX) 40 MG tablet TAKE 1 TABLET BY MOUTH TWICE A DAY 60 tablet 6   glucose blood (ONETOUCH VERIO) test strip USE TO CHECK BLOOD SUGAR DAILY PRIOR TO EATING BREAKFAST 100 strip 1   Insulin Pen  Needle (PEN NEEDLES) 32G X 4 MM MISC 1 Needle by Does not apply route at bedtime. 100 each 3   Lancets (ONETOUCH DELICA PLUS LANCET30G) MISC USE TO CHECK BLOOD SUGAR DAILY PRIOR TO BREAKFAST 100 each 0   levETIRAcetam (KEPPRA) 750 MG tablet TAKE 1 TABLET (750MG  TOTAL) BY MOUTH TWICE A DAY 180 tablet 3   Misc. Devices MISC Auto bipap with Medium size [employer] Full Face Mask Simplus mask/ heated humidification. Autopap settings 15-20 cm H2O. 1 each 0   nicotine polacrilex (NICORETTE) 2 MG gum TAKE 1 EACH (2 MG TOTAL) BY MOUTH AS NEEDED FOR SMOKING CESSATION. 600 each 0   nitroGLYCERIN (NITROSTAT) 0.4 MG SL tablet Place 1 tablet (0.4 mg total) under the tongue every 5 (five) minutes as needed for chest pain. 30 tablet 12   rosuvastatin (CRESTOR) 40 MG tablet Take 1 tablet (40 mg total) by mouth daily. 30 tablet 11   No current facility-administered medications on file prior to visit.    Review of Systems: ROS negative except for as is noted on the assessment and plan.  Objective:   Vitals:   11/20/22 1032 11/20/22 1054  BP: (!) 144/87 (!) 150/97  Pulse: 78 77  Temp: 98.2 F (36.8 C)   TempSrc: Oral   SpO2: 97%   Weight: 270 lb 3.2 oz (122.6 kg)   Height: 5\' 9"  (1.753 m)  Physical Exam: Constitutional: well-appearing, in no acute distress HENT: normocephalic atraumatic, mucous membranes moist Eyes: conjunctiva non-erythematous Neck: supple Cardiovascular: regular rate and rhythm, no m/r/g Pulmonary/Chest: normal work of breathing on room air, lungs clear to auscultation bilaterally Abdominal: soft, non-tender, non-distended MSK: normal bulk and tone Neurological: alert & oriented x 3, 5/5 strength in bilateral upper and lower extremities, normal gait Skin: warm and dry  Assessment & Plan:   Hypertension BP today is 144/87, on recheck it is 150/97. Current regimen is losartan 100mg  and amlodipine 10mg . Patient also takes Toprolol 25mg  for HFpEF.  -Continue losartan  100mg , amlodipine 10mg . Changing Toprolol to Coreg 12.5 -Return in one month for BP recheck and med adjustment if needed  Type 2 diabetes mellitus with hyperglycemia, without long-term current use of insulin (HCC) Last A1c in July was 13.7. Current regimen is Glargine 21 units daily, metformin 1000 BID. Kirk Ruths was prescribed, but patient could not afford prescription. CGM shows average BG 250. No hypoglycemic episodes.  -Increase Glargine to 25 units daily, continue metformin 1000mg  BID -Jardiance 10mg  prescribed with instructions to call clinic if it is too expensive -Follow up in one month for A1c and med adjustment if needed   Patient seen with Dr. Bari Edward MD [facility] Internal Medicine  PGY-1 Pager: [phone] Date 11/20/2022  Time 11:22 AM

## 2022-11-20 NOTE — Assessment & Plan Note (Addendum)
BP today is 144/87, on recheck it is 150/97. Current regimen is losartan 100mg  and amlodipine 10mg . Patient also takes Toprolol 25mg  for HFpEF.  -Continue losartan 100mg , amlodipine 10mg . Changing Toprolol to Coreg 12.5 -Return in one month for BP recheck and med adjustment if needed

## 2022-11-20 NOTE — Assessment & Plan Note (Addendum)
Last A1c in July was 13.7. Current regimen is Glargine 21 units daily, metformin 1000 BID. Kirk Ruths was prescribed, but patient could not afford prescription. CGM shows average BG 250. No hypoglycemic episodes.  -Increase Glargine to 25 units daily, continue metformin 1000mg  BID -Jardiance 10mg  prescribed with instructions to call clinic if it is too expensive -Follow up in one month for A1c and med adjustment if needed

## 2022-11-24 ENCOUNTER — Encounter: Payer: BLUE CROSS/BLUE SHIELD | Admitting: Dietician

## 2022-11-26 ENCOUNTER — Telehealth: Payer: Self-pay

## 2022-11-26 NOTE — Telephone Encounter (Signed)
Pt called stated something was wrong with his meds ( CARVEDILOL, LOSARTAN , JARDIANCE )  I reached out to the pharmacy  to find  why patient can't  get his meds ... Pharmacy re ran the med and stated the  message that is coming up is pt has another insurance as payor... I tried to reach pt to tell him he needs to get incontact with the pharmacy with his RX insurance and if he does not have another insurance he needs to call the one he does have and notify he only have one  carriers and not two

## 2022-11-27 NOTE — Progress Notes (Signed)
 Internal Medicine Clinic Attending  I was physically present during the key portions of the resident provided service and participated in the medical decision making of patient's management care. I reviewed pertinent patient test results.  The assessment, diagnosis, and plan were formulated together and I agree with the documentation in the resident's note.  Inez Catalina, MD

## 2022-12-22 ENCOUNTER — Encounter: Payer: BLUE CROSS/BLUE SHIELD | Admitting: Internal Medicine

## 2023-01-22 ENCOUNTER — Other Ambulatory Visit: Payer: Self-pay | Admitting: Student

## 2023-01-22 DIAGNOSIS — E119 Type 2 diabetes mellitus without complications: Secondary | ICD-10-CM

## 2023-02-07 ENCOUNTER — Other Ambulatory Visit: Payer: Self-pay | Admitting: Internal Medicine

## 2023-02-07 DIAGNOSIS — E1165 Type 2 diabetes mellitus with hyperglycemia: Secondary | ICD-10-CM

## 2023-02-09 ENCOUNTER — Other Ambulatory Visit: Payer: Self-pay | Admitting: Cardiology

## 2023-02-09 ENCOUNTER — Other Ambulatory Visit: Payer: Self-pay | Admitting: Internal Medicine

## 2023-02-09 DIAGNOSIS — I252 Old myocardial infarction: Secondary | ICD-10-CM

## 2023-02-09 DIAGNOSIS — I10 Essential (primary) hypertension: Secondary | ICD-10-CM

## 2023-02-09 NOTE — Telephone Encounter (Signed)
 Next appt scheduled 03/16/23 with PCP.

## 2023-02-20 ENCOUNTER — Other Ambulatory Visit: Payer: Self-pay | Admitting: Internal Medicine

## 2023-02-20 DIAGNOSIS — E1165 Type 2 diabetes mellitus with hyperglycemia: Secondary | ICD-10-CM

## 2023-02-20 NOTE — Telephone Encounter (Signed)
 Next appt scheduled 03/16/23 with PCP.

## 2023-02-24 ENCOUNTER — Other Ambulatory Visit: Payer: Self-pay | Admitting: Student

## 2023-02-24 DIAGNOSIS — E1165 Type 2 diabetes mellitus with hyperglycemia: Secondary | ICD-10-CM

## 2023-02-26 ENCOUNTER — Other Ambulatory Visit: Payer: Self-pay | Admitting: Cardiology

## 2023-02-26 DIAGNOSIS — I252 Old myocardial infarction: Secondary | ICD-10-CM

## 2023-03-02 ENCOUNTER — Other Ambulatory Visit: Payer: Self-pay | Admitting: Internal Medicine

## 2023-03-02 ENCOUNTER — Other Ambulatory Visit: Payer: Self-pay | Admitting: Cardiology

## 2023-03-02 DIAGNOSIS — E1165 Type 2 diabetes mellitus with hyperglycemia: Secondary | ICD-10-CM

## 2023-03-16 ENCOUNTER — Encounter: Payer: Medicaid Other | Admitting: Student

## 2023-03-30 ENCOUNTER — Ambulatory Visit: Payer: Medicaid Other | Admitting: Student

## 2023-03-30 ENCOUNTER — Encounter: Payer: Self-pay | Admitting: Student

## 2023-03-30 ENCOUNTER — Other Ambulatory Visit: Payer: Self-pay

## 2023-03-30 VITALS — BP 139/78 | HR 86 | Temp 97.7°F | Ht 69.0 in | Wt 276.1 lb

## 2023-03-30 DIAGNOSIS — Z794 Long term (current) use of insulin: Secondary | ICD-10-CM

## 2023-03-30 DIAGNOSIS — E782 Mixed hyperlipidemia: Secondary | ICD-10-CM

## 2023-03-30 DIAGNOSIS — J45909 Unspecified asthma, uncomplicated: Secondary | ICD-10-CM | POA: Diagnosis not present

## 2023-03-30 DIAGNOSIS — E1169 Type 2 diabetes mellitus with other specified complication: Secondary | ICD-10-CM

## 2023-03-30 DIAGNOSIS — Z23 Encounter for immunization: Secondary | ICD-10-CM | POA: Diagnosis not present

## 2023-03-30 DIAGNOSIS — R0600 Dyspnea, unspecified: Secondary | ICD-10-CM | POA: Diagnosis not present

## 2023-03-30 DIAGNOSIS — Z7984 Long term (current) use of oral hypoglycemic drugs: Secondary | ICD-10-CM

## 2023-03-30 DIAGNOSIS — Z1211 Encounter for screening for malignant neoplasm of colon: Secondary | ICD-10-CM

## 2023-03-30 DIAGNOSIS — I10 Essential (primary) hypertension: Secondary | ICD-10-CM

## 2023-03-30 DIAGNOSIS — Z0000 Encounter for general adult medical examination without abnormal findings: Secondary | ICD-10-CM

## 2023-03-30 DIAGNOSIS — E1165 Type 2 diabetes mellitus with hyperglycemia: Secondary | ICD-10-CM

## 2023-03-30 DIAGNOSIS — E119 Type 2 diabetes mellitus without complications: Secondary | ICD-10-CM

## 2023-03-30 DIAGNOSIS — I25118 Atherosclerotic heart disease of native coronary artery with other forms of angina pectoris: Secondary | ICD-10-CM

## 2023-03-30 DIAGNOSIS — Z139 Encounter for screening, unspecified: Secondary | ICD-10-CM

## 2023-03-30 DIAGNOSIS — R0601 Orthopnea: Secondary | ICD-10-CM

## 2023-03-30 LAB — BRAIN NATRIURETIC PEPTIDE: B Natriuretic Peptide: 32.8 pg/mL (ref 0.0–100.0)

## 2023-03-30 LAB — POCT GLYCOSYLATED HEMOGLOBIN (HGB A1C): Hemoglobin A1C: 8.5 % — AB (ref 4.0–5.6)

## 2023-03-30 LAB — GLUCOSE, CAPILLARY: Glucose-Capillary: 334 mg/dL — ABNORMAL HIGH (ref 70–99)

## 2023-03-30 MED ORDER — ALBUTEROL SULFATE HFA 108 (90 BASE) MCG/ACT IN AERS: 2.0000 | INHALATION_SPRAY | RESPIRATORY_TRACT | 2 refills | Status: AC | PRN

## 2023-03-30 MED ORDER — BUDESONIDE-FORMOTEROL FUMARATE 160-4.5 MCG/ACT IN AERO: 2.0000 | INHALATION_SPRAY | Freq: Two times a day (BID) | RESPIRATORY_TRACT | 3 refills | Status: AC

## 2023-03-30 MED ORDER — ONETOUCH VERIO VI STRP: ORAL_STRIP | 1 refills | Status: AC

## 2023-03-30 MED ORDER — ASPIRIN 81 MG PO CHEW: 81.0000 mg | CHEWABLE_TABLET | Freq: Every day | ORAL | 3 refills | Status: AC

## 2023-03-30 MED ORDER — PEN NEEDLES 32G X 4 MM MISC: 1.0000 | Freq: Every day | 3 refills | Status: AC

## 2023-03-30 MED ORDER — METFORMIN HCL ER 500 MG PO TB24: 1500.0000 mg | ORAL_TABLET | Freq: Every day | ORAL | 0 refills | Status: DC

## 2023-03-30 MED ORDER — ROSUVASTATIN CALCIUM 40 MG PO TABS: 40.0000 mg | ORAL_TABLET | Freq: Every day | ORAL | 11 refills | Status: AC

## 2023-03-30 NOTE — Patient Instructions (Addendum)
Thank you, [name] for allowing [place] to provide your care today. Today we discussed blood pressure, diabetes, and extra fluid.    I have ordered the following labs for you:   Lab Orders         Microalbumin / Creatinine Urine Ratio         Glucose, capillary         Basic metabolic panel         Brain natriuretic peptide         POC Hbg A1C        Referrals ordered today:    Referral Orders         Ambulatory referral to Ophthalmology         Ambulatory referral to Gastroenterology         Ambulatory referral to Social Work      I have ordered the following medication/changed the following medications:   Stop the following medications: Medications Discontinued During This Encounter  Medication Reason   rosuvastatin (CRESTOR) 40 MG tablet Reorder   albuterol (VENTOLIN HFA) 108 (90 Base) MCG/ACT inhaler Reorder   budesonide-formoterol (SYMBICORT) 160-4.5 MCG/ACT inhaler Reorder     Start the following medications: Meds ordered this encounter  Medications   rosuvastatin (CRESTOR) 40 MG tablet    Sig: Take 1 tablet (40 mg total) by mouth daily.    Dispense:  30 tablet    Refill:  11   albuterol (VENTOLIN HFA) 108 (90 Base) MCG/ACT inhaler    Sig: Inhale 2 puffs into the lungs every 4 (four) hours as needed for wheezing or shortness of breath.    Dispense:  18 g    Refill:  2   budesonide-formoterol (SYMBICORT) 160-4.5 MCG/ACT inhaler    Sig: Inhale 2 puffs into the lungs 2 (two) times daily.    Dispense:  10.2 g    Refill:  3     Follow up: 2 weeks dyspnea   Remember: To increase your lasix to 60 mg daily until you see [place] in 2 weeks. Please stop taking Plavix. If your shortness of breath worsens, please call us or be evaluated in the emergency department. Continue to check your weight daily.   Should you have any questions or concerns please call the internal medicine clinic at [phone].     Please note that our late policy has changed.  If you are more  than 15 minutes late to your appointment, you may be asked to reschedule your appointment.  Dr. Hessie Diener, D.O. [facility]

## 2023-03-30 NOTE — Progress Notes (Addendum)
Established Patient Office Visit  Subjective   Patient ID: Dillon Wang, male    DOB: Aug 27, 1965  Age: 58 y.o. MRN: [id number]  Chief Complaint  Patient presents with   Follow-up   Diabetes   Hypertension   Hyperlipidemia    Dillon Wang is a 58 y.o. who presents to the clinic for T2DM and HTN follow up as well as dyspnea. Patient presents to clinic with 1 month of increasing shortness of breath, wheezing, increased cough at night, nonproductive cough.  Patient reports increased orthopnea as well.  He stated that his shortness of breath worsened after he ran out of both albuterol and Symbicort.  He is still smoking cigarettes but is down to 1 pack a day Please see problem based assessment and plan for additional details.   Patient Active Problem List   Diagnosis Date Noted   Encounter for screening involving social determinants of health (SDoH) 03/31/2023   (HFpEF) heart failure with preserved ejection fraction (HCC) 09/09/2022   Coronary artery disease of native artery of native heart with stable angina pectoris (HCC) 02/07/2022   Dyspnea 02/07/2022   Seizures (HCC) 12/26/2021   Healthcare maintenance 12/26/2021   History of non-ST elevation myocardial infarction (NSTEMI) 11/06/2021   Hyperlipidemia associated with type 2 diabetes mellitus (HCC) 11/06/2021   Type 2 diabetes mellitus with hyperglycemia, without long-term current use of insulin (HCC) 09/20/2018   Obstructive sleep apnea 09/20/2017   Morbid obesity (HCC) 09/20/2017   Hypertension    Cocaine abuse (HCC)    Tobacco use disorder    Alcohol abuse       Objective:     BP 139/78 (BP Location: Left Arm, Cuff Size: Large)   Pulse 86   Temp 97.7 F (36.5 C) (Oral)   Ht 5\' 9"  (1.753 m)   Wt 276 lb 1.6 oz (125.2 kg)   SpO2 96%   BMI 40.77 kg/m  BP Readings from Last 3 Encounters:  03/30/23 139/78  11/20/22 (!) 150/97  10/20/22 134/81   Wt Readings from Last 3 Encounters:  03/30/23 276 lb 1.6 oz (125.2 kg)   11/20/22 270 lb 3.2 oz (122.6 kg)  10/20/22 261 lb 8 oz (118.6 kg)      Physical Exam Vitals reviewed.  Constitutional:      General: He is not in acute distress.    Appearance: He is morbidly obese. He is not ill-appearing, toxic-appearing or diaphoretic.  Cardiovascular:     Rate and Rhythm: Normal rate and regular rhythm.     Heart sounds: Normal heart sounds. No murmur heard.    Comments: Bilateral lower extremity pitting edema extending to level of the mid anterior shin Pulmonary:     Effort: Pulmonary effort is normal. No respiratory distress.     Breath sounds: No stridor. Rales (Bibasilar rales present) present. No wheezing or rhonchi.  Musculoskeletal:     Right lower leg: 1+ Pitting Edema present.     Left lower leg: 1+ Pitting Edema present.  Skin:    General: Skin is warm and dry.  Neurological:     Mental Status: He is alert.  Psychiatric:        Behavior: Behavior is cooperative.    Results for orders placed or performed in visit on 03/30/23  Glucose, capillary  Result Value Ref Range   Glucose-Capillary 334 (H) 70 - 99 mg/dL  Basic metabolic panel  Result Value Ref Range   Glucose 302 (H) 70 - 99 mg/dL   BUN 17 6 -  24 mg/dL   Creatinine, Ser 1.61 0.76 - 1.27 mg/dL   eGFR 75 >09 UE/AVW/0.98   BUN/Creatinine Ratio 15 9 - 20   Sodium 137 134 - 144 mmol/L   Potassium 4.5 3.5 - 5.2 mmol/L   Chloride 102 96 - 106 mmol/L   CO2 19 (L) 20 - 29 mmol/L   Calcium 9.3 8.7 - 10.2 mg/dL  Brain natriuretic peptide  Result Value Ref Range   B Natriuretic Peptide 32.8 0.0 - 100.0 pg/mL  Lipid Profile  Result Value Ref Range   Cholesterol, Total 220 (H) 100 - 199 mg/dL   Triglycerides 119 (H) 0 - 149 mg/dL   HDL 31 (L) >14 mg/dL   VLDL Cholesterol Cal 60 (H) 5 - 40 mg/dL   LDL Chol Calc (NIH) 782 (H) 0 - 99 mg/dL   Chol/HDL Ratio 7.1 (H) 0.0 - 5.0 ratio  POC Hbg A1C  Result Value Ref Range   Hemoglobin A1C 8.5 (A) 4.0 - 5.6 %   HbA1c POC (<> result, manual  entry)     HbA1c, POC (prediabetic range)     HbA1c, POC (controlled diabetic range)      Last metabolic panel Lab Results  Component Value Date   GLUCOSE 302 (H) 03/30/2023   NA 137 03/30/2023   K 4.5 03/30/2023   CL 102 03/30/2023   CO2 19 (L) 03/30/2023   BUN 17 03/30/2023   CREATININE 1.15 03/30/2023   EGFR 75 03/30/2023   CALCIUM 9.3 03/30/2023   PHOS 2.9 02/21/2018   PROT 6.9 10/27/2021   ALBUMIN 3.6 10/27/2021   LABGLOB 2.6 01/12/2018   AGRATIO 1.9 01/12/2018   BILITOT 0.5 10/27/2021   ALKPHOS 67 10/27/2021   AST 23 10/27/2021   ALT 22 10/27/2021   ANIONGAP 11 10/29/2021   Last hemoglobin A1c Lab Results  Component Value Date   HGBA1C 8.5 (A) 03/30/2023      The ASCVD Risk score (Arnett DK, et al., 2019) failed to calculate for the following reasons:   Risk score cannot be calculated because patient has a medical history suggesting prior/existing ASCVD    Assessment & Plan:   Problem List Items Addressed This Visit       Cardiovascular and Mediastinum   Hypertension   Patient presents with a history of hypertension with a blood pressure today of 139/78. Their hypertension is controlled on a regimen of amlodipine 10 mg, Coreg 12.5 mg daily, and losartan 100 mg daily.   Plan: -Continue current regimen [id number] 10 mg, Coreg 12.5 mg daily and , losartan 100 mg daily -BMP today       Relevant Medications   rosuvastatin (CRESTOR) 40 MG tablet   aspirin ([employer] ASPIRIN ADULT LOW DOSE) 81 MG chewable tablet   Coronary artery disease of native artery of native heart with stable angina pectoris (HCC)   Relevant Medications   rosuvastatin (CRESTOR) 40 MG tablet   aspirin ([employer] ASPIRIN ADULT LOW DOSE) 81 MG chewable tablet     Endocrine   Type 2 diabetes mellitus with hyperglycemia, without long-term current use of insulin (HCC) - Primary   Patient presents with a history of T2Dm with a prior A1c of 13.7 in July 2024.  Patient's A1c today is 8.5.  They are on  a regimen of metformin 1000 mg daily and 25 units of Lantus daily. Patient denies hypoglycemia.  His fasting blood glucose levels this morning was 135.  He has not been following with diabetes/nutritionist Dillon Wang. Plan: -Continue regimen  of Lantus 25 units daily and will increase metformin to 1500 mg for 1 week and then 2000 mg the following week -A1c today -Ophthalmology referral placed  -Urine ACR collected      Relevant Medications   rosuvastatin (CRESTOR) 40 MG tablet   metFORMIN (GLUCOPHAGE-XR) 500 MG 24 hr tablet   Insulin Pen Needle (PEN NEEDLES) 32G X 4 MM MISC   aspirin ([employer] ASPIRIN ADULT LOW DOSE) 81 MG chewable tablet   Other Relevant Orders   POC Hbg A1C (Completed)   Microalbumin / Creatinine Urine Ratio   Ambulatory referral to Ophthalmology   Basic metabolic panel (Completed)   Hyperlipidemia associated with type 2 diabetes mellitus (HCC)   Patient has a history of hyperlipidemia on a regimen of Crestor 40 mg.  His prior lipid profile was collected and February 2024 with an LDL of 31.  Plan: -Repeat lipid profile today -Will continue Crestor 40 mg      Relevant Medications   rosuvastatin (CRESTOR) 40 MG tablet   metFORMIN (GLUCOPHAGE-XR) 500 MG 24 hr tablet   aspirin ([employer] ASPIRIN ADULT LOW DOSE) 81 MG chewable tablet     Other   Healthcare maintenance   Patient received influenza and Prevnar vaccines.  Referral placed for colonoscopy for screening for colon cancer.      Dyspnea   Patient presents to clinic with 1 month of increasing shortness of breath, wheezing, increased cough at night, nonproductive cough.  Patient reports increased orthopnea as well.  He stated that his shortness of breath worsened after he ran out of both albuterol and Symbicort.  He is still smoking cigarettes but is down to 1 pack a day.  Physical exam was remarkable for weight increase of 6 pounds since his last visit, bibasilar crackles, and lower extremity pitting edema up to the level of  the mid shin.  I suspect that patient's dyspnea is due to volume overload as well as medication noncompliance with his inhalers. Plan: -Increase Lasix to 60mg  transiently -BNP ordered -Both inhalers were refilled -Patient was encouraged to follow-up with his PFT testing      Relevant Orders   Brain natriuretic peptide (Completed)   Encounter for screening involving social determinants of health [facility])   Patient has not followed up with the clinic since September, it appears that patient has a history of numerous no-shows in between appointments.  Upon further questioning, patient reported that he has lack of transportation and this is inhibiting him from making his appointments. Plan -Will consult social work for assistance with transportation for appointments.      Relevant Orders   Ambulatory referral to Social Work   Other Visit Diagnoses       Type 2 diabetes mellitus without complication, without long-term current use of insulin (HCC)       Relevant Medications   rosuvastatin (CRESTOR) 40 MG tablet   metFORMIN (GLUCOPHAGE-XR) 500 MG 24 hr tablet   aspirin ([employer] ASPIRIN ADULT LOW DOSE) 81 MG chewable tablet     Mixed hyperlipidemia       Relevant Medications   rosuvastatin (CRESTOR) 40 MG tablet   aspirin ([employer] ASPIRIN ADULT LOW DOSE) 81 MG chewable tablet   Other Relevant Orders   Lipid Profile (Completed)     Uncomplicated asthma, unspecified asthma severity, unspecified whether persistent       Relevant Medications   albuterol (VENTOLIN HFA) 108 (90 Base) MCG/ACT inhaler   budesonide-formoterol (SYMBICORT) 160-4.5 MCG/ACT inhaler     Encounter for [employer]  pneumococcal vaccination       Relevant Orders   Pneumococcal conjugate vaccine 20-valent (Prevnar 20) (Completed)     Screening for colon cancer       Relevant Orders   Ambulatory referral to Gastroenterology     Encounter for immunization       Relevant Orders   Flu vaccine trivalent PF, 6mos and  older(Flulaval,Afluria,Fluarix,Fluzone) (Completed)         Return in about 2 weeks (around 04/13/2023) for Dyspnea f/u .    Faith Rogue, DO

## 2023-03-31 DIAGNOSIS — Z139 Encounter for screening, unspecified: Secondary | ICD-10-CM | POA: Insufficient documentation

## 2023-03-31 LAB — MICROALBUMIN / CREATININE URINE RATIO
Creatinine, Urine: 70.8 mg/dL
Microalb/Creat Ratio: <4 mg/g{creat} (ref 0–29)
Microalbumin, Urine: <3 ug/mL

## 2023-03-31 LAB — LIPID PANEL
Chol/HDL Ratio: 7.1 {ratio} — ABNORMAL HIGH (ref 0.0–5.0)
Cholesterol, Total: 220 mg/dL — ABNORMAL HIGH (ref 100–199)
HDL: 31 mg/dL — ABNORMAL LOW (ref >39)
LDL Chol Calc (NIH): 129 mg/dL — ABNORMAL HIGH (ref 0–99)
Triglycerides: 332 mg/dL — ABNORMAL HIGH (ref 0–149)
VLDL Cholesterol Cal: 60 mg/dL — ABNORMAL HIGH (ref 5–40)

## 2023-03-31 LAB — BASIC METABOLIC PANEL
BUN/Creatinine Ratio: 15 (ref 9–20)
BUN: 17 mg/dL (ref 6–24)
CO2: 19 mmol/L — ABNORMAL LOW (ref 20–29)
Calcium: 9.3 mg/dL (ref 8.7–10.2)
Chloride: 102 mmol/L (ref 96–106)
Creatinine, Ser: 1.15 mg/dL (ref 0.76–1.27)
Glucose: 302 mg/dL — ABNORMAL HIGH (ref 70–99)
Potassium: 4.5 mmol/L (ref 3.5–5.2)
Sodium: 137 mmol/L (ref 134–144)
eGFR: 75 mL/min/{1.73_m2} (ref >59)

## 2023-03-31 NOTE — Assessment & Plan Note (Signed)
Patient received influenza and Prevnar vaccines.  Referral placed for colonoscopy for screening for colon cancer.

## 2023-03-31 NOTE — Assessment & Plan Note (Signed)
Patient presents to clinic with 1 month of increasing shortness of breath, wheezing, increased cough at night, nonproductive cough.  Patient reports increased orthopnea as well.  He stated that his shortness of breath worsened after he ran out of both albuterol and Symbicort.  He is still smoking cigarettes but is down to 1 pack a day.  Physical exam was remarkable for weight increase of 6 pounds since his last visit, bibasilar crackles, and lower extremity pitting edema up to the level of the mid shin.  I suspect that patient's dyspnea is due to volume overload as well as medication noncompliance with his inhalers. Plan: -Increase Lasix to 60mg  transiently -BNP ordered -Both inhalers were refilled -Patient was encouraged to follow-up with his PFT testing

## 2023-03-31 NOTE — Assessment & Plan Note (Signed)
Patient presents with a history of hypertension with a blood pressure today of 139/78. Their hypertension is controlled on a regimen of amlodipine 10 mg, Coreg 12.5 mg daily, and losartan 100 mg daily.   Plan: -Continue current regimen [id number] 10 mg, Coreg 12.5 mg daily and , losartan 100 mg daily -BMP today

## 2023-03-31 NOTE — Assessment & Plan Note (Signed)
Patient has a history of hyperlipidemia on a regimen of Crestor 40 mg.  His prior lipid profile was collected and February 2024 with an LDL of 31.  Plan: -Repeat lipid profile today -Will continue Crestor 40 mg

## 2023-03-31 NOTE — Assessment & Plan Note (Addendum)
Patient presents with a history of T2Dm with a prior A1c of 13.7 in July 2024.  Patient's A1c today is 8.5.  They are on a regimen of metformin 1000 mg daily and 25 units of Lantus daily. Patient denies hypoglycemia.  His fasting blood glucose levels this morning was 135.  He has not been following with diabetes/nutritionist Lupita Leash. Plan: -Continue regimen of Lantus 25 units daily and will increase metformin to 1500 mg for 1 week and then 2000 mg the following week -A1c today -Ophthalmology referral placed  -Urine ACR collected

## 2023-03-31 NOTE — Assessment & Plan Note (Signed)
Patient has not followed up with the clinic since September, it appears that patient has a history of numerous no-shows in between appointments.  Upon further questioning, patient reported that he has lack of transportation and this is inhibiting him from making his appointments. Plan -Will consult social work for assistance with transportation for appointments.

## 2023-04-01 ENCOUNTER — Telehealth: Payer: Self-pay

## 2023-04-01 NOTE — Telephone Encounter (Signed)
Requesting lab results, please call pt back.

## 2023-04-02 NOTE — Progress Notes (Signed)
Internal Medicine Clinic Attending  Case discussed with the resident at the time of the visit.  We reviewed the resident's history and exam and pertinent patient test results.  I agree with the assessment, diagnosis, and plan of care documented in the resident's note.

## 2023-04-06 ENCOUNTER — Other Ambulatory Visit: Payer: Self-pay | Admitting: Student

## 2023-04-06 DIAGNOSIS — Z139 Encounter for screening, unspecified: Secondary | ICD-10-CM

## 2023-04-06 MED ORDER — EZETIMIBE 10 MG PO TABS: 10.0000 mg | ORAL_TABLET | Freq: Every day | ORAL | 3 refills | Status: AC

## 2023-04-07 NOTE — Progress Notes (Signed)
 Internal Medicine Clinic Attending  Case discussed with the resident at the time of the visit.  We reviewed the resident's history and exam and pertinent patient test results.  I agree with the assessment, diagnosis, and plan of care documented in the resident's note.

## 2023-04-10 ENCOUNTER — Telehealth: Payer: Self-pay

## 2023-04-10 NOTE — Progress Notes (Signed)
   Telephone encounter was:  Successful.  Complex Care Management Note Care Guide Note  04/10/2023 Name: Mancel Lardizabal MRN: [id number] DOB: Nov 08, 1965  Mahdi Frye is a 58 y.o. year old male who is a primary care patient of Kandis Perkins, DO . The community resource team was consulted for assistance with Transportation Needs   SDOH screenings and interventions completed:  Yes  Social Drivers of Health From This Encounter   Transportation Needs: No Transportation Needs (04/10/2023)   PRAPARE - Administrator, Civil Service (Medical): No    Lack of Transportation (Non-Medical): No    SDOH Interventions Today    Flowsheet Row Most Recent Value  SDOH Interventions   Transportation Interventions Community Resources Provided        Care guide performed the following interventions: Patient provided with information about care guide support team and interviewed to confirm resource needs.[employer] has transportation benefits with his medicaid. As requested I will be mailing [facility] transportation resources to the patient   Follow Up Plan:  No further follow up planned at this time. The patient has been provided with needed resources.  Encounter Outcome:  Patient Visit Completed    [facility]  [facility] Guide, Phone: [phone] Fax: [phone] Website: [email or web address]

## 2023-04-13 ENCOUNTER — Encounter: Payer: Medicaid Other | Admitting: Student

## 2023-04-24 ENCOUNTER — Other Ambulatory Visit: Payer: Self-pay | Admitting: Student

## 2023-04-24 DIAGNOSIS — E119 Type 2 diabetes mellitus without complications: Secondary | ICD-10-CM

## 2023-05-31 ENCOUNTER — Other Ambulatory Visit: Payer: Self-pay | Admitting: Cardiology

## 2023-05-31 DIAGNOSIS — I252 Old myocardial infarction: Secondary | ICD-10-CM

## 2023-06-22 ENCOUNTER — Ambulatory Visit: Payer: Self-pay | Admitting: Student

## 2023-06-22 NOTE — Progress Notes (Deleted)
   Established Patient Office Visit  Subjective   Patient ID: Dillon Wang, male    DOB: 1965-06-18  Age: 58 y.o. MRN: [id number]  No chief complaint on file.   Dillon Wang is a 58 y.o. who presents to the clinic for ***. Please see problem based assessment and plan for additional details.  Patient Active Problem List   Diagnosis Date Noted   Encounter for screening involving social determinants of health (SDoH) 03/31/2023   (HFpEF) heart failure with preserved ejection fraction (HCC) 09/09/2022   Coronary artery disease of native artery of native heart with stable angina pectoris (HCC) 02/07/2022   Dyspnea 02/07/2022   Seizures (HCC) 12/26/2021   Healthcare maintenance 12/26/2021   History of non-ST elevation myocardial infarction (NSTEMI) 11/06/2021   Hyperlipidemia associated with type 2 diabetes mellitus (HCC) 11/06/2021   Type 2 diabetes mellitus with hyperglycemia, without long-term current use of insulin  (HCC) 09/20/2018   Obstructive sleep apnea 09/20/2017   Morbid obesity (HCC) 09/20/2017   Hypertension    Cocaine abuse (HCC)    Tobacco use disorder    Alcohol abuse       Objective:     There were no vitals taken for this visit. BP Readings from Last 3 Encounters:  03/30/23 139/78  11/20/22 (!) 150/97  10/20/22 134/81   Wt Readings from Last 3 Encounters:  03/30/23 276 lb 1.6 oz (125.2 kg)  11/20/22 270 lb 3.2 oz (122.6 kg)  10/20/22 261 lb 8 oz (118.6 kg)      Physical Exam   No results found for any visits on 06/22/23.  Last metabolic panel Lab Results  Component Value Date   GLUCOSE 302 (H) 03/30/2023   NA 137 03/30/2023   K 4.5 03/30/2023   CL 102 03/30/2023   CO2 19 (L) 03/30/2023   BUN 17 03/30/2023   CREATININE 1.15 03/30/2023   EGFR 75 03/30/2023   CALCIUM  9.3 03/30/2023   PHOS 2.9 02/21/2018   PROT 6.9 10/27/2021   ALBUMIN 3.6 10/27/2021   LABGLOB 2.6 01/12/2018   AGRATIO 1.9 01/12/2018   BILITOT 0.5 10/27/2021   ALKPHOS 67  10/27/2021   AST 23 10/27/2021   ALT 22 10/27/2021   ANIONGAP 11 10/29/2021   Last lipids Lab Results  Component Value Date   CHOL 220 (H) 03/30/2023   HDL 31 (L) 03/30/2023   LDLCALC 129 (H) 03/30/2023   TRIG 332 (H) 03/30/2023   CHOLHDL 7.1 (H) 03/30/2023   Last hemoglobin A1c Lab Results  Component Value Date   HGBA1C 8.5 (A) 03/30/2023      The ASCVD Risk score (Arnett DK, et al., 2019) failed to calculate for the following reasons:   Risk score cannot be calculated because patient has a medical history suggesting prior/existing ASCVD    Assessment & Plan:   Problem List Items Addressed This Visit   None   No follow-ups on file.    Aurora Lees, DO

## 2023-07-13 NOTE — Progress Notes (Deleted)
 CC: follow up  HPI:  [name] is a 58 y.o. with medical history of HTN, HLD, DMII, HFpEF, tobacco use disorder presenting to [facility] for a follow up appointment. Was seen in 03/2023 for follow up appointment and had dyspnea for he was to follow up in 2 weeks.   Please see problem-based list for further details, assessments, and plans.  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   metFORMIN  (GLUCOPHAGE -XR) 500 MG 24 hr tablet, TAKE 3 TABS BY MOUTH DAILY W/BREAKFAST. AFTER 1 WEEK, INCREASE DOSE TO 4 PILLS A DAY FOR 2,000MG /DAY  Current Outpatient Medications (Cardiovascular):    amLODipine  (NORVASC ) 10 MG tablet, TAKE 1 TABLET BY MOUTH EVERY DAY   carvedilol  (COREG ) 12.5 MG tablet, Take 1 tablet (12.5 mg total) by mouth daily.   Evolocumab  (REPATHA  SURECLICK) 140 MG/ML SOAJ, Inject 140 mg into the skin every 14 (fourteen) days.   ezetimibe  (ZETIA ) 10 MG tablet, Take 1 tablet (10 mg total) by mouth daily.   furosemide  (LASIX ) 40 MG tablet, TAKE 1 TABLET BY MOUTH TWICE A DAY   losartan  (COZAAR ) 100 MG tablet, Take 1 tablet (100 mg total) by mouth daily.   nitroGLYCERIN  (NITROSTAT ) 0.4 MG SL tablet, Place 1 tablet (0.4 mg total) under the tongue every 5 (five) minutes as needed for chest pain.   rosuvastatin  (CRESTOR ) 40 MG tablet, Take  1 tablet (40 mg total) by mouth daily.  Current Outpatient Medications (Respiratory):    albuterol  (VENTOLIN  HFA) 108 (90 Base) MCG/ACT inhaler, Inhale 2 puffs into the lungs every 4 (four) hours as needed for wheezing or shortness of breath.   budesonide -formoterol  (SYMBICORT ) 160-4.5 MCG/ACT inhaler, Inhale 2 puffs into the lungs 2 (two) times daily.  Current Outpatient Medications (Analgesics):    aspirin  ([employer] ASPIRIN  ADULT LOW DOSE) 81 MG chewable tablet, Chew 1 tablet (81 mg total) by mouth daily. Please schedule appointment with cardiology.   Current Outpatient Medications (Other):    blood glucose meter kit and supplies KIT, Inject 1 each into the skin as directed. Dispense based on patient and insurance preference. Check blood sugar daily prior to eating breakfast.   glucose blood (ONETOUCH VERIO) test strip, Use as instructed   Insulin  Pen Needle (PEN NEEDLES) 32G X 4 MM MISC, 1 Needle by Does not apply route at bedtime.   Lancets (ONETOUCH DELICA PLUS LANCET30G) MISC, USE TO CHECK BLOOD SUGAR DAILY PRIOR TO BREAKFAST   levETIRAcetam  (KEPPRA ) 750 MG tablet, TAKE 1 TABLET (750MG  TOTAL) BY MOUTH TWICE A DAY   Misc. Devices MISC, Auto bipap with Medium size [employer] Full Face Mask Simplus mask/ heated humidification. Autopap settings 15-20 cm H2O.   nicotine  polacrilex (NICORETTE ) 2 MG gum, TAKE 1 EACH (2 MG TOTAL) BY MOUTH AS NEEDED FOR SMOKING CESSATION.  Review of Systems:  Review of  system negative unless stated in the problem list or HPI.    Physical Exam:  There were no vitals filed for this visit. Physical Exam General: NAD HENT: NCAT Lungs: CTAB, no wheeze, rhonchi or rales.  Cardiovascular: Normal heart sounds, no r/m/g, 2+ pulses in all extremities. No LE edema Abdomen: No TTP, normal bowel sounds MSK: No asymmetry or muscle atrophy.  Skin: no lesions noted on exposed skin Neuro: Alert and oriented x4. CN grossly intact Psych: Normal mood and normal  affect   Assessment & Plan:   No problem-specific Assessment & Plan notes found for this encounter.   See Encounters Tab for problem based charting.  Patient Discussed with Dr. {NAMES:[id number]::"Guilloud","Hoffman","Mullen","Narendra","Vincent","Machen","Lau","Hatcher","Williams"} Jackolyn Masker, MD Tommas Fragmin. [facility], PGY-3   HTN Coreg  12.5 mg every day, amlodipine  10 mg every day, losartan  100 mg every day, and lasix  40 mg every day. BMP with normal renal fxn 4 months ago.   HLD On Crestor  40 mg every day, rephatha 140 mg q2weeks, zetia  10 mg every day. LDL 129. Repeat this visit.   DMII Last A1c 8.5. Improving from >12, Insulin  25 units nighlty and metformin  1000 mg BID.  Care Gaps A1c Eye exam Colonoscopy

## 2023-07-14 ENCOUNTER — Encounter: Admitting: Internal Medicine

## 2024-04-08 ENCOUNTER — Telehealth: Payer: Self-pay

## 2024-04-08 NOTE — Telephone Encounter (Signed)
"   Called pt  to  set up his over due  2wk f/u last appt was jan of 2025  with a  2wk f/u  all appt since then has been no show..... I left a vmail  for pt to  give me a call back to set his appt up and to do his COPD  questions  if he is still using his inhalers .SABRA Please  direct call to me ( ANN) .SABRA Thanks in advance  "
